# Patient Record
Sex: Female | Born: 1935 | Race: White | Hispanic: No | Marital: Married | Smoking: Never smoker
Health system: Southern US, Community
[De-identification: ages and names within clinical notes are randomized; demographics above are authoritative.]

## PROBLEM LIST (undated history)

## (undated) DIAGNOSIS — R55 Syncope and collapse: Secondary | ICD-10-CM

## (undated) DIAGNOSIS — C801 Malignant (primary) neoplasm, unspecified: Secondary | ICD-10-CM

## (undated) DIAGNOSIS — I4891 Unspecified atrial fibrillation: Secondary | ICD-10-CM

## (undated) DIAGNOSIS — I10 Essential (primary) hypertension: Secondary | ICD-10-CM

## (undated) DIAGNOSIS — E785 Hyperlipidemia, unspecified: Secondary | ICD-10-CM

## (undated) DIAGNOSIS — I2510 Atherosclerotic heart disease of native coronary artery without angina pectoris: Secondary | ICD-10-CM

## (undated) DIAGNOSIS — C50919 Malignant neoplasm of unspecified site of unspecified female breast: Secondary | ICD-10-CM

## (undated) HISTORY — DX: Malignant (primary) neoplasm, unspecified: C80.1

## (undated) HISTORY — DX: Unspecified atrial fibrillation: I48.91

## (undated) HISTORY — DX: Essential (primary) hypertension: I10

## (undated) HISTORY — DX: Syncope and collapse: R55

## (undated) HISTORY — DX: Malignant neoplasm of unspecified site of unspecified female breast: C50.919

## (undated) HISTORY — DX: Atherosclerotic heart disease of native coronary artery without angina pectoris: I25.10

## (undated) HISTORY — DX: Hyperlipidemia, unspecified: E78.5

---

## 1990-09-22 HISTORY — PX: BREAST LUMPECTOMY: SHX2

## 1994-09-22 HISTORY — PX: OTHER SURGICAL HISTORY: SHX169

## 2006-12-01 DIAGNOSIS — F411 Generalized anxiety disorder: Secondary | ICD-10-CM | POA: Diagnosis present

## 2008-01-09 ENCOUNTER — Emergency Department: Payer: Self-pay | Admitting: Emergency Medicine

## 2008-01-12 ENCOUNTER — Other Ambulatory Visit: Payer: Self-pay

## 2008-01-13 ENCOUNTER — Observation Stay: Payer: Self-pay | Admitting: Internal Medicine

## 2012-05-05 DIAGNOSIS — I482 Chronic atrial fibrillation, unspecified: Secondary | ICD-10-CM | POA: Diagnosis present

## 2012-05-05 DIAGNOSIS — I4891 Unspecified atrial fibrillation: Secondary | ICD-10-CM | POA: Diagnosis present

## 2012-07-05 ENCOUNTER — Ambulatory Visit: Payer: Self-pay | Admitting: Ophthalmology

## 2012-07-05 LAB — PROTIME-INR
INR: 1.8
Prothrombin Time: 20.9 secs — ABNORMAL HIGH (ref 11.5–14.7)

## 2012-07-12 ENCOUNTER — Ambulatory Visit: Payer: Self-pay | Admitting: Ophthalmology

## 2014-04-21 ENCOUNTER — Emergency Department: Payer: Self-pay | Admitting: Emergency Medicine

## 2014-11-27 ENCOUNTER — Encounter: Payer: Self-pay | Admitting: Cardiovascular Disease

## 2014-11-27 ENCOUNTER — Encounter (INDEPENDENT_AMBULATORY_CARE_PROVIDER_SITE_OTHER): Payer: Self-pay

## 2014-11-27 ENCOUNTER — Ambulatory Visit (INDEPENDENT_AMBULATORY_CARE_PROVIDER_SITE_OTHER): Payer: Medicare HMO | Admitting: Cardiovascular Disease

## 2014-11-27 ENCOUNTER — Ambulatory Visit: Payer: Self-pay | Admitting: Surgery

## 2014-11-27 VITALS — HR 59 | Ht 65.0 in | Wt 170.1 lb

## 2014-11-27 DIAGNOSIS — I4891 Unspecified atrial fibrillation: Secondary | ICD-10-CM

## 2014-11-27 DIAGNOSIS — T671XXD Heat syncope, subsequent encounter: Secondary | ICD-10-CM

## 2014-11-27 DIAGNOSIS — R42 Dizziness and giddiness: Secondary | ICD-10-CM

## 2014-11-27 NOTE — Patient Instructions (Addendum)
Your physician recommends that you schedule a follow-up appointment in:  12/14/14 with Dr. Acie Fredrickson   Your physician has requested that you have an echocardiogram. Echocardiography is a painless test that uses sound waves to create images of your heart. It provides your doctor with information about the size and shape of your heart and how well your heart's chambers and valves are working. This procedure takes approximately one hour. There are no restrictions for this procedure.   Your physician has recommended that you wear an event monitor. Event monitors are medical devices that record the heart's electrical activity. Doctors most often [place] these monitors to diagnose arrhythmias. Arrhythmias are problems with the speed or rhythm of the heartbeat. The monitor is a small, portable device. You can wear one while you do your normal daily activities. This is usually used to diagnose what is causing palpitations/syncope (passing out).

## 2014-11-27 NOTE — Progress Notes (Signed)
Cardiology Office Note   Date:  11/27/2014   ID:  Daisy Parks, DOB May 29, 1932, MRN [id number]  PCP:  Artist Beach, MD  Cardiologist:   Thayer Headings, MD   Chief Complaint  Patient presents with  . Atrial Fibrillation      History of Present Illness: Daisy Parks is a 81 y.o. female who presents for pre-op evaluation for right arm surgery.  She has paroxsymal atrial fib.  She gets very dizzy and has fallen several times.   She fell recently and broke her right arm .   She was found to have atrial fibrillation when she was in the ER .  Has been seen by cardiology in [place] at [facility].   She has occasional episodes of angina.  Has had stress testing in [facility].  Has had a heart cath several years ago.  Was found to have a 70% blockage somewhere.  She does not get any regular exercise. She occasionally walks around in [place] or around in a supermarket. She denies any PND orthopnea. She denies any leg swelling.  She complains of pain in her right arm due to her fracture.      Past Medical History  Diagnosis Date  . Hypertension   . Hyperlipidemia   . Syncope and collapse   . Coronary artery disease   . Atrial fibrillation   . Cancer   . Breast cancer 1192    LUMPECTOMY    Past Surgical History  Procedure Laterality Date  . Breast lumpectomy  1992    LEFT BREAST  . 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 atenolol (TENORMIN) 25 MG tablet Take 25 mg by mouth daily. TAKE 1/2- 1 WHOLE TABLET DAILY AS DIRECTED    . chlorthalidone (HYGROTON) 25 MG tablet Take 25 mg by mouth daily.    Marland Kitchen HYDROcodone-acetaminophen (VICODIN) 5-500 MG per tablet Take 1 tablet by mouth every 6 (six) hours as needed for pain. 5-325MG  DAILY PRN FOR PAIN    . PARoxetine (PAXIL) 20 MG tablet Take 20 mg by mouth daily.    . Potassium Chloride CR (MICRO-K) 8 MEQ CPCR capsule  CR Take 20 mEq by mouth daily.    Marland Kitchen warfarin (COUMADIN) 2.5 MG tablet Take 2.5 mg by mouth daily. ONE TABLET ON TUESDAYS AND TWO TABLETS EVERY OTHER DAY OF THE WEEK     No current facility-administered medications for this visit.    Allergies:   Review of patient's allergies indicates no known allergies.    Social History:  The patient  reports that she has never smoked. She does not have any smokeless tobacco history on file. She reports that she does not drink alcohol or use illicit drugs.   Family History:  The patient's family history includes CAD in her father; CVA in her father and mother; Heart attack in her sister; Hypertension in her brother.    ROS:  Please see the history of present illness.    Review of Systems: Constitutional:  denies fever, chills, diaphoresis, appetite change and fatigue.  HEENT: denies photophobia, eye pain, redness, hearing loss, ear pain, congestion, sore throat, rhinorrhea, sneezing, neck pain, neck stiffness and tinnitus.  Respiratory: denies SOB, DOE, cough,   Cardiovascular: admits to chest pain,  Gastrointestinal: denies nausea, vomiting, abdominal pain, diarrhea, constipation, blood in stool.  Genitourinary: denies dysuria, urgency, frequency, hematuria,  flank pain and difficulty urinating.  Musculoskeletal: denies  myalgias, back pain, joint swelling, arthralgias and gait problem.   Skin: denies pallor, rash and wound.  Neurological: denies dizziness, seizures, syncope, weakness, light-headedness, numbness and headaches.   Hematological: denies adenopathy, easy bruising, personal or family bleeding history.  Psychiatric/ Behavioral: denies suicidal ideation, mood changes, confusion, nervousness, sleep disturbance and agitation.       All other systems are reviewed and negative.    PHYSICAL EXAM: VS:  BP   Pulse 59  Ht 5\' 5"  (1.651 m)  Wt 170 lb 1.9 oz (77.166 kg)  BMI 28.31 kg/m2 , BMI Body mass index is 28.31 kg/(m^2).  We were not  able to get a BP. Her right arm is in a cast.  She has lymphedema in her left arm.  GEN: Well nourished, well developed, in no acute distress HEENT: normal Neck: no JVD, carotid bruits, or masses Cardiac: RRR; no murmurs, rubs, or gallops,no edema in her legs Respiratory:  clear to auscultation bilaterally, normal work of breathing GI: soft, nontender, nondistended, + BS MS: right arm is in a cast  Skin: warm and dry, no rash Neuro:  Strength and sensation are intact Psych: normal   EKG:  EKG is ordered today. The ekg ordered today demonstrates sinus brady at 59.  NS ST and T wave abnormality.    Recent Labs: No results found for requested labs within last 365 days.    Lipid Panel No results found for: CHOL, TRIG, HDL, CHOLHDL, VLDL, LDLCALC, LDLDIRECT    Wt Readings from Last 3 Encounters:  11/27/14 170 lb 1.9 oz (77.166 kg)      Other studies Reviewed: Additional studies/ records that were reviewed today include: . Review of the above records demonstrates:    ASSESSMENT AND PLAN:  1.  Preoperative assessment prior to arm surgery.  The patient has a history of moderate coronary artery disease. She told me that she has a 70% stenosis that has been treated medically. She has had several stress test. She has a history of paroxysmal atrial fibrillation and has had a monitor as well as an echocardiogram. All of these studies were performed at [facility] and I do not have access to the studies.  At this time there is no way that I can give an accurate preoperative assessment prior to her surgery. I can give the okay for the surgeons to performed surgery under a regional block which does not pose any additional cardiac risk. We'll have to assess her cardiac status at a later time.  2. Atrial fibrillation: The patient has a history of paroxysmal atrial fibrillation. She is in  sinus rhythm today - sinus bradycardia. She's had multiple episodes of falling and wonders whether  or not she is going into atrial fibrillation which results in an episode of syncope. When she has been seen in the emergency room following these falls, she is frequently in atrial fibrillation. She's had monitors and an echocardiogram performed through the [facility] medical system but I do not have records for that. We will schedule her for an echocardiogram  for further assessment.  She will eventually need a 30 day event monitor. We will probably need to get the electrophysiology team involved.  3. History of coronary artery disease: The patient has had a cardiac catheterization in the past. She told me that showed a 70% blockage that they were treating medically. She admits to having episodes of angina but cannot tell me when she  has episodes of chest pain. We'll need to consider doing further testing.    4.  Right arm fracture: The patient has been in a cast for the past 10 days. She is scheduled for surgery tomorrow morning. I've cleared her for surgery using a regional block. I have no way to assess her cardiac function or assess her preoperative risk for general anesthesia.   Current medicines are reviewed at length with the patient today.  The patient does not have concerns regarding medicines.  The following changes have been made:  no change   Disposition:   FU with me in several weeks.  I've given the okay for right arm surgery to be performed under regional block. I've ordered an echocardiogram. I've ordered a 30 day event monitor. I'll see her in the office in 2-3 weeks for follow-up visit.    Signed, Nahser, Wonda Cheng, MD  11/27/2014 4:44 PM    [facility] [address], [place], [place]  [address] Phone: [phone]; Fax: [phone]

## 2014-12-08 ENCOUNTER — Ambulatory Visit: Payer: Self-pay | Admitting: Cardiovascular Disease

## 2015-01-05 ENCOUNTER — Ambulatory Visit
Admit: 2015-01-05 | Disposition: A | Discharge status: Home / Self Care | Payer: Self-pay | Attending: Surgery | Admitting: Surgery

## 2015-01-09 NOTE — Op Note (Signed)
PATIENT NAME:  Daisy Parks, Daisy Parks MR#:  [id number] DATE OF BIRTH:  1932/05/12  DATE OF PROCEDURE:  07/12/2012  PREOPERATIVE DIAGNOSIS:  Cataract, left eye.    POSTOPERATIVE DIAGNOSIS:  Cataract, left eye.  PROCEDURE PERFORMED:  Extracapsular cataract extraction using phacoemulsification with placement of an Alcon [id number], 22.5-diopter posterior chamber lens, serial # [id number].   SURGEON:  Loura Back. Andreya Lacks, MD  ASSISTANT:  None.  ANESTHESIA:  4% lidocaine and 0.75% Marcaine in a 50/50 mixture with 10 units/mL of Hylenex added, given as a peribulbar.  ANESTHESIOLOGIST:  Dr. Andree Elk   COMPLICATIONS:  None.  ESTIMATED BLOOD LOSS:  Less than 1 mL.  DESCRIPTION OF PROCEDURE:  The patient was brought to the operating room and given a peribulbar block.  The patient was then prepped and draped in the usual fashion.  The vertical rectus muscles were imbricated using 5-0 silk sutures.  These sutures were then clamped to the sterile drapes as bridle sutures.  A limbal peritomy was performed extending two clock hours and hemostasis was obtained with cautery.  A partial thickness scleral groove was made at the surgical limbus and dissected anteriorly in a lamellar dissection using an Alcon crescent knife.  The anterior chamber was entered supero-temporally with a Superblade and through the lamellar dissection with a 2.6 mm keratome.  DisCoVisc was used to replace the aqueous and a continuous tear capsulorrhexis was carried out.  Hydrodissection and hydrodelineation were carried out with balanced salt and a 27 gauge canula.  The nucleus was rotated to confirm the effectiveness of the hydrodissection.  Phacoemulsification was carried out using a divide-and-conquer technique.  Total ultrasound time was 1 minute and 58 seconds with an average power of 22.7 percent, CDE 44.14.  Irrigation/aspiration was used to remove the residual cortex.  DisCoVisc was used to inflate the capsule and the internal incision  was enlarged to 3 mm with the crescent knife.  The intraocular lens was folded and inserted into the capsular bag using the AcrySert delivery system.  Irrigation/aspiration was used to remove the residual DisCoVisc.  Miostat was injected into the anterior chamber through the paracentesis track to inflate the anterior chamber and induce miosis.  The wound was checked for leaks and none were found. The conjunctiva was closed with cautery and the bridle sutures were removed.  Two drops of 0.3% Vigamox were placed on the eye.   An eye shield was placed on the eye.  The patient was discharged to the recovery room in good condition.  ____________________________ Loura Back Vineeth Fell, MD sad:drc D: 07/12/2012 13:33:19 ET T: 07/12/2012 13:47:34 ET JOB#: [id number]  cc: Remo Lipps A. Niajah Sipos, MD, <Dictator> Martie Lee MD ELECTRONICALLY SIGNED 07/19/2012 13:54

## 2016-09-04 ENCOUNTER — Emergency Department
Admission: EM | Admit: 2016-09-04 | Discharge: 2016-09-04 | Disposition: A | Discharge status: Home / Self Care | Payer: Medicare HMO | Attending: Emergency Medicine | Admitting: Emergency Medicine

## 2016-09-04 DIAGNOSIS — Z7901 Long term (current) use of anticoagulants: Secondary | ICD-10-CM | POA: Diagnosis not present

## 2016-09-04 DIAGNOSIS — I10 Essential (primary) hypertension: Secondary | ICD-10-CM | POA: Diagnosis not present

## 2016-09-04 DIAGNOSIS — I2510 Atherosclerotic heart disease of native coronary artery without angina pectoris: Secondary | ICD-10-CM | POA: Diagnosis not present

## 2016-09-04 DIAGNOSIS — R51 Headache: Secondary | ICD-10-CM | POA: Diagnosis present

## 2016-09-04 DIAGNOSIS — Z79899 Other long term (current) drug therapy: Secondary | ICD-10-CM | POA: Diagnosis not present

## 2016-09-04 DIAGNOSIS — Z853 Personal history of malignant neoplasm of breast: Secondary | ICD-10-CM | POA: Insufficient documentation

## 2016-09-04 DIAGNOSIS — Z5321 Procedure and treatment not carried out due to patient leaving prior to being seen by health care provider: Secondary | ICD-10-CM | POA: Insufficient documentation

## 2016-09-04 NOTE — ED Triage Notes (Signed)
Pt c/o headache and high blood pressure that began today. Pt taking BP medications, complaint with medications. Pt alert and oriented X4, active, cooperative, pt in NAD. RR even and unlabored, color WNL.

## 2019-04-25 ENCOUNTER — Other Ambulatory Visit: Payer: Self-pay | Admitting: Urology

## 2019-04-26 ENCOUNTER — Other Ambulatory Visit: Payer: Self-pay | Admitting: Urology

## 2019-04-26 DIAGNOSIS — N2889 Other specified disorders of kidney and ureter: Secondary | ICD-10-CM

## 2020-09-09 ENCOUNTER — Other Ambulatory Visit: Payer: Self-pay | Admitting: Nurse Practitioner

## 2020-09-09 DIAGNOSIS — U071 COVID-19: Secondary | ICD-10-CM

## 2020-09-09 NOTE — Progress Notes (Signed)
I connected by phone with Daisy Parks on 09/09/2020 at 2:38 PM to discuss the potential use of a new treatment for mild to moderate COVID-19 viral infection in non-hospitalized patients.  This patient is a 86 y.o. female that meets the FDA criteria for Emergency Use Authorization of COVID monoclonal antibody casirivimab/imdevimab, bamlanivimab/etesevimab, or sotrovimab.  Has a (+) direct SARS-CoV-2 viral test result  Has mild or moderate COVID-19   Is NOT hospitalized due to COVID-19  Is within 10 days of symptom onset  Has at least one of the high risk factor(s) for progression to severe COVID-19 and/or hospitalization as defined in EUA.  Specific high risk criteria : Older age (>/= 84 yo), BMI > 25 and Cardiovascular disease or hypertension   I have spoken and communicated the following to the patient or parent/caregiver regarding COVID monoclonal antibody treatment:  1. FDA has authorized the emergency use for the treatment of mild to moderate COVID-19 in adults and pediatric patients with positive results of direct SARS-CoV-2 viral testing who are 78 years of age and older weighing at least 40 kg, and who are at high risk for progressing to severe COVID-19 and/or hospitalization.  2. The significant known and potential risks and benefits of COVID monoclonal antibody, and the extent to which such potential risks and benefits are unknown.  3. Information on available alternative treatments and the risks and benefits of those alternatives, including clinical trials.  4. Patients treated with COVID monoclonal antibody should continue to self-isolate and use infection control measures (e.g., wear mask, isolate, social distance, avoid sharing personal items, clean and disinfect "high touch" surfaces, and frequent handwashing) according to CDC guidelines.   5. The patient or parent/caregiver has the option to accept or refuse COVID monoclonal antibody treatment.  After reviewing this  information with the patient, the patient has agreed to receive one of the available covid 19 monoclonal antibodies and will be provided an appropriate fact sheet prior to infusion. Jobe Gibbon, NP 09/09/2020 2:38 PM

## 2020-09-11 ENCOUNTER — Ambulatory Visit (HOSPITAL_COMMUNITY)
Admission: RE | Admit: 2020-09-11 | Discharge: 2020-09-11 | Disposition: A | Discharge status: Home / Self Care | Payer: Medicare Other | Source: Ambulatory Visit | Attending: Pulmonary Disease | Admitting: Pulmonary Disease

## 2020-09-11 DIAGNOSIS — U071 COVID-19: Secondary | ICD-10-CM | POA: Diagnosis present

## 2020-09-11 DIAGNOSIS — Z23 Encounter for immunization: Secondary | ICD-10-CM | POA: Diagnosis not present

## 2020-09-11 MED ORDER — METHYLPREDNISOLONE SODIUM SUCC 125 MG IJ SOLR: 125.0000 mg | Freq: Once | INTRAMUSCULAR | Status: DC | PRN

## 2020-09-11 MED ORDER — EPINEPHRINE 0.3 MG/0.3ML IJ SOAJ: 0.3000 mg | Freq: Once | INTRAMUSCULAR | Status: DC | PRN

## 2020-09-11 MED ORDER — SODIUM CHLORIDE 0.9 % IV SOLN: INTRAVENOUS | Status: DC | PRN

## 2020-09-11 MED ORDER — FAMOTIDINE IN NACL 20-0.9 MG/50ML-% IV SOLN: 20.0000 mg | Freq: Once | INTRAVENOUS | Status: DC | PRN

## 2020-09-11 MED ORDER — DIPHENHYDRAMINE HCL 50 MG/ML IJ SOLN: 50.0000 mg | Freq: Once | INTRAMUSCULAR | Status: DC | PRN

## 2020-09-11 MED ORDER — ALBUTEROL SULFATE HFA 108 (90 BASE) MCG/ACT IN AERS: 2.0000 | INHALATION_SPRAY | Freq: Once | RESPIRATORY_TRACT | Status: DC | PRN

## 2020-09-11 MED ORDER — SODIUM CHLORIDE 0.9 % IV SOLN: Freq: Once | INTRAVENOUS | Status: AC

## 2020-09-11 NOTE — Progress Notes (Signed)
  Diagnosis: COVID-19  Physician: Dr. Wright  Procedure: Cov[facility] Infusion Clinic Med: bamlanivimab\etesevimab infusion - Provided patient with bamlanimivab\etesevimab fact sheet for patients, parents and caregivers prior to infusion.  Complications: No immediate complications noted.  Discharge: Discharged home   Costantino Kohlbeck R Indea Dearman 09/11/2020    

## 2020-09-11 NOTE — Progress Notes (Signed)
Patient reviewed Fact Sheet for Patients, Parents, and Caregivers for Emergency Use Authorization (EUA) of bamlanivimab and etesevimab for the Treatment of Coronavirus. Patient also reviewed and is agreeable to the estimated cost of treatment. Patient is agreeable to proceed.   

## 2020-09-11 NOTE — Discharge Instructions (Signed)
If you have any questions or concerns after the infusion please call the [facility] Provider on call at [phone]. This number is ONLY intended for your use regarding questions or concerns about the infusion post-treatment side-effects.  Please do not provide this number to others for use. For return to work notes please contact your primary care provider.   If someone you know is interested in receiving treatment please have them call the [place] hotline at [phone].   What types of side effects do monoclonal antibody drugs cause?  Common side effects  In general, the more common side effects caused by monoclonal antibody drugs include: . Allergic reactions, such as hives or itching . Flu-like signs and symptoms, including chills, fatigue, fever, and muscle aches and pains . Nausea, vomiting . Diarrhea . Skin rashes . Low blood pressure   The CDC is recommending patients who receive monoclonal antibody treatments wait at least 90 days before being vaccinated.  Currently, there are no data on the safety and efficacy of mRNA COVID-19 vaccines in persons who received monoclonal antibodies or convalescent plasma as part of COVID-19 treatment. Based on the estimated half-life of such therapies as well as evidence suggesting that reinfection is uncommon in the 90 days after initial infection, vaccination should be deferred for at least 90 days, as a precautionary measure until additional information becomes available, to avoid interference of the antibody treatment with vaccine-induced immune responses.   10 Things You Can Do to Manage Your COVID-19 Symptoms at Home If you have possible or confirmed COVID-19: 1. Stay home from work and school. And stay away from other public places. If you must go out, avoid using any kind of public transportation, ridesharing, or taxis. 2. Monitor your symptoms carefully. If your symptoms get worse, call your healthcare provider immediately. 3. Get  rest and stay hydrated. 4. If you have a medical appointment, call the healthcare provider ahead of time and tell them that you have or may have COVID-19. 5. For medical emergencies, call 911 and notify the dispatch personnel that you have or may have COVID-19. 6. Cover your cough and sneezes with a tissue or use the inside of your elbow. 7. Wash your hands often with soap and water for at least 20 seconds or clean your hands with an alcohol-based hand sanitizer that contains at least 60% alcohol. 8. As much as possible, stay in a specific room and away from other people in your home. Also, you should use a separate bathroom, if available. If you need to be around other people in or outside of the home, wear a mask. 9. Avoid sharing personal items with other people in your household, like dishes, towels, and bedding. 10. Clean all surfaces that are touched often, like counters, tabletops, and doorknobs. Use household cleaning sprays or wipes according to the label instructions. [email or web address] 03/23/2019 This information is not intended to replace advice given to you by your health care provider. Make sure you discuss any questions you have with your health care provider. Document Revised: 08/25/2019 Document Reviewed: 08/25/2019 Elsevier Patient Education  [place].

## 2020-09-19 ENCOUNTER — Emergency Department: Payer: Medicare HMO

## 2020-09-19 ENCOUNTER — Other Ambulatory Visit: Payer: Self-pay

## 2020-09-19 ENCOUNTER — Emergency Department
Admission: EM | Admit: 2020-09-19 | Discharge: 2020-09-19 | Disposition: A | Discharge status: Home / Self Care | Payer: Medicare HMO | Attending: Emergency Medicine | Admitting: Emergency Medicine

## 2020-09-19 DIAGNOSIS — Z7901 Long term (current) use of anticoagulants: Secondary | ICD-10-CM | POA: Diagnosis not present

## 2020-09-19 DIAGNOSIS — Z853 Personal history of malignant neoplasm of breast: Secondary | ICD-10-CM | POA: Insufficient documentation

## 2020-09-19 DIAGNOSIS — Z79899 Other long term (current) drug therapy: Secondary | ICD-10-CM | POA: Diagnosis not present

## 2020-09-19 DIAGNOSIS — I10 Essential (primary) hypertension: Secondary | ICD-10-CM | POA: Diagnosis not present

## 2020-09-19 DIAGNOSIS — J189 Pneumonia, unspecified organism: Secondary | ICD-10-CM | POA: Diagnosis not present

## 2020-09-19 DIAGNOSIS — R42 Dizziness and giddiness: Secondary | ICD-10-CM | POA: Diagnosis not present

## 2020-09-19 DIAGNOSIS — I2510 Atherosclerotic heart disease of native coronary artery without angina pectoris: Secondary | ICD-10-CM | POA: Diagnosis not present

## 2020-09-19 DIAGNOSIS — R059 Cough, unspecified: Secondary | ICD-10-CM | POA: Diagnosis present

## 2020-09-19 LAB — CBC
HCT: 43.7 % (ref 36.0–46.0)
Hemoglobin: 14.9 g/dL (ref 12.0–15.0)
MCH: 30.5 pg (ref 26.0–34.0)
MCHC: 34.1 g/dL (ref 30.0–36.0)
MCV: 89.4 fL (ref 80.0–100.0)
Platelets: 363 10*3/uL (ref 150–400)
RBC: 4.89 MIL/uL (ref 3.87–5.11)
RDW: 12.2 % (ref 11.5–15.5)
WBC: 8.1 10*3/uL (ref 4.0–10.5)
nRBC: 0 % (ref 0.0–0.2)

## 2020-09-19 LAB — PROTIME-INR
INR: 2.7 — ABNORMAL HIGH (ref 0.8–1.2)
Prothrombin Time: 28 seconds — ABNORMAL HIGH (ref 11.4–15.2)

## 2020-09-19 LAB — BASIC METABOLIC PANEL
Anion gap: 10 (ref 5–15)
BUN: 11 mg/dL (ref 8–23)
CO2: 24 mmol/L (ref 22–32)
Calcium: 9.1 mg/dL (ref 8.9–10.3)
Chloride: 103 mmol/L (ref 98–111)
Creatinine, Ser: 0.71 mg/dL (ref 0.44–1.00)
GFR, Estimated: >60 mL/min (ref >60)
Glucose, Bld: 147 mg/dL — ABNORMAL HIGH (ref 70–99)
Potassium: 3.7 mmol/L (ref 3.5–5.1)
Sodium: 137 mmol/L (ref 135–145)

## 2020-09-19 MED ORDER — CEFDINIR 300 MG PO CAPS: 300.0000 mg | ORAL_CAPSULE | Freq: Two times a day (BID) | ORAL | 0 refills | Status: AC

## 2020-09-19 MED ORDER — AZITHROMYCIN 500 MG PO TABS: 500.0000 mg | ORAL_TABLET | Freq: Every day | ORAL | 0 refills | Status: AC

## 2020-09-19 NOTE — ED Provider Notes (Signed)
[facility] Emergency Department Provider Note   ____________________________________________    I have reviewed the triage vital signs and the nursing notes.   HISTORY  Chief Complaint Hypotension and Dizziness     HPI Daisy Parks is a 86 y.o. female who presents for evaluation because of a low blood pressure measurement this morning.  Patient reports she was feeling fatigued and also complains that she has had a cough for 1 to 2 days.  She checked her blood pressure and found her systolic blood pressure to be in the low 80s.  She denies chest pain to me.  No fevers or chills.  Recently recovered from COVID-19 1 to 2 weeks ago.  Has been vaccinated.  Past Medical History:  Diagnosis Date  . Atrial fibrillation ([place])   . Breast cancer ([place]) 1192   LUMPECTOMY  . Cancer ([place])   . Coronary artery disease   . Hyperlipidemia   . Hypertension   . Syncope and collapse     There are no problems to display for this patient.   Past Surgical History:  Procedure Laterality Date  . BREAST LUMPECTOMY  1992   LEFT BREAST  . 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 Authorizing Provider  azithromycin (ZITHROMAX) 500 MG tablet Take 1 tablet (500 mg total) by mouth daily for 7 days. Take 1 tablet daily for 3 days. 09/19/20 09/26/20 Yes Lavonia Drafts, MD  cefdinir (OMNICEF) 300 MG capsule Take 1 capsule (300 mg total) by mouth 2 (two) times daily for 7 days. 09/19/20 09/26/20 Yes Lavonia Drafts, MD  atenolol (TENORMIN) 25 MG tablet Take 25 mg by mouth daily. TAKE 1/2- 1 WHOLE TABLET DAILY AS DIRECTED    [provider]  chlorthalidone (HYGROTON) 25 MG tablet Take 25 mg by mouth daily.    [provider]  HYDROcodone-acetaminophen (VICODIN) 5-500 MG per tablet Take 1 tablet by mouth every 6 (six) hours as needed for pain.  5-325MG  DAILY PRN FOR PAIN    [provider]  PARoxetine (PAXIL) 20 MG tablet Take 20 mg by mouth daily.    [provider]  Potassium Chloride CR (MICRO-K) 8 MEQ CPCR capsule CR Take 20 mEq by mouth daily.    [provider]  warfarin (COUMADIN) 2.5 MG tablet Take 2.5 mg by mouth daily. ONE TABLET ON TUESDAYS AND TWO TABLETS EVERY OTHER DAY OF THE WEEK    [provider]     Allergies Patient has no known allergies.  Family History  Problem Relation Age of Onset  . CVA Mother   . CAD Father   . CVA Father   . Heart attack Sister   . Hypertension Brother     Social History Social History   Tobacco Use  . Smoking status: Never Smoker  . Smokeless tobacco: Never Used  Substance Use Topics  . Alcohol use: No    Alcohol/week: 0.0 standard drinks  . Drug use: No    Review of Systems  Constitutional: No fever/chills Eyes: No visual changes.  ENT: No sore throat. Cardiovascular: Denies chest pain. Respiratory: Mild cough Gastrointestinal: No abdominal pain.    Genitourinary: Negative for dysuria. Musculoskeletal: Negative for back pain. Skin: Negative for rash. Neurological: Negative for headaches   ____________________________________________   PHYSICAL EXAM:  VITAL SIGNS: ED Triage Vitals  Enc Vitals Group  BP 09/19/20 1017 111/66     Pulse Rate 09/19/20 1017 62     Resp 09/19/20 1017 16     Temp 09/19/20 1017 98.8 F (37.1 C)     Temp Source 09/19/20 1017 Oral     SpO2 09/19/20 1020 99 %     Weight 09/19/20 1018 73.5 kg (162 lb)     Height 09/19/20 1018 1.651 m (5\' 5" )     Head Circumference --      Peak Flow --      Pain Score 09/19/20 1018 5     Pain Loc --      Pain Edu? --      Excl. in [place]? --     Constitutional: Alert and oriented. No acute distress.   Nose: No congestion/rhinnorhea. Mouth/Throat: Mucous membranes are moist.    Cardiovascular: Normal rate, regular rhythm. Grossly normal heart sounds.   Good peripheral circulation. Respiratory: Normal respiratory effort.  No retractions. Lungs CTAB. Gastrointestinal: Soft and nontender. No distention.  No CVA tenderness.  Musculoskeletal: No lower extremity tenderness nor edema.  Warm and well perfused Neurologic:  Normal speech and language. No gross focal neurologic deficits are appreciated.  Skin:  Skin is warm, dry and intact. No rash noted. Psychiatric: Mood and affect are normal. Speech and behavior are normal.  ____________________________________________   LABS (all labs ordered are listed, but only abnormal results are displayed)  Labs Reviewed  BASIC METABOLIC PANEL - Abnormal; Notable for the following components:      Result Value   Glucose, Bld 147 (*)    All other components within normal limits  PROTIME-INR - Abnormal; Notable for the following components:   Prothrombin Time 28.0 (*)    INR 2.7 (*)    All other components within normal limits  CBC   ____________________________________________  EKG  ED ECG REPORT I, 09/21/20, the attending physician, personally viewed and interpreted this ECG.  Date: 09/19/2020  Rhythm: normal sinus rhythm QRS Axis: normal Intervals: normal ST/T Wave abnormalities: normal Narrative Interpretation: no evidence of acute ischemia  ____________________________________________  RADIOLOGY  Chest x-ray viewed by me, possible opacity ____________________________________________   PROCEDURES  Procedure(s) performed: No  Procedures   Critical Care performed: No ____________________________________________   INITIAL IMPRESSION / ASSESSMENT AND PLAN / ED COURSE  Pertinent labs & imaging results that were available during my care of the patient were reviewed by me and considered in my medical decision making (see chart for details).  Patient presents with complaints of low blood pressure this morning, blood pressures here in the emergency department are normal.   Lab work is reassuring, normal white blood cell count.  Heart rate normal, afebrile, respiratory rate and oxygenation normal.  Recently recovered from COVID-19.  Chest x-ray demonstrates mild focal airspace opacities in the right upper lung, question whether there is a residual from COVID-19 or new CAP.  Given description of new cough suspect no CAP.  She also describes some fatigue.  Will start on p.o. antibiotics.  Blood pressure has been rechecked multiple times and has been normal here in the emergency department.  Recommend focus on hydration, rest, strict return precautions of any worsening.  Patient and son agree with plan    ____________________________________________   FINAL CLINICAL IMPRESSION(S) / ED DIAGNOSES  Final diagnoses:  Dizziness  Community acquired pneumonia, unspecified laterality        Note:  This document was prepared using Dragon voice recognition software and may include unintentional dictation errors.  Lavonia Drafts, MD 09/19/20 1420

## 2020-09-19 NOTE — ED Triage Notes (Signed)
Pt states she has not been feeling well since yesterday and this morning she felt dizzy and checked her b/p at home 88/60.Marland Kitchen pt is a/ox4 at present. States she has a hx of a-fib but does not feel like its going fast..

## 2021-03-14 ENCOUNTER — Other Ambulatory Visit: Payer: Self-pay

## 2021-03-14 ENCOUNTER — Encounter: Payer: Self-pay | Admitting: Emergency Medicine

## 2021-03-14 ENCOUNTER — Emergency Department: Payer: Medicare HMO

## 2021-03-14 ENCOUNTER — Emergency Department
Admission: EM | Admit: 2021-03-14 | Discharge: 2021-03-14 | Disposition: A | Discharge status: Home / Self Care | Payer: Medicare HMO | Attending: Emergency Medicine | Admitting: Emergency Medicine

## 2021-03-14 DIAGNOSIS — Z853 Personal history of malignant neoplasm of breast: Secondary | ICD-10-CM | POA: Insufficient documentation

## 2021-03-14 DIAGNOSIS — I10 Essential (primary) hypertension: Secondary | ICD-10-CM | POA: Insufficient documentation

## 2021-03-14 DIAGNOSIS — Z79899 Other long term (current) drug therapy: Secondary | ICD-10-CM | POA: Insufficient documentation

## 2021-03-14 DIAGNOSIS — I2510 Atherosclerotic heart disease of native coronary artery without angina pectoris: Secondary | ICD-10-CM | POA: Diagnosis not present

## 2021-03-14 DIAGNOSIS — R42 Dizziness and giddiness: Secondary | ICD-10-CM | POA: Insufficient documentation

## 2021-03-14 LAB — COMPREHENSIVE METABOLIC PANEL
ALT: 20 U/L (ref 0–44)
AST: 23 U/L (ref 15–41)
Albumin: 4 g/dL (ref 3.5–5.0)
Alkaline Phosphatase: 80 U/L (ref 38–126)
Anion gap: 7 (ref 5–15)
BUN: 13 mg/dL (ref 8–23)
CO2: 25 mmol/L (ref 22–32)
Calcium: 9.1 mg/dL (ref 8.9–10.3)
Chloride: 103 mmol/L (ref 98–111)
Creatinine, Ser: 0.77 mg/dL (ref 0.44–1.00)
GFR, Estimated: >60 mL/min (ref >60)
Glucose, Bld: 151 mg/dL — ABNORMAL HIGH (ref 70–99)
Potassium: 3.6 mmol/L (ref 3.5–5.1)
Sodium: 135 mmol/L (ref 135–145)
Total Bilirubin: 0.6 mg/dL (ref 0.3–1.2)
Total Protein: 6.7 g/dL (ref 6.5–8.1)

## 2021-03-14 LAB — URINALYSIS, COMPLETE (UACMP) WITH MICROSCOPIC
Bilirubin Urine: NEGATIVE
Glucose, UA: NEGATIVE mg/dL
Hgb urine dipstick: NEGATIVE
Ketones, ur: NEGATIVE mg/dL
Nitrite: NEGATIVE
Protein, ur: NEGATIVE mg/dL
Specific Gravity, Urine: 1.005 (ref 1.005–1.030)
pH: 5 (ref 5.0–8.0)

## 2021-03-14 LAB — CBC
HCT: 43.5 % (ref 36.0–46.0)
Hemoglobin: 14.7 g/dL (ref 12.0–15.0)
MCH: 30.7 pg (ref 26.0–34.0)
MCHC: 33.8 g/dL (ref 30.0–36.0)
MCV: 90.8 fL (ref 80.0–100.0)
Platelets: 259 10*3/uL (ref 150–400)
RBC: 4.79 MIL/uL (ref 3.87–5.11)
RDW: 12.4 % (ref 11.5–15.5)
WBC: 11.2 10*3/uL — ABNORMAL HIGH (ref 4.0–10.5)
nRBC: 0 % (ref 0.0–0.2)

## 2021-03-14 LAB — TROPONIN I (HIGH SENSITIVITY): Troponin I (High Sensitivity): 4 ng/L (ref <18)

## 2021-03-14 MED ORDER — SODIUM CHLORIDE 0.9 % IV BOLUS
500.0000 mL | Freq: Once | INTRAVENOUS | Status: AC
  Administered 2021-03-14: 500 mL via INTRAVENOUS

## 2021-03-14 NOTE — ED Triage Notes (Signed)
Pt comes into the ED via POV c/o dizziness that started last night and some SHOB.  Pt denies any falls with the dizziness.  Pt currently has even and unlabored respirations.  Pt does admit to some increased fatigue as well.

## 2021-03-14 NOTE — ED Notes (Signed)
Patient returned from CT

## 2021-03-14 NOTE — ED Notes (Signed)
Patient transported to CT 

## 2021-03-14 NOTE — ED Provider Notes (Signed)
[facility] Emergency Department Provider Note   ____________________________________________    I have reviewed the triage vital signs and the nursing notes.   HISTORY  Chief Complaint Dizziness     HPI Daisy Parks is a 87 y.o. female with history of atrial fibrillation who presents with complaints of dizziness.  Patient reports that her blood pressure has been going up and down recently.  She does take sotalol for her atrial fibrillation, no change in dose recently.  No chest pain or palpitations reported.  Currently is feeling improved however earlier today had sensation of dizziness that was more vertiginous than lightheadedness.  No fevers or chills  Past Medical History:  Diagnosis Date   Atrial fibrillation ([place])    Breast cancer ([place]) 1192   LUMPECTOMY   Cancer ([place])    Coronary artery disease    Hyperlipidemia    Hypertension    Syncope and collapse     There are no problems to display for this patient.   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 Authorizing Provider  atenolol (TENORMIN) 25 MG tablet Take 25 mg by mouth daily. TAKE 1/2- 1 WHOLE TABLET DAILY AS DIRECTED    [provider]  chlorthalidone (HYGROTON) 25 MG tablet Take 25 mg by mouth daily.    [provider]  HYDROcodone-acetaminophen (VICODIN) 5-500 MG per tablet Take 1 tablet by mouth every 6 (six) hours as needed for pain. 5-325MG  DAILY PRN FOR PAIN    [provider]  PARoxetine (PAXIL) 20 MG tablet Take 20 mg by mouth daily.    [provider]  Potassium Chloride CR (MICRO-K) 8 MEQ CPCR capsule CR Take 20 mEq by mouth daily.    [provider]  warfarin (COUMADIN) 2.5 MG tablet Take 2.5 mg by mouth daily. ONE  TABLET ON TUESDAYS AND TWO TABLETS EVERY OTHER DAY OF THE WEEK    [provider]     Allergies Patient has no known allergies.  Family History  Problem Relation Age of Onset   CVA Mother    CAD Father    CVA Father    Heart attack Sister    Hypertension Brother     Social History Social History   Tobacco Use   Smoking status: Never   Smokeless tobacco: Never  Substance Use Topics   Alcohol use: No    Alcohol/week: 0.0 standard drinks   Drug use: No    Review of Systems  Constitutional: No fever/chills Eyes: No visual changes.  ENT: No sore throat. Cardiovascular: Denies chest pain. Respiratory: Denies shortness of breath. Gastrointestinal: No abdominal pain.  No nausea, no vomiting.   Genitourinary: Negative for dysuria. Musculoskeletal: Negative for back pain. Skin: Negative for rash. Neurological: Negative for headaches or weakness   ____________________________________________   PHYSICAL EXAM:  VITAL SIGNS: ED Triage Vitals  Enc Vitals Group     BP 03/14/21 1518 (!) 181/84     Pulse Rate 03/14/21 1518 66     Resp 03/14/21 1518 20     Temp 03/14/21 1518 (!) 97.4 F (36.3 C)     Temp Source 03/14/21 1518 Oral     SpO2 03/14/21 1518 97 %     Weight 03/14/21 1513  71.7 kg (158 lb)     Height 03/14/21 1513 1.651 m (5\' 5" )     Head Circumference --      Peak Flow --      Pain Score 03/14/21 1513 1     Pain Loc --      Pain Edu? --      Excl. in [place]? --     Constitutional: Alert and oriented. No acute distress. Pleasant and interactive  Nose: No congestion/rhinnorhea. Mouth/Throat: Mucous membranes are moist.   Neck:  Painless ROM Cardiovascular: Normal rate, regular rhythm. Grossly normal heart sounds.  Good peripheral circulation. Respiratory: Normal respiratory effort.  No retractions. Lungs CTAB. Gastrointestinal: Soft and nontender. No distention.  No CVA tenderness.  Musculoskeletal: No lower extremity tenderness nor edema.  Warm  and well perfused Neurologic:  Normal speech and language. No gross focal neurologic deficits are appreciated.  Skin:  Skin is warm, dry and intact. No rash noted. Psychiatric: Mood and affect are normal. Speech and behavior are normal.  ____________________________________________   LABS (all labs ordered are listed, but only abnormal results are displayed)  Labs Reviewed  CBC - Abnormal; Notable for the following components:      Result Value   WBC 11.2 (*)    All other components within normal limits  URINALYSIS, COMPLETE (UACMP) WITH MICROSCOPIC - Abnormal; Notable for the following components:   Color, Urine YELLOW (*)    APPearance CLEAR (*)    Leukocytes,Ua SMALL (*)    Bacteria, UA RARE (*)    All other components within normal limits  COMPREHENSIVE METABOLIC PANEL - Abnormal; Notable for the following components:   Glucose, Bld 151 (*)    All other components within normal limits  TROPONIN I (HIGH SENSITIVITY)   ____________________________________________  EKG  ED ECG REPORT I, Lavonia Drafts, the attending physician, personally viewed and interpreted this ECG.  Date: 03/14/2021  Rhythm: normal sinus rhythm QRS Axis: normal Intervals: normal ST/T Wave abnormalities:  nonspecific change Narrative Interpretation: no evidence of acute ischemia  ____________________________________________  RADIOLOGY  CT head reviewed by me, no acute abnormality ____________________________________________   PROCEDURES  Procedure(s) performed: No  Procedures   Critical Care performed: No ____________________________________________   INITIAL IMPRESSION / ASSESSMENT AND PLAN / ED COURSE  Pertinent labs & imaging results that were available during my care of the patient were reviewed by me and considered in my medical decision making (see chart for details).   Patient presents with dizziness as described above, improved here in the emergency department.  Exam is  overall reassuring, no neurodeficits.  Suspect episode of vertigo.  Could also be medication side effect however no new medications reported.  Lab work is reassuring, CT head unremarkable, EKG is unremarkable  Patient feeling better after IV fluids.  Appropriate for discharge at this time with outpatient follow-up    ____________________________________________   FINAL CLINICAL IMPRESSION(S) / ED DIAGNOSES  Final diagnoses:  Dizziness        Note:  This document was prepared using Dragon voice recognition software and may include unintentional dictation errors.    Lavonia Drafts, MD 03/14/21 1910

## 2021-03-14 NOTE — ED Notes (Signed)
Pt reports history of A Fib and states that she takes numerous medications for cardiac management. She reports intermittent dizziness today and near-syncope PTA. No recent falls, no injury. A/O x 4 on arrival. Ambulatory with cane/walking stick with no obvious impairment to balance or gait.

## 2022-02-07 ENCOUNTER — Emergency Department: Payer: Medicare HMO

## 2022-02-07 ENCOUNTER — Emergency Department
Admission: EM | Admit: 2022-02-07 | Discharge: 2022-02-07 | Disposition: A | Discharge status: Home / Self Care | Payer: Medicare HMO | Attending: Emergency Medicine | Admitting: Emergency Medicine

## 2022-02-07 ENCOUNTER — Encounter: Payer: Self-pay | Admitting: Emergency Medicine

## 2022-02-07 ENCOUNTER — Other Ambulatory Visit: Payer: Self-pay

## 2022-02-07 DIAGNOSIS — R0602 Shortness of breath: Secondary | ICD-10-CM | POA: Insufficient documentation

## 2022-02-07 DIAGNOSIS — R718 Other abnormality of red blood cells: Secondary | ICD-10-CM | POA: Diagnosis not present

## 2022-02-07 DIAGNOSIS — I2510 Atherosclerotic heart disease of native coronary artery without angina pectoris: Secondary | ICD-10-CM | POA: Insufficient documentation

## 2022-02-07 DIAGNOSIS — N3000 Acute cystitis without hematuria: Secondary | ICD-10-CM

## 2022-02-07 DIAGNOSIS — Z950 Presence of cardiac pacemaker: Secondary | ICD-10-CM | POA: Diagnosis not present

## 2022-02-07 DIAGNOSIS — Z853 Personal history of malignant neoplasm of breast: Secondary | ICD-10-CM | POA: Diagnosis not present

## 2022-02-07 DIAGNOSIS — R42 Dizziness and giddiness: Secondary | ICD-10-CM

## 2022-02-07 DIAGNOSIS — R110 Nausea: Secondary | ICD-10-CM | POA: Diagnosis not present

## 2022-02-07 DIAGNOSIS — R600 Localized edema: Secondary | ICD-10-CM | POA: Diagnosis not present

## 2022-02-07 DIAGNOSIS — Z7901 Long term (current) use of anticoagulants: Secondary | ICD-10-CM | POA: Diagnosis not present

## 2022-02-07 DIAGNOSIS — I10 Essential (primary) hypertension: Secondary | ICD-10-CM | POA: Insufficient documentation

## 2022-02-07 LAB — BASIC METABOLIC PANEL
Anion gap: 7 (ref 5–15)
BUN: 13 mg/dL (ref 8–23)
CO2: 29 mmol/L (ref 22–32)
Calcium: 9.2 mg/dL (ref 8.9–10.3)
Chloride: 104 mmol/L (ref 98–111)
Creatinine, Ser: 0.79 mg/dL (ref 0.44–1.00)
GFR, Estimated: >60 mL/min (ref >60)
Glucose, Bld: 114 mg/dL — ABNORMAL HIGH (ref 70–99)
Potassium: 3.6 mmol/L (ref 3.5–5.1)
Sodium: 140 mmol/L (ref 135–145)

## 2022-02-07 LAB — TROPONIN I (HIGH SENSITIVITY)
Troponin I (High Sensitivity): 6 ng/L (ref <18)
Troponin I (High Sensitivity): 6 ng/L (ref <18)

## 2022-02-07 LAB — URINALYSIS, ROUTINE W REFLEX MICROSCOPIC
Bilirubin Urine: NEGATIVE
Glucose, UA: NEGATIVE mg/dL
Hgb urine dipstick: NEGATIVE
Ketones, ur: NEGATIVE mg/dL
Nitrite: NEGATIVE
Protein, ur: NEGATIVE mg/dL
Specific Gravity, Urine: 1.01 (ref 1.005–1.030)
pH: 6 (ref 5.0–8.0)

## 2022-02-07 LAB — CBC
HCT: 49.9 % — ABNORMAL HIGH (ref 36.0–46.0)
Hemoglobin: 16.5 g/dL — ABNORMAL HIGH (ref 12.0–15.0)
MCH: 30.1 pg (ref 26.0–34.0)
MCHC: 33.1 g/dL (ref 30.0–36.0)
MCV: 90.9 fL (ref 80.0–100.0)
Platelets: 304 10*3/uL (ref 150–400)
RBC: 5.49 MIL/uL — ABNORMAL HIGH (ref 3.87–5.11)
RDW: 12.3 % (ref 11.5–15.5)
WBC: 10.5 10*3/uL (ref 4.0–10.5)
nRBC: 0 % (ref 0.0–0.2)

## 2022-02-07 LAB — PROTIME-INR
INR: 2 — ABNORMAL HIGH (ref 0.8–1.2)
Prothrombin Time: 22.7 seconds — ABNORMAL HIGH (ref 11.4–15.2)

## 2022-02-07 MED ORDER — SODIUM CHLORIDE 0.9 % IV BOLUS
500.0000 mL | Freq: Once | INTRAVENOUS | Status: AC
  Administered 2022-02-07: 500 mL via INTRAVENOUS

## 2022-02-07 NOTE — ED Provider Notes (Signed)
[facility] Provider Note    Event Date/Time   First MD Initiated Contact with Patient 02/07/22 1242     (approximate)   History   Dizziness   HPI  Daisy Parks is a 88 y.o. female with a history of atrial fibrillation on warfarin, CAD, hypertension, hyperlipidemia, and remote history of breast cancer who presents with dizziness and shortness of breath, acute onset yesterday evening and persisting today.  The patient states that she feels short of breath at rest and with exertion and states it feels somewhat similar to when she has had anxiety attacks before.  She denies associated chest pain, palpitations, fever, or cough.  She has had some leg swelling recently.  In addition she reports dizziness since last night described as feeling lightheaded, not as spinning or vertigo.  She denies associated headache.  She has had some nausea but no vomiting and denies abdominal pain or diarrhea.  The patient denies any recent changes in her blood pressure medications.  She does states she has had episodes like this before but less severe.  The family member reports that the patient was seen in the walk-in clinic, diagnosed with a UTI, and sent to the ED for further evaluation.   Physical Exam   Triage Vital Signs: ED Triage Vitals  Enc Vitals Group     BP 02/07/22 1040 105/63     Pulse Rate 02/07/22 1040 77     Resp 02/07/22 1040 20     Temp 02/07/22 1040 98.4 F (36.9 C)     Temp Source 02/07/22 1040 Oral     SpO2 02/07/22 1040 99 %     Weight 02/07/22 1041 150 lb (68 kg)     Height 02/07/22 1041 '5\' 5"'$  (1.651 m)     Head Circumference --      Peak Flow --      Pain Score 02/07/22 1041 4     Pain Loc --      Pain Edu? --      Excl. in [place]? --     Most recent vital signs: Vitals:   02/07/22 1040  BP: 105/63  Pulse: 77  Resp: 20  Temp: 98.4 F (36.9 C)  SpO2: 99%     General: Alert and oriented, well appearing for age, no distress.   CV:  Good  peripheral perfusion.  Normal heart sounds. Resp:  Normal effort.  Lungs CTAB. Abd:  Soft and nontender.  No distention.  Other:  EOMI.  PERRLA.  No facial droop.  Motor intact in all extremities.  Normal coordination.  Slightly dry mucous membranes.  Trace bilateral lower extremity edema.   ED Results / Procedures / Treatments   Labs (all labs ordered are listed, but only abnormal results are displayed) Labs Reviewed  BASIC METABOLIC PANEL - Abnormal; Notable for the following components:      Result Value   Glucose, Bld 114 (*)    All other components within normal limits  CBC - Abnormal; Notable for the following components:   RBC 5.49 (*)    Hemoglobin 16.5 (*)    HCT 49.9 (*)    All other components within normal limits  PROTIME-INR - Abnormal; Notable for the following components:   Prothrombin Time 22.7 (*)    INR 2.0 (*)    All other components within normal limits  URINALYSIS, ROUTINE W REFLEX MICROSCOPIC - Abnormal; Notable for the following components:   Color, Urine YELLOW (*)  APPearance HAZY (*)    Leukocytes,Ua MODERATE (*)    Bacteria, UA RARE (*)    All other components within normal limits  TROPONIN I (HIGH SENSITIVITY)  TROPONIN I (HIGH SENSITIVITY)     EKG  ED ECG REPORT I, Arta Silence, the attending physician, personally viewed and interpreted this ECG.  Date: 02/07/2022 EKG Time: 1046 Rate: 89 Rhythm: Atrial fibrillation QRS Axis: normal Intervals: Prolonged QTc ST/T Wave abnormalities: Nonspecific T wave abnormalities Narrative Interpretation: no evidence of acute ischemia; no significant change when compared to EKG of 03/14/2021    RADIOLOGY  Chest x-ray: I independently viewed and interpreted the images; there is no focal consolidation or edema  PROCEDURES:  Critical Care performed: No  Procedures   MEDICATIONS ORDERED IN ED: Medications  sodium chloride 0.9 % bolus 500 mL (0 mLs Intravenous Stopped 02/07/22 1523)      IMPRESSION / MDM / ASSESSMENT AND PLAN / ED COURSE  I reviewed the triage vital signs and the nursing notes.  88 year old female with PMH as noted above presents with some shortness of breath and lightheadedness since last night associated with nausea.  She also has some low back pain but no recent fall or trauma.  She apparently was seen in the walk-in clinic today, told she had a UTI, and sent to the ED.    I reviewed the past medical records and do not see any note from earlier today.  The patient most recent encounter was from 01/09/2022 for an interrogation of her pacemaker.  She had a cardiology visit on 12/20/2021 and has a history of somewhat labile blood pressures with occasional hypotension but was recommended to continue amlodipine and metoprolol at her current doses at that time.  She is on sotalol and warfarin for the atrial fibrillation.  On exam the patient is overall well-appearing.  Her vital signs are normal except for borderline low blood pressure.  Lungs are clear.  Neuro exam is nonfocal.  The physical exam is otherwise unremarkable.  Chest x-ray shows no acute findings.  EKG shows nonspecific T wave abnormalities unchanged from prior.  Differential diagnosis includes, but is not limited to, dehydration, orthostatic hypotension, side effect of her blood pressure medications, rapid atrial fibrillation which may have resolved, electrolyte abnormality, other metabolic disturbance, UTI.  I do not suspect pneumonia or other respiratory etiology given the negative chest x-ray, and lack of hypoxia or other acute symptoms.  I do not suspect ACS given the lack of chest pain and lack of EKG changes.  We will obtain basic labs, troponins x2, urinalysis, give fluids, check orthostatic vital signs, and reassess.  The patient is on the cardiac monitor to evaluate for evidence of arrhythmia and/or significant heart rate changes.  ----------------------------------------- 3:40 PM on  02/07/2022 -----------------------------------------  Troponins are negative x2.  Electrolytes are normal.  INR is therapeutic.  CBC shows no leukocytosis.  It does show elevated hemoglobin consistent with mild hemoconcentration.  Urinalysis confirms findings consistent with a UTI.  Per the patient and her family member they have already been prescribed an antibiotic from the outpatient clinic and it is ready to pick up at [facility].  The patient was mildly orthostatic but her blood pressure still remained in the normal range when she stood up.  She is feeling significantly better now after fluids.  She feels well to go home.  I did consider whether the patient may require admission due to her dizziness and the urinary tract infection, however at this time  given the overall reassuring lab work-up, stable vital signs, and the patient clinically feeling significantly better, she is appropriate for discharge home.  I gave the patient and her family member thorough return precautions and they expressed understanding  FINAL CLINICAL IMPRESSION(S) / ED DIAGNOSES   Final diagnoses:  Dizziness  Acute cystitis without hematuria     Rx / DC Orders   ED Discharge Orders     None        Note:  This document was prepared using Dragon voice recognition software and may include unintentional dictation errors.    Arta Silence, MD 02/07/22 [phone]

## 2022-02-07 NOTE — Discharge Instructions (Addendum)
Take the antibiotic that was prescribed to you at the walk-in.  Follow-up with your doctor next week.  Return to the emergency department for new, worsening, or persistent severe weakness or dizziness, difficulty breathing, vomiting, abdominal pain, flank pain, fever, or any other new or worsening symptoms that concern you.

## 2022-02-07 NOTE — ED Triage Notes (Signed)
Pt via POV from home. Pt c/o low BP, dizziness, SOB, and lower back pain that started last night. Denies any pain in her chest pain. Pt states she could not sleep because of this. Denies any pain in her chest pain. Pt takes Warfarin for her a fib. Pt is A&Ox4 and NAD.

## 2022-04-02 ENCOUNTER — Observation Stay
Admission: EM | Admit: 2022-04-02 | Discharge: 2022-04-03 | Disposition: A | Discharge status: Home / Self Care | Payer: Medicare HMO | Attending: Osteopathic Medicine | Admitting: Osteopathic Medicine

## 2022-04-02 ENCOUNTER — Other Ambulatory Visit: Payer: Self-pay

## 2022-04-02 ENCOUNTER — Emergency Department: Payer: Medicare HMO

## 2022-04-02 ENCOUNTER — Encounter: Payer: Self-pay | Admitting: Internal Medicine

## 2022-04-02 DIAGNOSIS — I4891 Unspecified atrial fibrillation: Principal | ICD-10-CM | POA: Insufficient documentation

## 2022-04-02 DIAGNOSIS — F411 Generalized anxiety disorder: Secondary | ICD-10-CM | POA: Diagnosis present

## 2022-04-02 DIAGNOSIS — R791 Abnormal coagulation profile: Secondary | ICD-10-CM | POA: Diagnosis not present

## 2022-04-02 DIAGNOSIS — R7989 Other specified abnormal findings of blood chemistry: Secondary | ICD-10-CM | POA: Diagnosis present

## 2022-04-02 DIAGNOSIS — I480 Paroxysmal atrial fibrillation: Secondary | ICD-10-CM | POA: Insufficient documentation

## 2022-04-02 DIAGNOSIS — I10 Essential (primary) hypertension: Secondary | ICD-10-CM | POA: Insufficient documentation

## 2022-04-02 DIAGNOSIS — F419 Anxiety disorder, unspecified: Secondary | ICD-10-CM | POA: Diagnosis present

## 2022-04-02 DIAGNOSIS — Z79899 Other long term (current) drug therapy: Secondary | ICD-10-CM | POA: Diagnosis not present

## 2022-04-02 DIAGNOSIS — R000 Tachycardia, unspecified: Secondary | ICD-10-CM | POA: Diagnosis present

## 2022-04-02 DIAGNOSIS — Z20822 Contact with and (suspected) exposure to covid-19: Secondary | ICD-10-CM | POA: Diagnosis not present

## 2022-04-02 DIAGNOSIS — R8281 Pyuria: Secondary | ICD-10-CM | POA: Diagnosis present

## 2022-04-02 DIAGNOSIS — I482 Chronic atrial fibrillation, unspecified: Secondary | ICD-10-CM | POA: Diagnosis present

## 2022-04-02 DIAGNOSIS — R002 Palpitations: Secondary | ICD-10-CM | POA: Diagnosis present

## 2022-04-02 DIAGNOSIS — R0602 Shortness of breath: Secondary | ICD-10-CM | POA: Insufficient documentation

## 2022-04-02 DIAGNOSIS — J810 Acute pulmonary edema: Secondary | ICD-10-CM | POA: Diagnosis not present

## 2022-04-02 LAB — COMPREHENSIVE METABOLIC PANEL
ALT: 21 U/L (ref 0–44)
AST: 23 U/L (ref 15–41)
Albumin: 4 g/dL (ref 3.5–5.0)
Alkaline Phosphatase: 70 U/L (ref 38–126)
Anion gap: 3 — ABNORMAL LOW (ref 5–15)
BUN: 11 mg/dL (ref 8–23)
CO2: 28 mmol/L (ref 22–32)
Calcium: 8.9 mg/dL (ref 8.9–10.3)
Chloride: 109 mmol/L (ref 98–111)
Creatinine, Ser: 0.88 mg/dL (ref 0.44–1.00)
GFR, Estimated: >60 mL/min (ref >60)
Glucose, Bld: 125 mg/dL — ABNORMAL HIGH (ref 70–99)
Potassium: 3.6 mmol/L (ref 3.5–5.1)
Sodium: 140 mmol/L (ref 135–145)
Total Bilirubin: 0.9 mg/dL (ref 0.3–1.2)
Total Protein: 7.2 g/dL (ref 6.5–8.1)

## 2022-04-02 LAB — CBC
HCT: 45.8 % (ref 36.0–46.0)
Hemoglobin: 14.7 g/dL (ref 12.0–15.0)
MCH: 30 pg (ref 26.0–34.0)
MCHC: 32.1 g/dL (ref 30.0–36.0)
MCV: 93.5 fL (ref 80.0–100.0)
Platelets: 298 10*3/uL (ref 150–400)
RBC: 4.9 MIL/uL (ref 3.87–5.11)
RDW: 12.7 % (ref 11.5–15.5)
WBC: 9.6 10*3/uL (ref 4.0–10.5)
nRBC: 0 % (ref 0.0–0.2)

## 2022-04-02 LAB — BRAIN NATRIURETIC PEPTIDE: B Natriuretic Peptide: 874.6 pg/mL — ABNORMAL HIGH (ref 0.0–100.0)

## 2022-04-02 LAB — PROTIME-INR
INR: 3.3 — ABNORMAL HIGH (ref 0.8–1.2)
INR: 3.1 — ABNORMAL HIGH (ref 0.8–1.2)
Prothrombin Time: 33.3 seconds — ABNORMAL HIGH (ref 11.4–15.2)
Prothrombin Time: 31.4 seconds — ABNORMAL HIGH (ref 11.4–15.2)

## 2022-04-02 LAB — PROCALCITONIN: Procalcitonin: <0.1 ng/mL

## 2022-04-02 LAB — TROPONIN I (HIGH SENSITIVITY)
Troponin I (High Sensitivity): 7 ng/L (ref <18)
Troponin I (High Sensitivity): 7 ng/L (ref <18)

## 2022-04-02 MED ORDER — ONDANSETRON HCL 4 MG/2ML IJ SOLN: 4.0000 mg | Freq: Four times a day (QID) | INTRAMUSCULAR | Status: DC | PRN

## 2022-04-02 MED ORDER — ACETAMINOPHEN 325 MG RE SUPP
650.0000 mg | Freq: Four times a day (QID) | RECTAL | Status: DC | PRN
Start: 2022-04-02 — End: 2022-04-03

## 2022-04-02 MED ORDER — SOTALOL HCL 80 MG PO TABS
160.0000 mg | ORAL_TABLET | Freq: Once | ORAL | Status: AC
  Administered 2022-04-02: 160 mg via ORAL
  Filled 2022-04-02: qty 2

## 2022-04-02 MED ORDER — METOPROLOL TARTRATE 5 MG/5ML IV SOLN
2.5000 mg | Freq: Once | INTRAVENOUS | Status: AC
  Administered 2022-04-02: 2.5 mg via INTRAVENOUS
  Filled 2022-04-02: qty 5

## 2022-04-02 MED ORDER — LABETALOL HCL 5 MG/ML IV SOLN
0.5000 mg/min | Status: DC
  Administered 2022-04-02 (×2): 0.5 mg/min via INTRAVENOUS
  Filled 2022-04-02 (×2): qty 80

## 2022-04-02 MED ORDER — SOTALOL HCL 80 MG PO TABS
160.0000 mg | ORAL_TABLET | Freq: Two times a day (BID) | ORAL | Status: DC
  Administered 2022-04-02 – 2022-04-03 (×2): 160 mg via ORAL
  Filled 2022-04-02 (×2): qty 2

## 2022-04-02 MED ORDER — MIRTAZAPINE 15 MG PO TABS
15.0000 mg | ORAL_TABLET | Freq: Every day | ORAL | Status: DC
  Administered 2022-04-02: 15 mg via ORAL
  Filled 2022-04-02: qty 1

## 2022-04-02 MED ORDER — WARFARIN - PHARMACIST DOSING INPATIENT
Freq: Every day | Status: DC
  Filled 2022-04-02: qty 1

## 2022-04-02 MED ORDER — AMLODIPINE BESYLATE 5 MG PO TABS
5.0000 mg | ORAL_TABLET | Freq: Every day | ORAL | Status: DC
  Administered 2022-04-02 – 2022-04-03 (×2): 5 mg via ORAL
  Filled 2022-04-02 (×2): qty 1

## 2022-04-02 MED ORDER — SOTALOL HCL 80 MG PO TABS: 160.0000 mg | ORAL_TABLET | Freq: Two times a day (BID) | ORAL | Status: DC

## 2022-04-02 MED ORDER — FUROSEMIDE 10 MG/ML IJ SOLN
40.0000 mg | Freq: Once | INTRAMUSCULAR | Status: AC
  Administered 2022-04-02: 40 mg via INTRAVENOUS
  Filled 2022-04-02: qty 4

## 2022-04-02 MED ORDER — HYDRALAZINE HCL 20 MG/ML IJ SOLN: 5.0000 mg | Freq: Four times a day (QID) | INTRAMUSCULAR | Status: DC | PRN

## 2022-04-02 MED ORDER — SODIUM CHLORIDE 0.9 % IV SOLN
1.0000 g | INTRAVENOUS | Status: DC
  Administered 2022-04-02: 1 g via INTRAVENOUS
  Filled 2022-04-02: qty 10

## 2022-04-02 MED ORDER — METOPROLOL SUCCINATE ER 50 MG PO TB24
25.0000 mg | ORAL_TABLET | Freq: Two times a day (BID) | ORAL | Status: DC
  Administered 2022-04-02 – 2022-04-03 (×2): 25 mg via ORAL
  Filled 2022-04-02 (×3): qty 1

## 2022-04-02 MED ORDER — FUROSEMIDE 10 MG/ML IJ SOLN
40.0000 mg | Freq: Once | INTRAMUSCULAR | Status: AC
  Administered 2022-04-03: 40 mg via INTRAVENOUS
  Filled 2022-04-02: qty 4

## 2022-04-02 MED ORDER — ONDANSETRON HCL 4 MG PO TABS: 4.0000 mg | ORAL_TABLET | Freq: Four times a day (QID) | ORAL | Status: DC | PRN

## 2022-04-02 MED ORDER — ACETAMINOPHEN 325 MG PO TABS: 650.0000 mg | ORAL_TABLET | Freq: Four times a day (QID) | ORAL | Status: DC | PRN

## 2022-04-02 NOTE — Assessment & Plan Note (Addendum)
-   Warfarin per pharmacy ordered - Resumed home metoprolol succinate 25 mg p.o. twice daily, sotalol 160 mg p.o. twice daily - Continue labetalol gtt. - Admit to PCU - Goal heart rate is less than 120 bpm

## 2022-04-02 NOTE — Assessment & Plan Note (Signed)
-   Resumed home mirtazapine 15 mg nightly - Patient states she is no longer taking buspirone 7.5 mg 3 times daily due to ineffectiveness - Recommend continued outpatient follow-up with PCP and or behavioral health specialist

## 2022-04-02 NOTE — Assessment & Plan Note (Addendum)
-   Present on admission - Urine culture added to prior collection - Added ceftriaxone 1 g IV daily, a.m. team to follow-up on urine culture to determine if continued antibiotic requirements

## 2022-04-02 NOTE — Assessment & Plan Note (Addendum)
-   Patient takes amlodipine 5 mg daily, metoprolol succinate 25 mg p.o. twice daily - These have been resumed

## 2022-04-02 NOTE — Hospital Course (Addendum)
[name] is an 88 year old female with history of paroxysmal atrial fibrillation on warfarin, sotalol, metoprolol, hypertension, anxiety, hyperlipidemia, who presents emergency department for chief concerns of palpitation and anxiety.  She reports that she has taken her home dose of metoprolol 25 mg p.o. dose at home prior to presentation and did not take her home dose of sotalol 160 mg p.o. prior to ED presentation.  Initial vitals in the emergency department showed temperature 97.6, respiration rate of 22, initial heart rate of 122, blood pressure 131/108, SPO2 of 94% on room air.  Serum sodium 140, potassium 3.6, chloride 109, bicarb 28, BUN of 11, serum creatinine of 0.88, nonfasting blood glucose 125, GFR greater than 60, WBC 9.6, hemoglobin 14.7, platelets of 298.  High sensitive troponin was 7.  UA showed moderate leukocytes.  ED treatment: Furosemide 40 mg IV one-time dose ordered and pending administration, metoprolol tartrate 2.5 mg IV x2, sotalol 160 mg p.o. one-time dose.  Patient's heart rate improved to 125 with the above medications.  EDP also started patient on labetalol gtt and pending administration.

## 2022-04-02 NOTE — ED Notes (Signed)
Patient resting in bed with spontaneous regular respirations on RA. Denies chest pain or shortness of breath. Labetalol gtt infusing at 0.'5mg'$ /min, 6 ml/hr. Afib with frequent multifocal PVC's on monitor, often in 5+ beat runs. Neomia Glass messaged via secure chat to notify and EKG obtained.

## 2022-04-02 NOTE — Assessment & Plan Note (Addendum)
-   BNP resulted as 875 on admission - With new endorsement of worsening shortness of breath with exertion - She denies swelling in her lower extremities - Complete echo ordered as the last complete echo ordered that I see per care everywhere was in 2021 - Strict I's and O's and status post furosemide 40 mg IV per EDP - I have ordered additional furosemide 40 mg IV for 04/03/2022, one time dose

## 2022-04-02 NOTE — Assessment & Plan Note (Addendum)
-   Etiology work-up in progress, differentials include atrial fibrillation versus infectious etiology - Continue labetalol gtt. - Check urine culture and procalcitonin given UA was positive for moderate leukocytes portable chest x-ray was read as small left pleural effusion on left basilar consolidation; check BNP - If procalcitonin is positive, we will initiate antibiotic therapy; if urine culture is positive, a.m. team will initiate antibiotic therapy - Admit to progressive cardiac, observation

## 2022-04-02 NOTE — H&P (Addendum)
History and Physical   [name] FTD:[id number] DOB: December 15, 1931 DOA: 04/02/2022  PCP: Daisy Never, MD Outpatient Specialists: Dr. Delight Hoh, [facility] cardiology Patient coming from: Home  I have personally briefly reviewed patient's old medical records in [place].  Chief Concern: Palpitations and anxiety  HPI: [name] is an 88 year old female with history of paroxysmal atrial fibrillation on warfarin, sotalol, metoprolol, hypertension, anxiety, hyperlipidemia, who presents emergency department for chief concerns of palpitation and anxiety.  She reports that she has taken her home dose of metoprolol 25 mg p.o. dose at home prior to presentation and did not take her home dose of sotalol 160 mg p.o. prior to ED presentation.  Initial vitals in the emergency department showed temperature 97.6, respiration rate of 22, initial heart rate of 122, blood pressure 131/108, SPO2 of 94% on room air.  Serum sodium 140, potassium 3.6, chloride 109, bicarb 28, BUN of 11, serum creatinine of 0.88, nonfasting blood glucose 125, GFR greater than 60, WBC 9.6, hemoglobin 14.7, platelets of 298.  High sensitive troponin was 7.  UA showed moderate leukocytes.  ED treatment: Furosemide 40 mg IV one-time dose ordered and pending administration, metoprolol tartrate 2.5 mg IV x2, sotalol 160 mg p.o. one-time dose.  Patient's heart rate improved to 125 with the above medications.  EDP also started patient on labetalol gtt and pending administration.  At bedside, she is awake, alert to self, age, current year, current location and correctly did not state that the year is 2023 and her current location is [facility].  She reports shortness of breath, for two or three days at home, she feels irritated and ill.   She denies new cough, fever, known sick contacts. She endorses nausea and vomited and she has been vomiting up the thick fiber drinks, that is yellow, white  and food particles.   She denies dysuria, hematuria. She endorses increased urinary frequency over the last two days. She denies diarrhea, syncope, LOC, swelling of her lower extremities. She endorses weakness and stumbling around.  She reports the shortness of breath is worse with exertion  Of note: She endorses finishing an antibiotic about 1 month ago for a urinary tract infection.  She does not remember who prescribed it or the antibiotic in question.  Social history: Her son Octavia Bruckner lives with her. She denies smoking or tobacco use. She denies etoh use, recreational drug use. 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, [place].  ROS: Constitutional: no weight change, no fever ENT/Mouth: no sore throat, no rhinorrhea Eyes: no eye pain, no vision changes Cardiovascular: no chest pain, + dyspnea,  no edema, + palpitations Respiratory: no cough, no sputum, no wheezing Gastrointestinal: + nausea, + vomiting, no diarrhea, no constipation Genitourinary: no urinary incontinence, no dysuria, no hematuria, + urine frequency  Musculoskeletal: no arthralgias, no myalgias Skin: no skin lesions, no pruritus, Neuro: + weakness, no loss of consciousness, no syncope Psych: + anxiety, no depression, + decrease appetite Heme/Lymph: no bruising, no bleeding  ED Course: Discussed with emergency medicine provider, patient requiring hospitalization for chief concerns of A-fib difficult to control.  Assessment/Plan  Principal Problem:   Palpitations Active Problems:   Essential hypertension   PAF (paroxysmal atrial fibrillation) (HCC)   Anxiety   Pyuria   Elevated brain natriuretic peptide (BNP) level   Assessment and Plan:  * Palpitations - Etiology work-up in progress, differentials include atrial fibrillation versus infectious etiology - Continue  labetalol gtt. - Check urine culture and procalcitonin given UA was positive for moderate leukocytes portable chest x-ray  was read as small left pleural effusion on left basilar consolidation; check BNP - If procalcitonin is positive, we will initiate antibiotic therapy; if urine culture is positive, a.m. team will initiate antibiotic therapy - Admit to progressive cardiac, observation  Elevated brain natriuretic peptide (BNP) level - BNP resulted as 875 on admission - With new endorsement of worsening shortness of breath with exertion - She denies swelling in her lower extremities - Complete echo ordered as the last complete echo ordered that I see per care everywhere was in 2021 - Strict I's and O's and status post furosemide 40 mg IV per EDP - I have ordered additional furosemide 40 mg IV for 04/03/2022, one time dose  Pyuria - Present on admission - Urine culture added to prior collection - Added ceftriaxone 1 g IV daily, a.m. team to follow-up on urine culture to determine if continued antibiotic requirements  Anxiety - Resumed home mirtazapine 15 mg nightly - Patient states she is no longer taking buspirone 7.5 mg 3 times daily due to ineffectiveness - Recommend continued outpatient follow-up with PCP and or behavioral health specialist  PAF (paroxysmal atrial fibrillation) ([place]) - Warfarin per pharmacy ordered - Resumed home metoprolol succinate 25 mg p.o. twice daily, sotalol 160 mg p.o. twice daily - Continue labetalol gtt. - Admit to PCU - Goal heart rate is less than 120 bpm  Essential hypertension - Patient takes amlodipine 5 mg daily, metoprolol succinate 25 mg p.o. twice daily - These have been resumed  I was not able to find an outpatient or inpatient clinic note regarding a diagnosis of UTI within the the last 1-2 months.  Chart reviewed.   Complete echo on 05/14/2020: Read as left ventricle is normal in size and mildly increased wall thickness.  The left ventricular systolic function is normal, LVEF is visually estimated at greater than 55%, mitral annular calcification is present  (mild).  Mild mitral valve regurgitation.  Left atrium is mildly dilated in size.  Left ventricle is normal in size, with normal systolic function.  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 She has Parks used smokeless tobacco. She reports that she does not drink alcohol and does not use drugs.  Allergies  Allergen Reactions   Diltiazem     Other reaction(s): Dizziness dizzy dizzy    Hydrochlorothiazide     Other reaction(s): Dizziness   Chlorthalidone Other (See Comments)    Other reaction(s): Other (See Comments) Exacerbates afib Exacerbates afib    Pravastatin     Other reaction(s): Other (See Comments), Other (See Comments) Other reaction(s): MUSCLE PAIN Muscle pain  Other reaction(s): MUSCLE PAIN Muscle pain     Venlafaxine  Other reaction(s): Other (See Comments) Light headed Light headed    Doxazosin Palpitations    and Hypotension and Hypotension    Enalapril Itching    Other reaction(s): Other (See Comments), Other (See Comments) Cough  Cough     Losartan Palpitations   Mometasone Rash   Family History  Problem Relation Age of Onset   CVA Mother    CAD Father    CVA Father    Heart attack Sister    Hypertension Brother    Family history: Family history reviewed and  not pertinent.  Prior to Admission medications   Medication Sig Start Date End Date Taking? Authorizing Provider  amLODipine (NORVASC) 5 MG tablet Take 5 mg by mouth daily. 03/21/22  Yes [provider]  metoprolol succinate (TOPROL-XL) 25 MG 24 hr tablet Take 25 mg by mouth 2 (two) times daily. 02/13/22  Yes [provider]  mirtazapine (REMERON) 15 MG tablet Take 15 mg by mouth at bedtime. 02/21/22  Yes [provider]  sotalol (BETAPACE) 160 MG tablet Take 160 mg by mouth 2 (two) times daily. 02/13/22  Yes [provider]  warfarin (COUMADIN) 2.5 MG tablet Take 2.5 mg by mouth daily. ONE TABLET ON TUESDAYS AND TWO TABLETS EVERY OTHER DAY OF THE WEEK   Yes [provider]  busPIRone (BUSPAR) 7.5 MG tablet Take 7.5 mg by mouth 3 (three) times daily. 12/17/21   [provider]   Physical Exam: Vitals:   04/02/22 1130 04/02/22 1200 04/02/22 1230 04/02/22 1403  BP: (!) 149/82 (!) 143/101 134/90 (!) 144/119  Pulse: (!) 114 87 (!) 112   Resp: (!) 23 (!) 23 (!) 23   Temp:      TempSrc:      SpO2: 96% 95% 94%   Weight:      Height:       Constitutional: appears age-appropriate, frail, NAD, calm, comfortable Eyes: PERRL, lids and conjunctivae normal ENMT: Mucous membranes are moist. Posterior pharynx clear of any exudate or lesions. Age-appropriate dentition.  Mild bilateral hearing loss Neck: normal, supple, no masses, no thyromegaly Respiratory: clear to auscultation bilaterally, no wheezing, no crackles. Normal respiratory effort. No accessory muscle use.  Cardiovascular: Regular rate and rhythm, no murmurs / rubs / gallops. No extremity edema. 2+ pedal pulses. No carotid bruits.  Abdomen: no tenderness, no masses palpated, no hepatosplenomegaly. Bowel sounds positive.  Musculoskeletal: no clubbing / cyanosis. No joint deformity upper and lower extremities. Good ROM, no contractures, no atrophy. Normal muscle tone.  Skin: no rashes, lesions,  ulcers. No induration Neurologic: Sensation intact. Strength 5/5 in all 4.  Psychiatric: Normal judgment and insight. Alert and oriented x 3. Normal mood.   EKG: independently reviewed, showing atrial fibrillation with rate of 100, QTc 490  Chest x-ray on Admission: I personally reviewed and I agree with radiologist reading as below.  DG Chest Portable 1 View  Result Date: 04/02/2022 CLINICAL DATA:  SOB EXAM: PORTABLE CHEST 1 VIEW COMPARISON:  None Available. FINDINGS: Small left pleural effusion with overlying left basilar opacities. Mild diffuse interstitial opacities. No visible pneumothorax. Similar cardiomediastinal silhouette. Similar position of a right subclavian approach cardiac rhythm maintenance device. Left axillary and/or chest wall clips. IMPRESSION: 1. Small left pleural effusion with overlying left basilar consolidation and/or atelectasis. 2. Mild diffuse interstitial opacities, suspicious for mild interstitial edema. Electronically Signed   By: Margaretha Sheffield M.D.   On: 04/02/2022 11:00    Labs on Admission: I have personally reviewed following labs  CBC: Recent Labs  Lab 04/02/22 1005  WBC 9.6  HGB 14.7  HCT 45.8  MCV 93.5  PLT 254   Basic Metabolic Panel: Recent Labs  Lab 04/02/22 1005  NA 140  K 3.6  CL 109  CO2 28  GLUCOSE 125*  BUN 11  CREATININE 0.88  CALCIUM 8.9   GFR: Estimated Creatinine Clearance: 40.5 mL/min (by C-G formula based on SCr of 0.88 mg/dL).  Liver Function Tests: Recent Labs  Lab 04/02/22 1005  AST 23  ALT 21  ALKPHOS 70  BILITOT 0.9  PROT 7.2  ALBUMIN 4.0   Urine analysis:    Component Value Date/Time   COLORURINE YELLOW (A) 02/07/2022 1520   APPEARANCEUR HAZY (A) 02/07/2022 1520   LABSPEC 1.010 02/07/2022 1520   PHURINE 6.0 02/07/2022 1520   GLUCOSEU NEGATIVE 02/07/2022 1520   [place] 02/07/2022 [place] 02/07/2022 [place] 02/07/2022 1520   PROTEINUR NEGATIVE  02/07/2022 1520   NITRITE NEGATIVE 02/07/2022 1520   LEUKOCYTESUR MODERATE (A) 02/07/2022 1520   Dr. Tobie Poet [facility]  If 7PM-7AM, please contact overnight-coverage provider If 7AM-7PM, please contact day coverage provider [email or web address]  04/02/2022, 3:05 PM

## 2022-04-02 NOTE — Consult Note (Addendum)
ANTICOAGULATION CONSULT NOTE - Initial Consult  Pharmacy Consult for warfarin Indication: atrial fibrillation  Allergies  Allergen Reactions   Diltiazem     Other reaction(s): Dizziness dizzy dizzy    Hydrochlorothiazide     Other reaction(s): Dizziness   Chlorthalidone Other (See Comments)    Other reaction(s): Other (See Comments) Exacerbates afib Exacerbates afib    Pravastatin     Other reaction(s): Other (See Comments), Other (See Comments) Other reaction(s): MUSCLE PAIN Muscle pain  Other reaction(s): MUSCLE PAIN Muscle pain     Venlafaxine     Other reaction(s): Other (See Comments) Light headed Light headed    Doxazosin Palpitations    and Hypotension and Hypotension    Enalapril Itching    Other reaction(s): Other (See Comments), Other (See Comments) Cough  Cough     Losartan Palpitations   Mometasone Rash    Patient Measurements: Height: '5\' 5"'$  (165.1 cm) Weight: 68 kg (149 lb 14.6 oz) IBW/kg (Calculated) : 57  Vital Signs: Temp: 97.6 F (36.4 C) (07/12 0959) Temp Source: Oral (07/12 0959) BP: 134/90 (07/12 1230) Pulse Rate: 112 (07/12 1230)  Labs: Recent Labs    04/02/22 1005  HGB 14.7  HCT 45.8  PLT 298  CREATININE 0.88  TROPONINIHS 7    Estimated Creatinine Clearance: 40.5 mL/min (by C-G formula based on SCr of 0.88 mg/dL).   Medical History: Past Medical History:  Diagnosis Date   Atrial fibrillation ([place])    Breast cancer ([place]) 1192   LUMPECTOMY   Cancer ([place])    Coronary artery disease    Hyperlipidemia    Hypertension    Syncope and collapse     Medications:  Home regimen: Warfarin 2.'5mg'$  Thursday, '5mg'$  rest of week (32.'5mg'$ /week) Last dose of warfarin was yesterday, 7/11 at 1200 per patient  Assessment: Daisy Parks is a 88 y.o. female with a past medical history of paroxysmal atrial fibrillation on Coumadin sotalol metoprolol, hypertension, hyperlipidemia, anxiety. Pharmacy consulted for inpatient monitoring and  continuation of warfarin inpatient for atrial fibrillation  Goal of Therapy: INR 2-3 Monitor platelets by anticoagulation protocol: Yes   Date INR Dose  7/12 3.1 HOLD    Plan:  INR supratherapeutic. Hold warfarin today Continue to monitor INR daily with AM labs   Darrick Penna 04/02/2022,1:52 PM

## 2022-04-02 NOTE — ED Provider Notes (Signed)
[facility] Provider Note    Event Date/Time   First MD Initiated Contact with Patient 04/02/22 1032     (approximate)  History   Chief Complaint: Atrial Fibrillation  HPI  Daisy Parks is a 88 y.o. female with a past medical history of paroxysmal atrial fibrillation on Coumadin sotalol metoprolol, hypertension, hyperlipidemia, anxiety, presents to the emergency department for palpitations and anxiety symptoms.  According to the patient she has atrial fibrillation that goes in and out, states usually her heart rate is around 80 or 90 but last night it was 120.  Patient states she is feeling palpitations and was feeling somewhat short of breath as well she called her cardiologist this morning and was referred to the emergency department.  Patient denies any chest pain or discomfort.  Patient states she took her morning medications but did not take her sotalol yet.  Upon arrival patient appears to be in atrial fibrillation around 120 bpm.  Physical Exam   Triage Vital Signs: ED Triage Vitals  Enc Vitals Group     BP 04/02/22 0948 (!) 130/91     Pulse Rate 04/02/22 0948 87     Resp 04/02/22 0948 18     Temp 04/02/22 0948 98.3 F (36.8 C)     Temp Source 04/02/22 0948 Oral     SpO2 04/02/22 0948 96 %     Weight 04/02/22 0951 149 lb 14.6 oz (68 kg)     Height 04/02/22 0951 '5\' 5"'$  (1.651 m)     Head Circumference --      Peak Flow --      Pain Score 04/02/22 0950 4     Pain Loc --      Pain Edu? --      Excl. in [place]? --     Most recent vital signs: Vitals:   04/02/22 0954 04/02/22 0959  BP: (!) 144/100 (!) 131/108  Pulse: (!) 110 (!) 122  Resp: 20 (!) 22  Temp:  97.6 F (36.4 C)  SpO2: 96% 94%    General: Awake, no distress.  CV:  Good peripheral perfusion.  Irregular rhythm rate around 120. Resp:  Normal effort.  Equal breath sounds bilaterally.  Abd:  No distention.  Soft, nontender.  No rebound or guarding.   ED Results / Procedures /  Treatments   EKG  EKG viewed and interpreted by myself shows atrial fibrillation with rapid ventricular response rate 100 bpm, widened QRS, normal axis, normal intervals, no concerning ST changes.  RADIOLOGY  I viewed and interpreted the x-ray images.  Patient appears to have a left pleural effusion, no other acute process seen on my evaluation. Radiology has read small left pleural effusion with left basilar atelectasis versus consolidation.  Possible mild interstitial edema.   MEDICATIONS ORDERED IN ED: Medications  metoprolol tartrate (LOPRESSOR) injection 2.5 mg (has no administration in time range)  sotalol (BETAPACE) tablet 160 mg (has no administration in time range)     IMPRESSION / MDM / ASSESSMENT AND PLAN / ED COURSE  I reviewed the triage vital signs and the nursing notes.  Patient's presentation is most consistent with acute presentation with potential threat to life or bodily function.  Patient presents emergency department for atrial fibrillation with rapid ventricular response with some mild shortness of breath and palpitation symptoms.  Patient also states when her heart rate goes up it makes her very anxious feeling.  We will check labs including an INR as the patient  is on Coumadin.  We will dose the patient's typical morning sotalol dose which she has not taken yet we will also dose 2.5 mg of IV metoprolol and continue to closely monitor.  We will check labs including cardiac enzymes, chest x-ray.  Patient agreeable to plan of care.  Differential would include atrial fibrillation with rapid ventricular response, arrhythmia, ACS, electrolyte or metabolic abnormality, anemia.  Chest x-ray concerning for mild interstitial edema as well as a small pleural effusion.  CBC is normal, chemistry is normal, reassuringly troponin is normal.  I have added on a BNP.  Patient mains in atrial fibrillation with rapid ventricular response now back up to around 120 bpm.  Patient has a  Cardizem allergy we will start on the labetalol infusion.  Attempted titrate heart rate less than 100.  We will dose IV Lasix and admit to the hospital service.  Patient agreeable to plan of care.  CRITICAL CARE Performed by: Harvest Dark   Total critical care time: 30 minutes  Critical care time was exclusive of separately billable procedures and treating other patients.  Critical care was necessary to treat or prevent imminent or life-threatening deterioration.  Critical care was time spent personally by me on the following activities: development of treatment plan with patient and/or surrogate as well as nursing, discussions with consultants, evaluation of patient's response to treatment, examination of patient, obtaining history from patient or surrogate, ordering and performing treatments and interventions, ordering and review of laboratory studies, ordering and review of radiographic studies, pulse oximetry and re-evaluation of patient's condition.   FINAL CLINICAL IMPRESSION(S) / ED DIAGNOSES   Atrial fibrillation with rapid ventricular response Interstitial edema  Note:  This document was prepared using Dragon voice recognition software and may include unintentional dictation errors.   Harvest Dark, MD 04/02/22 1320

## 2022-04-03 ENCOUNTER — Inpatient Hospital Stay (HOSPITAL_BASED_OUTPATIENT_CLINIC_OR_DEPARTMENT_OTHER)
Admit: 2022-04-03 | Discharge: 2022-04-03 | Disposition: A | Discharge status: Home / Self Care | Payer: Medicare HMO | Attending: Internal Medicine | Admitting: Internal Medicine

## 2022-04-03 DIAGNOSIS — R000 Tachycardia, unspecified: Secondary | ICD-10-CM | POA: Diagnosis present

## 2022-04-03 DIAGNOSIS — R002 Palpitations: Secondary | ICD-10-CM | POA: Diagnosis not present

## 2022-04-03 DIAGNOSIS — I4891 Unspecified atrial fibrillation: Secondary | ICD-10-CM | POA: Diagnosis present

## 2022-04-03 DIAGNOSIS — R0609 Other forms of dyspnea: Secondary | ICD-10-CM | POA: Diagnosis not present

## 2022-04-03 LAB — URINALYSIS, COMPLETE (UACMP) WITH MICROSCOPIC
Bacteria, UA: NONE SEEN
Bilirubin Urine: NEGATIVE
Glucose, UA: NEGATIVE mg/dL
Hgb urine dipstick: NEGATIVE
Ketones, ur: NEGATIVE mg/dL
Nitrite: NEGATIVE
Protein, ur: NEGATIVE mg/dL
Specific Gravity, Urine: 1.005 (ref 1.005–1.030)
Squamous Epithelial / HPF: NONE SEEN (ref 0–5)
pH: 7 (ref 5.0–8.0)

## 2022-04-03 LAB — BASIC METABOLIC PANEL
Anion gap: 4 — ABNORMAL LOW (ref 5–15)
Anion gap: 7 (ref 5–15)
BUN: 11 mg/dL (ref 8–23)
BUN: 12 mg/dL (ref 8–23)
CO2: 25 mmol/L (ref 22–32)
CO2: 25 mmol/L (ref 22–32)
Calcium: 8.3 mg/dL — ABNORMAL LOW (ref 8.9–10.3)
Calcium: 8.5 mg/dL — ABNORMAL LOW (ref 8.9–10.3)
Chloride: 107 mmol/L (ref 98–111)
Chloride: 111 mmol/L (ref 98–111)
Creatinine, Ser: 0.77 mg/dL (ref 0.44–1.00)
Creatinine, Ser: 0.87 mg/dL (ref 0.44–1.00)
GFR, Estimated: >60 mL/min (ref >60)
GFR, Estimated: >60 mL/min (ref >60)
Glucose, Bld: 108 mg/dL — ABNORMAL HIGH (ref 70–99)
Glucose, Bld: 141 mg/dL — ABNORMAL HIGH (ref 70–99)
Potassium: 2.9 mmol/L — ABNORMAL LOW (ref 3.5–5.1)
Potassium: 3.5 mmol/L (ref 3.5–5.1)
Sodium: 139 mmol/L (ref 135–145)
Sodium: 140 mmol/L (ref 135–145)

## 2022-04-03 LAB — PROTIME-INR
INR: 3.1 — ABNORMAL HIGH (ref 0.8–1.2)
Prothrombin Time: 31.4 seconds — ABNORMAL HIGH (ref 11.4–15.2)

## 2022-04-03 LAB — MAGNESIUM
Magnesium: 2 mg/dL (ref 1.7–2.4)
Magnesium: 2.1 mg/dL (ref 1.7–2.4)

## 2022-04-03 LAB — ECHOCARDIOGRAM COMPLETE
AR max vel: 1.81 cm2
AV Area VTI: 1.85 cm2
AV Area mean vel: 1.9 cm2
AV Mean grad: 2 mmHg
AV Peak grad: 3.4 mmHg
Ao pk vel: 0.93 m/s
Area-P 1/2: 3.91 cm2
Height: 65 in
S' Lateral: 3.6 cm
Weight: 2398.6 [oz_av]

## 2022-04-03 LAB — CBC
HCT: 38.5 % (ref 36.0–46.0)
Hemoglobin: 12.7 g/dL (ref 12.0–15.0)
MCH: 30.5 pg (ref 26.0–34.0)
MCHC: 33 g/dL (ref 30.0–36.0)
MCV: 92.3 fL (ref 80.0–100.0)
Platelets: 213 10*3/uL (ref 150–400)
RBC: 4.17 MIL/uL (ref 3.87–5.11)
RDW: 12.6 % (ref 11.5–15.5)
WBC: 6.9 10*3/uL (ref 4.0–10.5)
nRBC: 0 % (ref 0.0–0.2)

## 2022-04-03 LAB — URINE CULTURE

## 2022-04-03 LAB — PROCALCITONIN: Procalcitonin: <0.1 ng/mL

## 2022-04-03 LAB — SARS CORONAVIRUS 2 BY RT PCR: SARS Coronavirus 2 by RT PCR: NEGATIVE

## 2022-04-03 MED ORDER — POTASSIUM CHLORIDE CRYS ER 20 MEQ PO TBCR
40.0000 meq | EXTENDED_RELEASE_TABLET | Freq: Once | ORAL | Status: AC
  Administered 2022-04-03: 40 meq via ORAL
  Filled 2022-04-03: qty 2

## 2022-04-03 MED ORDER — WARFARIN SODIUM 2.5 MG PO TABS
ORAL_TABLET | ORAL | Status: DC
Start: 2022-04-03 — End: 2022-04-30

## 2022-04-03 NOTE — Care Management CC44 (Signed)
Condition Code 44 Documentation Completed  Patient Details  Name: Daisy Parks MRN: [id number] Date of Birth: 1932-03-06   Condition Code 44 given:  Yes Patient signature on Condition Code 44 notice:  Yes Documentation of 2 MD's agreement:  Yes Code 44 added to claim:  Yes    Alberteen Sam, LCSW 04/03/2022, 4:28 PM

## 2022-04-03 NOTE — Care Management Obs Status (Signed)
[place] NOTIFICATION   Patient Details  Name: Daisy Parks MRN: [id number] Date of Birth: [birth year]   Medicare Observation Status Notification Given:  Yes    Alberteen Sam, LCSW 04/03/2022, 4:28 PM

## 2022-04-03 NOTE — Progress Notes (Signed)
*  PRELIMINARY RESULTS* Echocardiogram 2D Echocardiogram has been performed.  Daisy Parks 04/03/2022, 8:15 AM

## 2022-04-03 NOTE — Consult Note (Signed)
ANTICOAGULATION CONSULT NOTE - Initial Consult  Pharmacy Consult for warfarin Indication: atrial fibrillation  Allergies  Allergen Reactions   Diltiazem     Other reaction(s): Dizziness dizzy dizzy    Hydrochlorothiazide     Other reaction(s): Dizziness   Chlorthalidone Other (See Comments)    Other reaction(s): Other (See Comments) Exacerbates afib Exacerbates afib    Pravastatin     Other reaction(s): Other (See Comments), Other (See Comments) Other reaction(s): MUSCLE PAIN Muscle pain  Other reaction(s): MUSCLE PAIN Muscle pain     Venlafaxine     Other reaction(s): Other (See Comments) Light headed Light headed    Doxazosin Palpitations    and Hypotension and Hypotension    Enalapril Itching    Other reaction(s): Other (See Comments), Other (See Comments) Cough  Cough     Losartan Palpitations   Mometasone Rash    Patient Measurements: Height: '5\' 5"'$  (165.1 cm) Weight: 68 kg (149 lb 14.6 oz) IBW/kg (Calculated) : 57  Vital Signs: BP: 115/48 (07/13 0730) Pulse Rate: 80 (07/13 0730)  Labs: Recent Labs    04/02/22 1005 04/02/22 1402 04/02/22 1443 04/03/22 0028 04/03/22 0448  HGB 14.7  --   --   --  12.7  HCT 45.8  --   --   --  38.5  PLT 298  --   --   --  213  LABPROT 33.3*  --  31.4*  --  31.4*  INR 3.3*  --  3.1*  --  3.1*  CREATININE 0.88  --   --  0.87 0.77  TROPONINIHS 7 7  --   --   --      Estimated Creatinine Clearance: 44.6 mL/min (by C-G formula based on SCr of 0.77 mg/dL).   Medical History: Past Medical History:  Diagnosis Date   Atrial fibrillation ([place])    Breast cancer ([place]) 1192   LUMPECTOMY   Cancer ([place])    Coronary artery disease    Hyperlipidemia    Hypertension    Syncope and collapse     Medications:  Home regimen: Warfarin 2.'5mg'$  Thursday, '5mg'$  rest of week (32.'5mg'$ /week) Last dose of warfarin was yesterday, 7/11 at 1200 per patient  Assessment: Daisy Parks is a 88 y.o. female with a past medical history  of paroxysmal atrial fibrillation on Coumadin sotalol metoprolol, hypertension, hyperlipidemia, anxiety. Pharmacy consulted for inpatient monitoring and continuation of warfarin inpatient for atrial fibrillation  Goal of Therapy: INR 2-3 Monitor platelets by anticoagulation protocol: Yes   Date INR Dose  7/12 3.1 HOLD  7/13 3.1 HOLD    Plan:  INR supratherapeutic. Hold warfarin today Continue to monitor INR daily with AM labs   Darrick Penna 04/03/2022,8:53 AM

## 2022-04-03 NOTE — Progress Notes (Signed)
       CROSS COVER NOTE  NAME: Daisy Parks MRN: [id number] DOB : September 04, 1932    Date of Service   04/03/22  HPI/Events of Note   Secure chat received from nursing reporting soft BP, HR<60, and increasing QTc.  HR 59 BP 108/44  Converted to NSR around 0345  Interventions   Plan: Give 10A metoprolol now Stop IV Labetalol     This document was prepared using Dragon voice recognition software and may include unintentional dictation errors.  Neomia Glass DNP, MHA, FNP-BC Nurse Practitioner [facility] Pager [phone]

## 2022-04-03 NOTE — ED Notes (Signed)
Echo at bedside, pt breakfast given to son for afterwards

## 2022-04-03 NOTE — Discharge Summary (Signed)
Physician Discharge Summary   Patient: Daisy Parks MRN: [id number]  DOB: March 09, 1932   Admit:     Date of Admission: 04/02/2022 Admitted from: home   Discharge: Date of discharge: 04/03/22 Disposition: Home (lives w/ Son) Condition at discharge: good  CODE STATUS: FULL     Discharge Physician: Emeterio Reeve, DO [facility]     PCP: Caryl Never, MD  Recommendations for Outpatient Follow-up:  Follow up with PCP Caryl Never, MD in early next week weeks Please obtain labs/tests: CBC, BMP, INR, EKG, consider CXR if s/s fluid overload, monitor BP Please follow up on the following pending results: none   Discharge Instructions     Diet - low sodium heart healthy   Complete by: As directed    Discharge instructions   Complete by: As directed    Follow up with your primary doctor early next week - call their office and let them know you were in the hospital and need your heart rate and blood pressure checked up on, and urine rechecked, possible repeat chest xray of the lungs   Increase activity slowly   Complete by: As directed           Brief/Interim Summary: [name] is an 88 year old female with history of paroxysmal atrial fibrillation on warfarin, sotalol, metoprolol, hypertension, anxiety, hyperlipidemia, who presents emergency department 04/02/22 for chief concerns of palpitation and anxiety.  She reports that she has taken her home dose of metoprolol 25 mg p.o. dose at home prior to presentation and did not take her home dose of sotalol 160 mg p.o. prior to ED presentation. 07/12: HR 120s in ED. H&P notes UA(+0 and Cx sent but I do not see these results in Epic. CXR and BNP concerning for mild fluid overload, given Lasix IV x2. Was on labetalol gtt and developed long Qt and bradycardia so labetalol was stopped. Converted to sinus. 07/13: repeat UA no significant WBC on micro. Palpitations, SOB, and dysuria have resolved. No s/s CHF  and no concerns on echo. Pt reported low BP often at home so we d/c home amlodipine but other meds same as HR has been stable. No concerns on telemetry.   Consultants:  none  Procedures:  none      Discharge Diagnoses: Principal Problem:   Palpitations Active Problems:   Essential hypertension   PAF (paroxysmal atrial fibrillation) (HCC)   Anxiety   Pyuria   Elevated brain natriuretic peptide (BNP) level   Tachycardia   Atrial fibrillation (HCC)    Assessment & Plan:  Essential hypertension Patient takes amlodipine 5 mg daily but reports low BP at home, this was d/c Pt is on sotalol AND metoprolol succinate - confirmed by recent outpatient note, these were continued on discharge  PAF (paroxysmal atrial fibrillation) (HCC) Warfarin per pharmacy ordered Resumed home metoprolol succinate 25 mg p.o. twice daily, sotalol 160 mg p.o. twice daily Off labetalol gtt and back on po meds and HR stable in 60's Follow closely outpatient   Anxiety Resumed home mirtazapine 15 mg nightly Patient states she is no longer taking buspirone 7.5 mg 3 times daily due to ineffectiveness this was removed from med list  Recommend continued outpatient follow-up with PCP and or behavioral health specialist  Palpitations Resolved Afib tachycardic but not RVR Echo as below   Dysuria Pyuria noted on H&P but UCx not sent/processed repeated UA micro and no significant WBC Pt reports ongoing intermittent dysuria, suspect atrophic vaginitis as  likely cause, pt declined genital exam today   Elevated brain natriuretic peptide (BNP) level BNP resulted as 875 on admission with new endorsement of worsening shortness of breath with exertion She denies swelling in her lower extremities Complete echo ordered, no CHF but unable to evaluate diastolic fxn No rales or LE edema and SOB has resolved status post furosemide 40 mg IV x2 Monitor outpatient      Discharge Instructions  Allergies as of  04/03/2022       Reactions   Diltiazem    Other reaction(s): Dizziness dizzy dizzy   Hydrochlorothiazide    Other reaction(s): Dizziness   Chlorthalidone Other (See Comments)   Other reaction(s): Other (See Comments) Exacerbates afib Exacerbates afib   Pravastatin    Other reaction(s): Other (See Comments), Other (See Comments) Other reaction(s): MUSCLE PAIN Muscle pain  Other reaction(s): MUSCLE PAIN Muscle pain    Venlafaxine    Other reaction(s): Other (See Comments) Light headed Light headed   Doxazosin Palpitations   and Hypotension and Hypotension   Enalapril Itching   Other reaction(s): Other (See Comments), Other (See Comments) Cough  Cough    Losartan Palpitations   Mometasone Rash        Medication List     STOP taking these medications    amLODipine 5 MG tablet Commonly known as: NORVASC   busPIRone 7.5 MG tablet Commonly known as: BUSPAR       TAKE these medications    metoprolol succinate 25 MG 24 hr tablet Commonly known as: TOPROL-XL Take 25 mg by mouth 2 (two) times daily.   mirtazapine 15 MG tablet Commonly known as: REMERON Take 15 mg by mouth at bedtime.   sotalol 160 MG tablet Commonly known as: BETAPACE Take 160 mg by mouth 2 (two) times daily.   warfarin 2.5 MG tablet Commonly known as: COUMADIN 2.5 mg on Thursday and 5 mg all other days What changed:  how much to take how to take this when to take this additional instructions          Allergies  Allergen Reactions   Diltiazem     Other reaction(s): Dizziness dizzy dizzy    Hydrochlorothiazide     Other reaction(s): Dizziness   Chlorthalidone Other (See Comments)    Other reaction(s): Other (See Comments) Exacerbates afib Exacerbates afib    Pravastatin     Other reaction(s): Other (See Comments), Other (See Comments) Other reaction(s): MUSCLE PAIN Muscle pain  Other reaction(s): MUSCLE PAIN Muscle pain     Venlafaxine     Other reaction(s):  Other (See Comments) Light headed Light headed    Doxazosin Palpitations    and Hypotension and Hypotension    Enalapril Itching    Other reaction(s): Other (See Comments), Other (See Comments) Cough  Cough     Losartan Palpitations   Mometasone Rash     Subjective: Pt denies CP/palpitations, denies SOB.    Discharge Exam: Vitals:   04/03/22 1500 04/03/22 1530  BP: 128/66 (!) 142/66  Pulse: (!) 59 68  Resp:    Temp:    SpO2: 99% 97%  General: Pt is alert, awake, not in acute distress Cardiovascular: RRR, S1/S2 +, no rubs, no gallops Respiratory: CTA bilaterally, no wheezing, no rhonchi Abdominal: Soft, NT, ND, bowel sounds + Extremities: no edema, no cyanosis     The results of significant diagnostics from this hospitalization (including imaging, microbiology, ancillary and laboratory) are listed below for reference.     Microbiology: Recent Results (  from the past 240 hour(s))  SARS Coronavirus 2 by RT PCR (hospital order, performed in [facility] hospital lab) *cepheid single result test* Anterior Nasal Swab     Status: None   Collection Time: 04/02/22 11:29 PM   Specimen: Anterior Nasal Swab  Result Value Ref Range Status   SARS Coronavirus 2 by RT PCR NEGATIVE NEGATIVE Final    Comment: (NOTE) SARS-CoV-2 target nucleic acids are NOT DETECTED.  The SARS-CoV-2 RNA is generally detectable in upper and lower respiratory specimens during the acute phase of infection. The lowest concentration of SARS-CoV-2 viral copies this assay can detect is 250 copies / mL. A negative result does not preclude SARS-CoV-2 infection and should not be used as the sole basis for treatment or other patient management decisions.  A negative result may occur with improper specimen collection / handling, submission of specimen other than nasopharyngeal swab, presence of viral mutation(s) within the areas targeted by this assay, and inadequate number of viral copies (<250 copies /  mL). A negative result must be combined with clinical observations, patient history, and epidemiological information.  Fact Sheet for Patients:   https://www.patel.info/  Fact Sheet for Healthcare Providers: https://hall.com/  This test is not yet approved or  cleared by the [place] FDA and has been authorized for detection and/or diagnosis of SARS-CoV-2 by FDA under an Emergency Use Authorization (EUA).  This EUA will remain in effect (meaning this test can be used) for the duration of the COVID-19 declaration under Section 564(b)(1) of the Act, 21 U.S.C. section 360bbb-3(b)(1), unless the authorization is terminated or revoked sooner.  Performed at [facility], [address] [place], [place] [address]      Labs: BNP (last 3 results) Recent Labs    04/02/22 1005  BNP 191.4*   Basic Metabolic Panel: Recent Labs  Lab 04/02/22 1005 04/03/22 0028 04/03/22 0448  NA 140 139 140  K 3.6 2.9* 3.5  CL 109 107 111  CO2 '28 25 25  '$ GLUCOSE 125* 141* 108*  BUN '11 12 11  '$ CREATININE 0.88 0.87 0.77  CALCIUM 8.9 8.3* 8.5*  MG  --  2.0 2.1   Liver Function Tests: Recent Labs  Lab 04/02/22 1005  AST 23  ALT 21  ALKPHOS 70  BILITOT 0.9  PROT 7.2  ALBUMIN 4.0   No results for input(s): "LIPASE", "AMYLASE" in the last 168 hours. No results for input(s): "AMMONIA" in the last 168 hours. CBC: Recent Labs  Lab 04/02/22 1005 04/03/22 0448  WBC 9.6 6.9  HGB 14.7 12.7  HCT 45.8 38.5  MCV 93.5 92.3  PLT 298 213   Cardiac Enzymes: No results for input(s): "CKTOTAL", "CKMB", "CKMBINDEX", "TROPONINI" in the last 168 hours. BNP: Invalid input(s): "POCBNP" CBG: No results for input(s): "GLUCAP" in the last 168 hours. D-Dimer No results for input(s): "DDIMER" in the last 72 hours. Hgb A1c No results for input(s): "HGBA1C" in the last 72 hours. Lipid Profile No results for input(s): "CHOL", "HDL", "LDLCALC",  "TRIG", "CHOLHDL", "LDLDIRECT" in the last 72 hours. Thyroid function studies No results for input(s): "TSH", "T4TOTAL", "T3FREE", "THYROIDAB" in the last 72 hours.  Invalid input(s): "FREET3" Anemia work up No results for input(s): "VITAMINB12", "FOLATE", "FERRITIN", "TIBC", "IRON", "RETICCTPCT" in the last 72 hours. Urinalysis    Component Value Date/Time   COLORURINE STRAW (A) 04/03/2022 1432   APPEARANCEUR CLEAR (A) 04/03/2022 1432   LABSPEC 1.005 04/03/2022 1432   PHURINE 7.0 04/03/2022 1432   GLUCOSEU NEGATIVE 04/03/2022  [place] 04/03/2022 1432   BILIRUBINUR NEGATIVE 04/03/2022 1432   KETONESUR NEGATIVE 04/03/2022 1432   PROTEINUR NEGATIVE 04/03/2022 1432   NITRITE NEGATIVE 04/03/2022 1432   LEUKOCYTESUR SMALL (A) 04/03/2022 1432   Sepsis Labs Recent Labs  Lab 04/02/22 1005 04/03/22 0448  WBC 9.6 6.9   Microbiology Recent Results (from the past 240 hour(s))  SARS Coronavirus 2 by RT PCR (hospital order, performed in [facility] hospital lab) *cepheid single result test* Anterior Nasal Swab     Status: None   Collection Time: 04/02/22 11:29 PM   Specimen: Anterior Nasal Swab  Result Value Ref Range Status   SARS Coronavirus 2 by RT PCR NEGATIVE NEGATIVE Final    Comment: (NOTE) SARS-CoV-2 target nucleic acids are NOT DETECTED.  The SARS-CoV-2 RNA is generally detectable in upper and lower respiratory specimens during the acute phase of infection. The lowest concentration of SARS-CoV-2 viral copies this assay can detect is 250 copies / mL. A negative result does not preclude SARS-CoV-2 infection and should not be used as the sole basis for treatment or other patient management decisions.  A negative result may occur with improper specimen collection / handling, submission of specimen other than nasopharyngeal swab, presence of viral mutation(s) within the areas targeted by this assay, and inadequate number of viral copies (<250 copies / mL). A  negative result must be combined with clinical observations, patient history, and epidemiological information.  Fact Sheet for Patients:   https://www.patel.info/  Fact Sheet for Healthcare Providers: https://hall.com/  This test is not yet approved or  cleared by the [place] FDA and has been authorized for detection and/or diagnosis of SARS-CoV-2 by FDA under an Emergency Use Authorization (EUA).  This EUA will remain in effect (meaning this test can be used) for the duration of the COVID-19 declaration under Section 564(b)(1) of the Act, 21 U.S.C. section 360bbb-3(b)(1), unless the authorization is terminated or revoked sooner.  Performed at [facility], [address] [place], [place] [address]    Imaging ECHOCARDIOGRAM COMPLETE  Result Date: 04/03/2022    ECHOCARDIOGRAM REPORT   Patient Name:   CANDACE BEGUE Date of Exam: 04/03/2022 Medical Rec #:  [id number]    Height:       65.0 in Accession #:    [id number]   Weight:       149.9 lb Date of Birth:  [birth year]    BSA:          1.750 m Patient Age:    88 years     BP:           115/48 mmHg Patient Gender: F            HR:           80 bpm. Exam Location:  [facility] Procedure: 2D Echo, Cardiac Doppler and Color Doppler Indications:     Dyspnea R06.00  History:         Patient has no prior history of Echocardiogram examinations.                  CAD, Arrythmias:Atrial Fibrillation; Risk Factors:Hypertension.  Sonographer:     Sherrie Sport Referring Phys:  [id number] AMY N COX Diagnosing Phys: Ida Rogue MD IMPRESSIONS  1. Left ventricular ejection fraction, by estimation, is 50 to 55%. The left ventricle has low normal function. The left ventricle has no regional wall motion abnormalities. There is mild left ventricular hypertrophy. Left ventricular diastolic parameters are indeterminate.  2. Right ventricular systolic function is normal. The right ventricular size is normal. There is  mildly elevated pulmonary artery systolic pressure. The estimated right ventricular systolic pressure is 42.7 mmHg.  3. Left atrial size was mildly dilated.  4. The mitral valve is normal in structure. Mild to moderate mitral valve regurgitation. No evidence of mitral stenosis.  5. The aortic valve has an indeterminant number of cusps. Aortic valve regurgitation is not visualized. No aortic stenosis is present.  6. The inferior vena cava is normal in size with <50% respiratory variability, suggesting right atrial pressure of 8 mmHg. FINDINGS  Left Ventricle: Left ventricular ejection fraction, by estimation, is 50 to 55%. The left ventricle has low normal function. The left ventricle has no regional wall motion abnormalities. The left ventricular internal cavity size was normal in size. There is mild left ventricular hypertrophy. Left ventricular diastolic parameters are indeterminate. Right Ventricle: The right ventricular size is normal. No increase in right ventricular wall thickness. Right ventricular systolic function is normal. There is mildly elevated pulmonary artery systolic pressure. The tricuspid regurgitant velocity is 2.89  m/s, and with an assumed right atrial pressure of 5 mmHg, the estimated right ventricular systolic pressure is 06.2 mmHg. Left Atrium: Left atrial size was mildly dilated. Right Atrium: Right atrial size was normal in size. Pericardium: There is no evidence of pericardial effusion. Mitral Valve: The mitral valve is normal in structure. Mild mitral annular calcification. Mild to moderate mitral valve regurgitation. No evidence of mitral valve stenosis. Tricuspid Valve: The tricuspid valve is normal in structure. Tricuspid valve regurgitation is not demonstrated. No evidence of tricuspid stenosis. Aortic Valve: The aortic valve has an indeterminant number of cusps. Aortic valve regurgitation is not visualized. No aortic stenosis is present. Aortic valve mean gradient measures 2.0 mmHg.  Aortic valve peak gradient measures 3.4 mmHg. Aortic valve area, by VTI measures 1.85 cm. Pulmonic Valve: The pulmonic valve was normal in structure. Pulmonic valve regurgitation is not visualized. No evidence of pulmonic stenosis. Aorta: The aortic root is normal in size and structure. Venous: The inferior vena cava is normal in size with less than 50% respiratory variability, suggesting right atrial pressure of 8 mmHg. IAS/Shunts: No atrial level shunt detected by color flow Doppler. Additional Comments: A device lead is visualized.  LEFT VENTRICLE PLAX 2D LVIDd:         5.10 cm   Diastology LVIDs:         3.60 cm   LV e' medial:    6.85 cm/s LV PW:         1.30 cm   LV E/e' medial:  16.6 LV IVS:        1.30 cm   LV e' lateral:   6.96 cm/s LVOT diam:     2.00 cm   LV E/e' lateral: 16.4 LV SV:         44 LV SV Index:   25 LVOT Area:     3.14 cm  RIGHT VENTRICLE RV Basal diam:  3.70 cm RV S prime:     7.94 cm/s TAPSE (M-mode): 2.4 cm LEFT ATRIUM             Index        RIGHT ATRIUM           Index LA diam:        4.30 cm 2.46 cm/m   RA Area:     16.60 cm LA Vol (A2C):   95.3 ml 54.46  ml/m  RA Volume:   45.20 ml  25.83 ml/m LA Vol (A4C):   68.1 ml 38.91 ml/m LA Biplane Vol: 81.1 ml 46.34 ml/m  AORTIC VALVE AV Area (Vmax):    1.81 cm AV Area (Vmean):   1.90 cm AV Area (VTI):     1.85 cm AV Vmax:           92.80 cm/s AV Vmean:          64.300 cm/s AV VTI:            0.238 m AV Peak Grad:      3.4 mmHg AV Mean Grad:      2.0 mmHg LVOT Vmax:         53.50 cm/s LVOT Vmean:        38.800 cm/s LVOT VTI:          0.140 m LVOT/AV VTI ratio: 0.59  AORTA Ao Root diam: 2.90 cm MITRAL VALVE                TRICUSPID VALVE MV Area (PHT): 3.91 cm     TR Peak grad:   33.4 mmHg MV Decel Time: 194 msec     TR Vmax:        289.00 cm/s MV E velocity: 114.00 cm/s MV A velocity: 58.30 cm/s   SHUNTS MV E/A ratio:  1.96         Systemic VTI:  0.14 m                             Systemic Diam: 2.00 cm Ida Rogue MD  Electronically signed by Ida Rogue MD Signature Date/Time: 04/03/2022/3:02:43 PM    Final       Time coordinating discharge: Over 30 minutes  SIGNED:  Emeterio Reeve DO [facility]

## 2022-04-27 ENCOUNTER — Emergency Department: Payer: Medicare HMO

## 2022-04-27 ENCOUNTER — Other Ambulatory Visit: Payer: Self-pay

## 2022-04-27 ENCOUNTER — Inpatient Hospital Stay
Admission: EM | Admit: 2022-04-27 | Discharge: 2022-04-30 | DRG: 308 | Disposition: A | Discharge status: Home Health | Payer: Medicare HMO | Attending: Internal Medicine | Admitting: Internal Medicine

## 2022-04-27 ENCOUNTER — Encounter: Payer: Self-pay | Admitting: Emergency Medicine

## 2022-04-27 DIAGNOSIS — G4700 Insomnia, unspecified: Secondary | ICD-10-CM | POA: Diagnosis present

## 2022-04-27 DIAGNOSIS — Z853 Personal history of malignant neoplasm of breast: Secondary | ICD-10-CM

## 2022-04-27 DIAGNOSIS — R600 Localized edema: Secondary | ICD-10-CM | POA: Diagnosis not present

## 2022-04-27 DIAGNOSIS — I482 Chronic atrial fibrillation, unspecified: Secondary | ICD-10-CM | POA: Diagnosis present

## 2022-04-27 DIAGNOSIS — I4819 Other persistent atrial fibrillation: Principal | ICD-10-CM | POA: Diagnosis present

## 2022-04-27 DIAGNOSIS — Z823 Family history of stroke: Secondary | ICD-10-CM

## 2022-04-27 DIAGNOSIS — I5033 Acute on chronic diastolic (congestive) heart failure: Secondary | ICD-10-CM | POA: Diagnosis present

## 2022-04-27 DIAGNOSIS — Z79899 Other long term (current) drug therapy: Secondary | ICD-10-CM

## 2022-04-27 DIAGNOSIS — I2510 Atherosclerotic heart disease of native coronary artery without angina pectoris: Secondary | ICD-10-CM | POA: Diagnosis present

## 2022-04-27 DIAGNOSIS — I495 Sick sinus syndrome: Secondary | ICD-10-CM | POA: Diagnosis present

## 2022-04-27 DIAGNOSIS — E876 Hypokalemia: Secondary | ICD-10-CM | POA: Diagnosis present

## 2022-04-27 DIAGNOSIS — T502X5A Adverse effect of carbonic-anhydrase inhibitors, benzothiadiazides and other diuretics, initial encounter: Secondary | ICD-10-CM | POA: Diagnosis present

## 2022-04-27 DIAGNOSIS — F411 Generalized anxiety disorder: Secondary | ICD-10-CM | POA: Diagnosis present

## 2022-04-27 DIAGNOSIS — I5023 Acute on chronic systolic (congestive) heart failure: Secondary | ICD-10-CM

## 2022-04-27 DIAGNOSIS — Z515 Encounter for palliative care: Secondary | ICD-10-CM

## 2022-04-27 DIAGNOSIS — Y92239 Unspecified place in hospital as the place of occurrence of the external cause: Secondary | ICD-10-CM | POA: Diagnosis present

## 2022-04-27 DIAGNOSIS — I480 Paroxysmal atrial fibrillation: Secondary | ICD-10-CM | POA: Diagnosis not present

## 2022-04-27 DIAGNOSIS — I110 Hypertensive heart disease with heart failure: Secondary | ICD-10-CM | POA: Diagnosis present

## 2022-04-27 DIAGNOSIS — I10 Essential (primary) hypertension: Secondary | ICD-10-CM | POA: Diagnosis present

## 2022-04-27 DIAGNOSIS — Z8249 Family history of ischemic heart disease and other diseases of the circulatory system: Secondary | ICD-10-CM

## 2022-04-27 DIAGNOSIS — Z950 Presence of cardiac pacemaker: Secondary | ICD-10-CM

## 2022-04-27 DIAGNOSIS — Z66 Do not resuscitate: Secondary | ICD-10-CM | POA: Diagnosis not present

## 2022-04-27 DIAGNOSIS — E785 Hyperlipidemia, unspecified: Secondary | ICD-10-CM | POA: Diagnosis present

## 2022-04-27 DIAGNOSIS — I4891 Unspecified atrial fibrillation: Secondary | ICD-10-CM | POA: Diagnosis present

## 2022-04-27 DIAGNOSIS — Z7901 Long term (current) use of anticoagulants: Secondary | ICD-10-CM

## 2022-04-27 LAB — BASIC METABOLIC PANEL
Anion gap: 7 (ref 5–15)
BUN: 12 mg/dL (ref 8–23)
CO2: 24 mmol/L (ref 22–32)
Calcium: 9.1 mg/dL (ref 8.9–10.3)
Chloride: 106 mmol/L (ref 98–111)
Creatinine, Ser: 0.8 mg/dL (ref 0.44–1.00)
GFR, Estimated: >60 mL/min (ref >60)
Glucose, Bld: 115 mg/dL — ABNORMAL HIGH (ref 70–99)
Potassium: 3.3 mmol/L — ABNORMAL LOW (ref 3.5–5.1)
Sodium: 137 mmol/L (ref 135–145)

## 2022-04-27 LAB — CBC
HCT: 42.2 % (ref 36.0–46.0)
Hemoglobin: 13.8 g/dL (ref 12.0–15.0)
MCH: 30.5 pg (ref 26.0–34.0)
MCHC: 32.7 g/dL (ref 30.0–36.0)
MCV: 93.2 fL (ref 80.0–100.0)
Platelets: 236 10*3/uL (ref 150–400)
RBC: 4.53 MIL/uL (ref 3.87–5.11)
RDW: 13 % (ref 11.5–15.5)
WBC: 7.6 10*3/uL (ref 4.0–10.5)
nRBC: 0 % (ref 0.0–0.2)

## 2022-04-27 LAB — TROPONIN I (HIGH SENSITIVITY)
Troponin I (High Sensitivity): 5 ng/L (ref <18)
Troponin I (High Sensitivity): 5 ng/L (ref <18)

## 2022-04-27 LAB — PROTIME-INR
INR: 1.7 — ABNORMAL HIGH (ref 0.8–1.2)
Prothrombin Time: 20.1 seconds — ABNORMAL HIGH (ref 11.4–15.2)

## 2022-04-27 LAB — BRAIN NATRIURETIC PEPTIDE: B Natriuretic Peptide: 574.4 pg/mL — ABNORMAL HIGH (ref 0.0–100.0)

## 2022-04-27 MED ORDER — POTASSIUM CHLORIDE CRYS ER 20 MEQ PO TBCR
40.0000 meq | EXTENDED_RELEASE_TABLET | Freq: Once | ORAL | Status: AC
  Administered 2022-04-27: 40 meq via ORAL
  Filled 2022-04-27: qty 2

## 2022-04-27 MED ORDER — METOPROLOL TARTRATE 5 MG/5ML IV SOLN
2.5000 mg | Freq: Once | INTRAVENOUS | Status: DC
  Filled 2022-04-27: qty 5

## 2022-04-27 MED ORDER — APIXABAN 5 MG PO TABS
5.0000 mg | ORAL_TABLET | Freq: Two times a day (BID) | ORAL | Status: DC
  Administered 2022-04-28 – 2022-04-30 (×6): 5 mg via ORAL
  Filled 2022-04-27 (×2): qty 1
  Filled 2022-04-27: qty 2
  Filled 2022-04-27 (×4): qty 1

## 2022-04-27 MED ORDER — ACETAMINOPHEN 325 MG PO TABS: 650.0000 mg | ORAL_TABLET | Freq: Four times a day (QID) | ORAL | Status: DC | PRN

## 2022-04-27 MED ORDER — FUROSEMIDE 10 MG/ML IJ SOLN
40.0000 mg | Freq: Once | INTRAMUSCULAR | Status: AC
  Administered 2022-04-27: 40 mg via INTRAVENOUS
  Filled 2022-04-27: qty 4

## 2022-04-27 MED ORDER — ONDANSETRON HCL 4 MG PO TABS: 4.0000 mg | ORAL_TABLET | Freq: Four times a day (QID) | ORAL | Status: DC | PRN

## 2022-04-27 MED ORDER — MIRTAZAPINE 15 MG PO TABS
15.0000 mg | ORAL_TABLET | Freq: Every day | ORAL | Status: DC
  Administered 2022-04-27: 15 mg via ORAL
  Filled 2022-04-27: qty 1

## 2022-04-27 MED ORDER — ONDANSETRON HCL 4 MG/2ML IJ SOLN: 4.0000 mg | Freq: Four times a day (QID) | INTRAMUSCULAR | Status: DC | PRN

## 2022-04-27 MED ORDER — METOPROLOL SUCCINATE ER 25 MG PO TB24
25.0000 mg | ORAL_TABLET | Freq: Two times a day (BID) | ORAL | Status: DC
  Administered 2022-04-27 – 2022-04-30 (×5): 25 mg via ORAL
  Filled 2022-04-27 (×5): qty 1

## 2022-04-27 MED ORDER — SOTALOL HCL 80 MG PO TABS: 160.0000 mg | ORAL_TABLET | Freq: Two times a day (BID) | ORAL | Status: DC

## 2022-04-27 MED ORDER — ACETAMINOPHEN 650 MG RE SUPP: 650.0000 mg | Freq: Four times a day (QID) | RECTAL | Status: DC | PRN

## 2022-04-27 NOTE — ED Notes (Signed)
Lab called for collection

## 2022-04-27 NOTE — ED Triage Notes (Addendum)
Pt via POV from home. Pt c/o bilateral feet swelling and SOB since yesterday. Denies any pain. Pt is on Eliquis. Denies hx of CHF. Pt is A&OX4 and NAD

## 2022-04-27 NOTE — ED Provider Notes (Signed)
[facility] Provider Note    Event Date/Time   First MD Initiated Contact with Patient 04/27/22 1841     (approximate)   History   Shortness of Breath   HPI  Daisy Parks is a 88 y.o. female here with shortness of breath.  The patient states that over the last week or so, she has had progressively worsening shortness of breath.  She has had increasing lower extremity edema.  She goes to [facility] cardiology and was recently switched from sotalol to amiodarone and was switched to Eliquis from warfarin.  She states that she was also started on furosemide for the first time.  She has been taking this, but has had persistent leg edema as well as shortness of breath.  She feels incredibly winded, cannot lay flat.  She feels worse at night and has been unable to sleep.  Denies any chest pain.  No sputum production.     Physical Exam   Triage Vital Signs: ED Triage Vitals  Enc Vitals Group     BP 04/27/22 1802 (!) 138/94     Pulse Rate 04/27/22 1802 (!) 112     Resp 04/27/22 1802 20     Temp 04/27/22 1802 (!) 97.5 F (36.4 C)     Temp Source 04/27/22 1802 Oral     SpO2 04/27/22 1802 96 %     Weight 04/27/22 1800 152 lb (68.9 kg)     Height 04/27/22 1800 '5\' 5"'$  (1.651 m)     Head Circumference --      Peak Flow --      Pain Score 04/27/22 1800 0     Pain Loc --      Pain Edu? --      Excl. in [place]? --     Most recent vital signs: Vitals:   04/27/22 2358 04/28/22 0030  BP: 122/87 131/83  Pulse: (!) 101 (!) 113  Resp:  16  Temp:    SpO2:  98%     General: Awake, no distress.  CV:  Good peripheral perfusion.  Tachycardic, slightly irregular.  No murmurs. Resp:  Normal effort.  Bilateral rales noted.  Slight tachypnea noted. Abd:  No distention.  No tenderness.  No guarding or rebound. Other:  2+ pitting edema bilateral lower extremities.   ED Results / Procedures / Treatments   Labs (all labs ordered are listed, but only abnormal results are  displayed) Labs Reviewed  BASIC METABOLIC PANEL - Abnormal; Notable for the following components:      Result Value   Potassium 3.3 (*)    Glucose, Bld 115 (*)    All other components within normal limits  BRAIN NATRIURETIC PEPTIDE - Abnormal; Notable for the following components:   B Natriuretic Peptide 574.4 (*)    All other components within normal limits  PROTIME-INR - Abnormal; Notable for the following components:   Prothrombin Time 20.1 (*)    INR 1.7 (*)    All other components within normal limits  CBC  TROPONIN I (HIGH SENSITIVITY)  TROPONIN I (HIGH SENSITIVITY)     EKG Atrial fibrillation with rapid ventricular response, ventricular rate 118.  QRS 86, QTc 473.  No acute ST elevations or depressions.   RADIOLOGY Chest x-ray: Moderate bilateral pleural effusions, atelectasis, diffuse bilateral edema   I also independently reviewed and agree with radiologist interpretations.   PROCEDURES:  Critical Care performed: Yes, see critical care procedure note(s)  .1-3 Lead EKG Interpretation  Performed  by: Duffy Bruce, MD Authorized by: Duffy Bruce, MD     Interpretation: normal     ECG rate:  80-120   ECG rate assessment: normal     Rhythm: sinus rhythm     Ectopy: none     Conduction: normal   Comments:     Indication: Tachycardia     MEDICATIONS ORDERED IN ED: Medications  metoprolol succinate (TOPROL-XL) 24 hr tablet 25 mg (25 mg Oral Given 04/27/22 2358)  mirtazapine (REMERON) tablet 15 mg (15 mg Oral Given 04/27/22 2359)  apixaban (ELIQUIS) tablet 5 mg (has no administration in time range)  acetaminophen (TYLENOL) tablet 650 mg (has no administration in time range)    Or  acetaminophen (TYLENOL) suppository 650 mg (has no administration in time range)  ondansetron (ZOFRAN) tablet 4 mg (has no administration in time range)    Or  ondansetron (ZOFRAN) injection 4 mg (has no administration in time range)  furosemide (LASIX) injection 40 mg (40 mg  Intravenous Given 04/27/22 1945)  potassium chloride SA (KLOR-CON M) CR tablet 40 mEq (40 mEq Oral Given 04/27/22 1944)     IMPRESSION / MDM / ASSESSMENT AND PLAN / ED COURSE  I reviewed the triage vital signs and the nursing notes.                               The patient is on the cardiac monitor to evaluate for evidence of arrhythmia and/or significant heart rate changes.   Ddx:  Differential includes the following, with pertinent life- or limb-threatening emergencies considered:  CHF exacerbation, PNA, PTX, ACS, anemia  Patient's presentation is most consistent with acute presentation with potential threat to life or bodily function.  MDM:  88 yo F with PMHx AFib, CHF, here with weakness, SOB. Pt hypervolemic on exam with b/l rales and pitting edema. CXR shows edema and effusions. Cr at baseline. No leukocytosis, no fevers or infectious sx. BNP remains elevated. EKG nonischemic. Will admit for IV diuresis.   MEDICATIONS GIVEN IN ED: Medications  metoprolol succinate (TOPROL-XL) 24 hr tablet 25 mg (25 mg Oral Given 04/27/22 2358)  mirtazapine (REMERON) tablet 15 mg (15 mg Oral Given 04/27/22 2359)  apixaban (ELIQUIS) tablet 5 mg (has no administration in time range)  acetaminophen (TYLENOL) tablet 650 mg (has no administration in time range)    Or  acetaminophen (TYLENOL) suppository 650 mg (has no administration in time range)  ondansetron (ZOFRAN) tablet 4 mg (has no administration in time range)    Or  ondansetron (ZOFRAN) injection 4 mg (has no administration in time range)  furosemide (LASIX) injection 40 mg (40 mg Intravenous Given 04/27/22 1945)  potassium chloride SA (KLOR-CON M) CR tablet 40 mEq (40 mEq Oral Given 04/27/22 1944)     Consults:  Dr. Posey Pronto, Hospitalist   EMR reviewed  Reviewed H&P from July, Cardiology visits 7/27 with [facility] - pt recently transitioned to amiodarone, lasix, from sotalol     FINAL CLINICAL IMPRESSION(S) / ED DIAGNOSES   Final  diagnoses:  Acute on chronic systolic congestive heart failure ([place])  Atrial fibrillation with rapid ventricular response [facility])     Rx / DC Orders   ED Discharge Orders          Ordered    Amb referral to AFIB Clinic        04/27/22 2108             Note:  This  document was prepared using Systems analyst and may include unintentional dictation errors.   Duffy Bruce, MD 04/28/22 [phone]

## 2022-04-27 NOTE — Assessment & Plan Note (Addendum)
Rate controlled with amiodarone and Toprol eliquis for anticoagulation Anxiety seem to be her biggest driver of most of her symptoms

## 2022-04-27 NOTE — Assessment & Plan Note (Addendum)
Recent d/c 2 weeks ago. Most recent echo shows elevated PA pressure and may be diastolic dysfunction. Increase Lasix to 40 mg IV daily for now

## 2022-04-27 NOTE — Assessment & Plan Note (Addendum)
We will restart Xanax.  She has taken Xanax, paroxetine, venlafaxine and Celexa in the past but currently on none.  Her anxiety and insomnia seem to be her biggest issue.  We will add trazodone for insomnia

## 2022-04-27 NOTE — H&P (Signed)
History and Physical    Daisy Parks JJO:[id number] DOB: March 23, 1932 DOA: 04/27/2022  PCP: Caryl Never, MD  Cardiology: Dr.Nahser- [facility].  Patient coming from:  Home    Chief Complaint:  LE edema.    HPI:  Daisy Parks is a 88 y.o. female seen in ed with complaints of Leg swelling for past few days. Says she does not eat salt and has been following healthy recipes. Pt does not report any headaches blurred vision chest pain shortness of breath palpitations fevers chills nausea vomiting abdominal pain or any falls.   Pt has past medical history of paroxysmal atrial fibrillation, hypertension, heart disease, syncope and collapse.  ED Course:   Vitals:   04/27/22 1802 04/27/22 1945 04/27/22 1946 04/27/22 2000  BP: (!) 138/94 115/79 115/79 (!) 112/97  Pulse: (!) 112 (!) 110 98 (!) 101  Resp: 20  17   Temp: (!) 97.5 F (36.4 C)     TempSrc: Oral     SpO2: 96% 98% 94% 98%  Weight:      Height:      In the emergency room patient meets SIRS criteria. An infection although is not suspected. Patient has been doing well up until a few days ago. BNP of 574.4, normal CBC, BMP showing potassium of 3.3. In the emergency room patient received 40 mg of Lasix IV. Output by Drain (mL) 04/25/22 0701 - 04/25/22 1900 04/25/22 1901 - 04/26/22 0700 04/26/22 0701 - 04/26/22 1900 04/26/22 1901 - 04/27/22 0700 04/27/22 0701 - 04/27/22 1900 04/27/22 1901 - 04/27/22 2116  Requested LDAs do not have output data documented.   Review of Systems:  Review of Systems  Cardiovascular:  Positive for leg swelling.  All other systems reviewed and are negative.  Past Medical History:  Diagnosis Date   Atrial fibrillation ([place])    Breast cancer ([place]) 1192   LUMPECTOMY   Cancer ([place])    Coronary artery disease    Hyperlipidemia    Hypertension    Syncope and collapse     Past Surgical History:  Procedure Laterality Date   BREAST LUMPECTOMY  1992   LEFT BREAST   WIDE LOCAL EXCISION OF VULVAR  NEOPLASIA WITH CLOSURE  1996   FASCUCUTANEOUS GRAFT     reports that she has never smoked. She has never used smokeless tobacco. She reports that she does not drink alcohol and does not use drugs.  Allergies  Allergen Reactions   Diltiazem     Other reaction(s): Dizziness dizzy dizzy    Hydrochlorothiazide     Other reaction(s): Dizziness   Chlorthalidone Other (See Comments)    Other reaction(s): Other (See Comments) Exacerbates afib Exacerbates afib    Pravastatin     Other reaction(s): Other (See Comments), Other (See Comments) Other reaction(s): MUSCLE PAIN Muscle pain  Other reaction(s): MUSCLE PAIN Muscle pain     Venlafaxine     Other reaction(s): Other (See Comments) Light headed Light headed    Doxazosin Palpitations    and Hypotension and Hypotension    Enalapril Itching    Other reaction(s): Other (See Comments), Other (See Comments) Cough  Cough     Losartan Palpitations   Mometasone Rash    Family History  Problem Relation Age of Onset   CVA Mother    CAD Father    CVA Father    Heart attack Sister    Hypertension Brother     Prior to Admission medications   Medication Sig Start Date End  Date Taking? Authorizing Provider  metoprolol succinate (TOPROL-XL) 25 MG 24 hr tablet Take 25 mg by mouth 2 (two) times daily. 02/13/22   [provider]  mirtazapine (REMERON) 15 MG tablet Take 15 mg by mouth at bedtime. 02/21/22   [provider]  sotalol (BETAPACE) 160 MG tablet Take 160 mg by mouth 2 (two) times daily. 02/13/22   [provider]  warfarin (COUMADIN) 2.5 MG tablet 2.5 mg on Thursday and 5 mg all other days 04/03/22   Emeterio Reeve, DO    Physical Exam: Vitals:   04/27/22 1802 04/27/22 1945 04/27/22 1946 04/27/22 2000  BP: (!) 138/94 115/79 115/79 (!) 112/97  Pulse: (!) 112 (!) 110 98 (!) 101  Resp: 20  17   Temp: (!) 97.5 F (36.4 C)     TempSrc: Oral     SpO2: 96% 98% 94% 98%  Weight:      Height:        Physical Exam Vitals and nursing note reviewed.  Constitutional:      General: She is not in acute distress.    Appearance: Normal appearance. She is not ill-appearing, toxic-appearing or diaphoretic.  HENT:     Head: Normocephalic and atraumatic.     Right Ear: Hearing and external ear normal.     Left Ear: Hearing and external ear normal.     Nose: Nose normal. No nasal deformity.     Mouth/Throat:     Lips: Pink.     Mouth: Mucous membranes are moist.     Tongue: No lesions.     Pharynx: Oropharynx is clear.  Eyes:     Extraocular Movements: Extraocular movements intact.     Pupils: Pupils are equal, round, and reactive to light.  Neck:     Vascular: No carotid bruit.  Cardiovascular:     Rate and Rhythm: Normal rate. Rhythm irregular.     Pulses: Normal pulses.     Heart sounds: Normal heart sounds.  Pulmonary:     Effort: Pulmonary effort is normal.     Breath sounds: Rales present.  Abdominal:     General: Bowel sounds are normal. There is no distension.     Palpations: Abdomen is soft. There is no mass.     Tenderness: There is no abdominal tenderness. There is no guarding.     Hernia: No hernia is present.  Musculoskeletal:     Right lower leg: Edema present.     Left lower leg: Edema present.  Skin:    General: Skin is warm.  Neurological:     General: No focal deficit present.     Mental Status: She is alert and oriented to person, place, and time.     Cranial Nerves: Cranial nerves 2-12 are intact.     Motor: Motor function is intact.  Psychiatric:        Attention and Perception: Attention normal.        Mood and Affect: Mood normal.        Speech: Speech normal.        Behavior: Behavior normal. Behavior is cooperative.        Cognition and Memory: Cognition normal.     Labs on Admission: I have personally reviewed following labs and imaging studies BMET Recent Labs  Lab 04/27/22 1805  NA 137  K 3.3*  CL 106  CO2 24  BUN 12  CREATININE  0.80  GLUCOSE 115*   Electrolytes Recent Labs  Lab 04/27/22 1805  CALCIUM 9.1   Sepsis Markers No results for input(s): "LATICACIDVEN", "PROCALCITON", "O2SATVEN" in the last 168 hours. ABG No results for input(s): "PHART", "PCO2ART", "PO2ART" in the last 168 hours. Liver Enzymes No results for input(s): "AST", "ALT", "ALKPHOS", "BILITOT", "ALBUMIN" in the last 168 hours. Cardiac Enzymes No results for input(s): "TROPONINI", "PROBNP" in the last 168 hours. No results found for: "DDIMER" Coag's No results for input(s): "APTT", "INR" in the last 168 hours.  No results found for this or any previous visit (from the past 240 hour(s)).   Current Facility-Administered Medications:    acetaminophen (TYLENOL) tablet 650 mg, 650 mg, Oral, Q6H PRN **OR** acetaminophen (TYLENOL) suppository 650 mg, 650 mg, Rectal, Q6H PRN, Para Skeans, MD   apixaban (ELIQUIS) tablet 2.5 mg, 2.5 mg, Oral, BID, Para Skeans, MD   metoprolol succinate (TOPROL-XL) 24 hr tablet 25 mg, 25 mg, Oral, BID, Posey Pronto, Gretta Cool, MD   mirtazapine (REMERON) tablet 15 mg, 15 mg, Oral, QHS, Taber Sweetser V, MD   ondansetron (ZOFRAN) tablet 4 mg, 4 mg, Oral, Q6H PRN **OR** ondansetron (ZOFRAN) injection 4 mg, 4 mg, Intravenous, Q6H PRN, Para Skeans, MD   sotalol (BETAPACE) tablet 160 mg, 160 mg, Oral, BID, Para Skeans, MD  Current Outpatient Medications:    metoprolol succinate (TOPROL-XL) 25 MG 24 hr tablet, Take 25 mg by mouth 2 (two) times daily., Disp: , Rfl:    mirtazapine (REMERON) 15 MG tablet, Take 15 mg by mouth at bedtime., Disp: , Rfl:    sotalol (BETAPACE) 160 MG tablet, Take 160 mg by mouth 2 (two) times daily., Disp: , Rfl:    warfarin (COUMADIN) 2.5 MG tablet, 2.5 mg on Thursday and 5 mg all other days, Disp: , Rfl:   COVID-19 Labs No results for input(s): "DDIMER", "FERRITIN", "LDH", "CRP" in the last 72 hours. Lab Results  Component Value Date   [place] NEGATIVE 04/02/2022    Radiological Exams  on Admission: DG Chest 2 View  Result Date: 04/27/2022 CLINICAL DATA:  Shortness of breath EXAM: CHEST - 2 VIEW COMPARISON:  04/02/2022 FINDINGS: Cardiomegaly with right chest multi lead pacer. Moderate bilateral pleural effusions and associated atelectasis or consolidation. Diffuse bilateral interstitial pulmonary opacity. Disc degenerative disease of the thoracic spine. IMPRESSION: 1. Moderate bilateral pleural effusions and associated atelectasis or consolidation. 2. Diffuse bilateral interstitial pulmonary opacity, likely edema. 3. Cardiomegaly. Electronically Signed   By: Delanna Ahmadi M.D.   On: 04/27/2022 18:34    EKG: Independently reviewed.  EKG shows A-fib with rapid ventricular response 118, significant Q waves anteriorly in leads V1 and V2 QTc of 473 normal axis   Assessment and Plan: * Bilateral lower extremity edema Recent d/c 2 weeks ago. Most recent echo shows elevated PA pressure and I suspect diastolic dysfunction. We will continue with iv diuretics and monitor creatinine and electrolytes.  Pt says she tries to not eat as much salt.    Hypokalemia Replace and follow.   Anxiety state Pt on mirtazapine which we will continue    Atrial fibrillation (HCC) Pt in a.fib rvr at 118. Currently on toprol xl, sotalol and eliquis.  Cont with current regimen and replace mag with goals of mag above 2.5.    Essential hypertension Vitals:   04/27/22 1802 04/27/22 1945 04/27/22 1946 04/27/22 2000  BP: (!) 138/94 115/79 115/79 (!) 112/97  As BP is low end we will hold pt's metoprolol and cont sotalol to allow room for diuresis.  Fall precautions. Follow and  correct any electrolytes abnormality.     DVT prophylaxis:  Eliquis    Code Status:  Full code    Family Communication:  Bambie, Pizzolato (Son)  [phone] [facility])   Disposition Plan:  Home    Consults called:  None   Admission status: Observation.    Para Skeans MD [facility]  6 PM- 2 AM. Please  contact me via secure Chat 6 PM-2 AM. [phone] ( Pager ) To contact the [facility] Attending or Consulting provider [place] or covering provider during after hours [place], for this patient.   Check the care team in [facility] and look for a) attending/consulting [facility] provider listed and b) the [facility] team listed Log into [email or web address] and use [place]'s universal password to access. If you do not have the password, please contact the hospital operator. Locate the [facility] provider you are looking for under [facility] and page to a number that you can be directly reached. If you still have difficulty reaching the provider, please page the [facility] (Director on Call) for the Hospitalists listed on amion for assistance. [email or web address] 04/27/2022, 9:11 PM

## 2022-04-27 NOTE — Assessment & Plan Note (Addendum)
Replete and recheck ?

## 2022-04-27 NOTE — Assessment & Plan Note (Addendum)
  Continue metoprolol, amlodipine, amiodarone

## 2022-04-28 ENCOUNTER — Encounter: Payer: Self-pay | Admitting: Internal Medicine

## 2022-04-28 DIAGNOSIS — E876 Hypokalemia: Secondary | ICD-10-CM

## 2022-04-28 DIAGNOSIS — F411 Generalized anxiety disorder: Secondary | ICD-10-CM

## 2022-04-28 DIAGNOSIS — I4821 Permanent atrial fibrillation: Secondary | ICD-10-CM

## 2022-04-28 DIAGNOSIS — R600 Localized edema: Secondary | ICD-10-CM | POA: Diagnosis not present

## 2022-04-28 MED ORDER — FUROSEMIDE 40 MG PO TABS: 40.0000 mg | ORAL_TABLET | Freq: Every day | ORAL | Status: DC

## 2022-04-28 MED ORDER — FUROSEMIDE 10 MG/ML IJ SOLN
40.0000 mg | Freq: Every day | INTRAMUSCULAR | Status: DC
  Administered 2022-04-28 – 2022-04-30 (×3): 40 mg via INTRAVENOUS
  Filled 2022-04-28 (×3): qty 4

## 2022-04-28 MED ORDER — AMLODIPINE BESYLATE 5 MG PO TABS
5.0000 mg | ORAL_TABLET | Freq: Every day | ORAL | Status: DC
Start: 2022-04-28 — End: 2022-04-29
  Administered 2022-04-28: 5 mg via ORAL
  Filled 2022-04-28: qty 1

## 2022-04-28 MED ORDER — FUROSEMIDE 20 MG PO TABS: 20.0000 mg | ORAL_TABLET | ORAL | Status: DC

## 2022-04-28 MED ORDER — ALPRAZOLAM 0.25 MG PO TABS
0.2500 mg | ORAL_TABLET | Freq: Three times a day (TID) | ORAL | Status: DC
  Administered 2022-04-28 – 2022-04-30 (×7): 0.25 mg via ORAL
  Filled 2022-04-28 (×7): qty 1

## 2022-04-28 MED ORDER — TRAZODONE HCL 50 MG PO TABS
25.0000 mg | ORAL_TABLET | Freq: Every day | ORAL | Status: DC
  Administered 2022-04-28 – 2022-04-29 (×2): 25 mg via ORAL
  Filled 2022-04-28 (×2): qty 1

## 2022-04-28 MED ORDER — AMIODARONE HCL 200 MG PO TABS
200.0000 mg | ORAL_TABLET | Freq: Two times a day (BID) | ORAL | Status: DC
  Administered 2022-04-28 – 2022-04-30 (×4): 200 mg via ORAL
  Filled 2022-04-28 (×4): qty 1

## 2022-04-28 NOTE — Evaluation (Signed)
Physical Therapy Evaluation Patient Details Name: Daisy Parks MRN: [id number] DOB: November 03, 1931 Today's Date: 04/28/2022  History of Present Illness  Pt is an 88 y/o F admitted on 04/27/22 after presenting to the ED with c/o BLE edema for the past few days. PMH: paroxysmal a-fib, HTN, heart disease, syncope & collapse, breast CA with lumpectomy  Clinical Impression  Pt seen for PT evaluation with pt reporting she was mod I with SPC living in a 1 level home (basement apartment where her son is residing) with 3 steps with R rail to enter. Pt denies falls in the past 6 months. Pt is able to complete bed mobility with mod I, STS with close supervision & ambulates 1 lap around nurses station with personal walking stick with supervision. When using walking stick, pt carries it as much as she places it on the ground but demonstrates decent balance overall. Will continue to follow pt acutely to address high level balance, endurance, and gait & stairs with LRAD.      Recommendations for follow up therapy are one component of a multi-disciplinary discharge planning process, led by the attending physician.  Recommendations may be updated based on patient status, additional functional criteria and insurance authorization.  Follow Up Recommendations No PT follow up      Assistance Recommended at Discharge PRN  Patient can return home with the following  A little help with walking and/or transfers;A little help with bathing/dressing/bathroom;Assistance with cooking/housework    Equipment Recommendations None recommended by PT  Recommendations for Other Services       Functional Status Assessment Patient has had a recent decline in their functional status and demonstrates the ability to make significant improvements in function in a reasonable and predictable amount of time.     Precautions / Restrictions Precautions Precautions: Fall Restrictions Weight Bearing Restrictions: No      Mobility  Bed  Mobility Overal bed mobility: Modified Independent             General bed mobility comments: supine>sit with HOB slightly elevated    Transfers Overall transfer level: Needs assistance Equipment used: Straight cane Transfers: Sit to/from Stand Sit to Stand: Supervision           General transfer comment: STS from EOB with close supervision    Ambulation/Gait Ambulation/Gait assistance: Supervision Gait Distance (Feet): 175 Feet Assistive device: Straight cane (personal walking stick) Gait Pattern/deviations: Decreased step length - right, Decreased step length - left, Decreased stride length Gait velocity: slightly decreased        Stairs            Wheelchair Mobility    Modified Rankin (Stroke Patients Only)       Balance Overall balance assessment: Needs assistance Sitting-balance support: Feet supported Sitting balance-Leahy Scale: Good     Standing balance support: During functional activity, No upper extremity supported Standing balance-Leahy Scale: Good                               Pertinent Vitals/Pain Pain Assessment Pain Assessment:  (no active c/o pain during session but pt reported she was feeling discomfort in her chest "like something was bubbling up" prior to admission)    Home Living Family/patient expects to be discharged to:: Private residence Living Arrangements: Parent Available Help at Discharge: Family;Available 24 hours/day Type of Home: House Home Access: Stairs to enter Entrance Stairs-Rails: Right Entrance Stairs-Number of Steps: 2-3 Alternate Level  Stairs-Number of Steps: pt has apartment in her basement where her son is staying but she does not have to go down to basement Home Layout: Multi-level Home Equipment: Rollator (4 wheels) (walking stick) Additional Comments: Pt reports she has not been feeling well & was starting to sleep a lot more at home prior to admission.    Prior Function Prior Level  of Function : Independent/Modified Independent;Driving             Mobility Comments: Pt reports she's independent with gait, stair negotiation, denies falls in the past 6 months. Pt reports she drives but very little. ADLs Comments: Pt reports she's independent with cooking, cleaning, bathing, dressing.     Hand Dominance        Extremity/Trunk Assessment   Upper Extremity Assessment Upper Extremity Assessment: Overall WFL for tasks assessed    Lower Extremity Assessment Lower Extremity Assessment: Generalized weakness (BLE edema but pt reports they are typically swollen at baseline)    Cervical / Trunk Assessment Cervical / Trunk Assessment: Normal  Communication   Communication: HOH (even with hearing aides)  Cognition Arousal/Alertness: Awake/alert Behavior During Therapy: WFL for tasks assessed/performed Overall Cognitive Status: Within Functional Limits for tasks assessed                                 General Comments: sweet lady        General Comments General comments (skin integrity, edema, etc.): vitals at beginning of session in bed: BP 141/97 mmHg (MAP 111), HR 96 bpm, SpO2 96% on room air    Exercises     Assessment/Plan    PT Assessment Patient needs continued PT services  PT Problem List Decreased strength;Decreased activity tolerance;Decreased balance;Decreased mobility       PT Treatment Interventions DME instruction;Therapeutic exercise;Gait training;Balance training;Stair training;Functional mobility training;Therapeutic activities;Patient/family education;Neuromuscular re-education    PT Goals (Current goals can be found in the Care Plan section)  Acute Rehab PT Goals Patient Stated Goal: feel better PT Goal Formulation: With patient Time For Goal Achievement: 05/12/22 Potential to Achieve Goals: Good    Frequency Min 2X/week     Co-evaluation               AM-PAC PT "6 Clicks" Mobility  Outcome Measure Help  needed turning from your back to your side while in a flat bed without using bedrails?: None Help needed moving from lying on your back to sitting on the side of a flat bed without using bedrails?: None Help needed moving to and from a bed to a chair (including a wheelchair)?: None Help needed standing up from a chair using your arms (e.g., wheelchair or bedside chair)?: None Help needed to walk in hospital room?: A Little Help needed climbing 3-5 steps with a railing? : A Little 6 Click Score: 22    End of Session   Activity Tolerance: Patient tolerated treatment well Patient left: in chair;with chair alarm set;with call bell/phone within reach Nurse Communication: Mobility status PT Visit Diagnosis: Muscle weakness (generalized) (M62.81);Unsteadiness on feet (R26.81)    Time: 5284-1324 PT Time Calculation (min) (ACUTE ONLY): 16 min   Charges:   PT Evaluation $PT Eval Moderate Complexity: [place], PT, DPT 04/28/22, 9:44 AM   Waunita Schooner 04/28/2022, 9:43 AM

## 2022-04-28 NOTE — Evaluation (Signed)
Occupational Therapy Evaluation Patient Details Name: Daisy Parks MRN: [id number] DOB: Apr 24, 1932 Today's Date: 04/28/2022   History of Present Illness Pt is an 88 y/o F admitted on 04/27/22 after presenting to the ED with c/o BLE edema for the past few days. PMH: paroxysmal a-fib, HTN, heart disease, syncope & collapse, breast CA with lumpectomy   Clinical Impression   Pt was seen for OT evaluation this date. Prior to hospital admission, pt was generally independent with ADL and most IADL, living on the main fl of her home by herself and her son lives in the basement apartment. Daughter stops by nearly daily. Pt denies falls, alert and oriented x4 once gently woken up at start of session. No direct assist required for bed mobility and transfers from EOB and commode with supervision and walking stick. Ambulates in the room with supervision/SBA For safety. Endorses fatigue at the end. Pt educated in falls prevention and energy conservation strategies including activty pacing and work simplification to support safety and independence for activities such as bathing, getting her mail, and other activities. Pt educated in weekly pill box to help improve safety/ease with managing medications, as pt endorses several recent medication changes. Currently pt demonstrates impairments as described below (See OT problem list) which functionally limit her ability to perform ADL/self-care tasks at baseline independence. Pt would benefit from skilled OT services to address noted impairments and functional limitations (see below for any additional details) in order to maximize safety and independence while minimizing falls risk and caregiver burden. Do not anticipate skilled OT needs upon discharge. 3   Recommendations for follow up therapy are one component of a multi-disciplinary discharge planning process, led by the attending physician.  Recommendations may be updated based on patient status, additional functional  criteria and insurance authorization.   Follow Up Recommendations  No OT follow up    Assistance Recommended at Discharge Intermittent Supervision/Assistance  Patient can return home with the following A little help with bathing/dressing/bathroom;Assistance with cooking/housework;Assist for transportation;Help with stairs or ramp for entrance    Functional Status Assessment  Patient has had a recent decline in their functional status and demonstrates the ability to make significant improvements in function in a reasonable and predictable amount of time.  Equipment Recommendations  None recommended by OT    Recommendations for Other Services       Precautions / Restrictions Precautions Precautions: Fall Restrictions Weight Bearing Restrictions: No      Mobility Bed Mobility Overal bed mobility: Modified Independent                  Transfers Overall transfer level: Needs assistance Equipment used: Straight cane Transfers: Sit to/from Stand Sit to Stand: Supervision           General transfer comment: supv-SBA      Balance Overall balance assessment: Needs assistance Sitting-balance support: Feet supported Sitting balance-Leahy Scale: Good     Standing balance support: During functional activity, No upper extremity supported Standing balance-Leahy Scale: Good                             ADL either performed or assessed with clinical judgement   ADL                                         General ADL Comments: Pt doffs/dons  socks with mod indep, completes all aspects of toileting with remote supervision, and completes ADL transfers with supervision.     Vision         Perception     Praxis      Pertinent Vitals/Pain Pain Assessment Pain Assessment: No/denies pain     Hand Dominance     Extremity/Trunk Assessment Upper Extremity Assessment Upper Extremity Assessment: Overall WFL for tasks assessed   Lower  Extremity Assessment Lower Extremity Assessment: Generalized weakness (BLE edema, but pt endorsing improved since admission)   Cervical / Trunk Assessment Cervical / Trunk Assessment: Normal   Communication Communication Communication: HOH (even with hearing aides)   Cognition Arousal/Alertness: Awake/alert Behavior During Therapy: WFL for tasks assessed/performed Overall Cognitive Status: Within Functional Limits for tasks assessed                                       General Comments       Exercises Other Exercises Other Exercises: Pt educated in falls prevention and energy conservation strategies including activty pacing and work simplification to support safety and independence for activities such as bathing, getting her mail, and other activities. Pt educated in weekly pill box to help improve safety/ease with managing medications, as pt endorses several recent medication changes.   Shoulder Instructions      Home Living Family/patient expects to be discharged to:: Private residence Living Arrangements: Children (son lives in the basement apartment) Available Help at Discharge: Family;Available 24 hours/day (son lives in basement, daughter stops by almost daily) Type of Home: House Home Access: Stairs to enter Technical brewer of Steps: 2-3 Entrance Stairs-Rails: Right Home Layout: Multi-level Alternate Level Stairs-Number of Steps: pt has apartment in her basement where her son is staying but she does not have to go down to basement   Bathroom Shower/Tub: Tub/shower unit         Home Equipment: Rollator (4 wheels);Other (comment) (walking stick)   Additional Comments: Pt reports she has not been feeling well & was starting to sleep a lot more at home prior to admission.      Prior Functioning/Environment Prior Level of Function : Independent/Modified Independent;Driving             Mobility Comments: Pt reports she's independent with  gait, stair negotiation, denies falls in the past 6 months. Pt reports she drives but very little. ADLs Comments: Pt reports she's independent with cooking, cleaning, bathing, dressing as well as medication mgt and financial mgt. pt gets her mail from the mailbox but does report this has been much harder lately.        OT Problem List: Decreased strength;Decreased activity tolerance;Decreased knowledge of use of DME or AE;Impaired balance (sitting and/or standing)      OT Treatment/Interventions: Self-care/ADL training;Therapeutic exercise;Therapeutic activities;Energy conservation;DME and/or AE instruction;Patient/family education;Balance training    OT Goals(Current goals can be found in the care plan section) Acute Rehab OT Goals Patient Stated Goal: go home OT Goal Formulation: With patient/family Time For Goal Achievement: 05/12/22 Potential to Achieve Goals: Good ADL Goals Additional ADL Goal #1: Pt will complete all aspects of bathing, primarily seated, with supervision for safety, 2/2 opportunities. Additional ADL Goal #2: Pt will verbalize plan to implement at least 2 learned ECS into daily ADL routine to maximize safety/indep.  OT Frequency: Min 2X/week    Co-evaluation  AM-PAC OT "6 Clicks" Daily Activity     Outcome Measure Help from another person eating meals?: None Help from another person taking care of personal grooming?: None Help from another person toileting, which includes using toliet, bedpan, or urinal?: A Little Help from another person bathing (including washing, rinsing, drying)?: A Little Help from another person to put on and taking off regular upper body clothing?: None Help from another person to put on and taking off regular lower body clothing?: None 6 Click Score: 22   End of Session Equipment Utilized During Treatment: Other (comment) (walking stick)  Activity Tolerance: Patient tolerated treatment well Patient left: in bed;with  bed alarm set;with family/visitor present  OT Visit Diagnosis: Other abnormalities of gait and mobility (R26.89);Muscle weakness (generalized) (M62.81)                Time: 7564-3329 OT Time Calculation (min): 16 min Charges:  OT General Charges $OT Visit: 1 Visit OT Evaluation $OT Eval Low Complexity: 1 Low OT Treatments $Self Care/Home Management : 8-22 mins  Ardeth Perfect., MPH, MS, OTR/L ascom [phone] 04/28/22, 4:00 PM

## 2022-04-28 NOTE — Plan of Care (Signed)

## 2022-04-28 NOTE — Hospital Course (Addendum)
88 year old female with history of paroxysmal atrial fibrillation on warfarin, sotalol, metoprolol, hypertension, anxiety, hyperlipidemia admitted for shortness of breath  8/7: Patient and her daughter reports insomnia and anxiety contributing to her symptoms.  Added Xanax and increased Lasix to 40 mg IV daily.  Palliative care consult 8/8: Cardiology consult   8/9: Patient appears euvolemic and wants to go home.  Labs seems stable. Heart rate well-controlled on amiodarone and Eliquis. Per cardiology will need cardioversion in 2 to 3 weeks which can be done as an outpatient.  Cardiology made her home Lasix daily instead of every other day. She will continue on amiodarone 200 mg twice daily until 05/06/2022 followed by daily.  She will continue the rest of her home medications and need to have a close follow-up with her cardiologist for further recommendations.

## 2022-04-28 NOTE — Progress Notes (Signed)
  Progress Note   Patient: Daisy Parks [id number]:[id number] DOB: 1931-11-16 DOA: 04/27/2022     0 DOS: the patient was seen and examined on 04/28/2022   Brief hospital course: 88 year old female with history of paroxysmal atrial fibrillation on warfarin, sotalol, metoprolol, hypertension, anxiety, hyperlipidemia admitted for shortness of breath  8/7: Patient and her daughter reports insomnia and anxiety contributing to her symptoms.  Added Xanax and increased Lasix to 40 mg IV daily.  Palliative care consult   Assessment and Plan: * Bilateral lower extremity edema Recent d/c 2 weeks ago. Most recent echo shows elevated PA pressure and may be diastolic dysfunction. Increase Lasix to 40 mg IV daily for now    Hypokalemia Replete and recheck  Anxiety state We will restart Xanax.  She has taken Xanax, paroxetine, venlafaxine and Celexa in the past but currently on none.  Her anxiety and insomnia seem to be her biggest issue.  We will add trazodone for insomnia    Atrial fibrillation (HCC) Rate controlled with amiodarone and Toprol eliquis for anticoagulation Anxiety seem to be her biggest driver of most of her symptoms    Essential hypertension  Continue metoprolol, amlodipine, amiodarone        Subjective: Patient reports not been able to sleep well last several days since her discharge from the hospital about 2 weeks ago.  She has also been feeling anxious  Physical Exam: Vitals:   04/28/22 0432 04/28/22 0500 04/28/22 1203 04/28/22 1439  BP: (!) 144/103  125/85 120/74  Pulse: (!) 108  84 (!) 54  Resp: '18  19 17  '$ Temp: (!) 97.4 F (36.3 C)  97.8 F (36.6 C) 97.8 F (36.6 C)  TempSrc:    Oral  SpO2: 97%  98% 93%  Weight:  73.7 kg    Height:  '5\' 5"'$  (1.60 m)     88 year old female lying comfortably in the bed without any acute distress Lungs clear to auscultation bilaterally Cardiovascular irregularly irregular heart rate Abdomen soft, benign Neuro alert and awake,  nonfocal Skin no rash or lesion Psych anxious Data Reviewed:  Potassium 3.3, INR 1.7, BNP 574.4  Family Communication: Updated daughter over phone.  I tried son but could not get hold of him  Disposition: Status is: Observation The patient remains OBS appropriate and will d/c before 2 midnights.  Planned Discharge Destination: Home    DVT prophylaxis-Eliquis Time spent: 35 minutes  Author: Max Sane, MD 04/28/2022 2:53 PM  For on call review [email or web address].

## 2022-04-29 DIAGNOSIS — E876 Hypokalemia: Secondary | ICD-10-CM | POA: Diagnosis present

## 2022-04-29 DIAGNOSIS — J90 Pleural effusion, not elsewhere classified: Secondary | ICD-10-CM

## 2022-04-29 DIAGNOSIS — Z853 Personal history of malignant neoplasm of breast: Secondary | ICD-10-CM | POA: Diagnosis not present

## 2022-04-29 DIAGNOSIS — Z823 Family history of stroke: Secondary | ICD-10-CM | POA: Diagnosis not present

## 2022-04-29 DIAGNOSIS — Y92239 Unspecified place in hospital as the place of occurrence of the external cause: Secondary | ICD-10-CM | POA: Diagnosis present

## 2022-04-29 DIAGNOSIS — Z7189 Other specified counseling: Secondary | ICD-10-CM

## 2022-04-29 DIAGNOSIS — E785 Hyperlipidemia, unspecified: Secondary | ICD-10-CM | POA: Diagnosis present

## 2022-04-29 DIAGNOSIS — Z515 Encounter for palliative care: Secondary | ICD-10-CM | POA: Diagnosis not present

## 2022-04-29 DIAGNOSIS — R600 Localized edema: Secondary | ICD-10-CM | POA: Diagnosis not present

## 2022-04-29 DIAGNOSIS — T502X5A Adverse effect of carbonic-anhydrase inhibitors, benzothiadiazides and other diuretics, initial encounter: Secondary | ICD-10-CM | POA: Diagnosis present

## 2022-04-29 DIAGNOSIS — I2510 Atherosclerotic heart disease of native coronary artery without angina pectoris: Secondary | ICD-10-CM | POA: Diagnosis present

## 2022-04-29 DIAGNOSIS — I110 Hypertensive heart disease with heart failure: Secondary | ICD-10-CM | POA: Diagnosis present

## 2022-04-29 DIAGNOSIS — I5033 Acute on chronic diastolic (congestive) heart failure: Secondary | ICD-10-CM | POA: Diagnosis present

## 2022-04-29 DIAGNOSIS — Z79899 Other long term (current) drug therapy: Secondary | ICD-10-CM | POA: Diagnosis not present

## 2022-04-29 DIAGNOSIS — Z950 Presence of cardiac pacemaker: Secondary | ICD-10-CM | POA: Diagnosis not present

## 2022-04-29 DIAGNOSIS — G4700 Insomnia, unspecified: Secondary | ICD-10-CM | POA: Diagnosis present

## 2022-04-29 DIAGNOSIS — I4819 Other persistent atrial fibrillation: Secondary | ICD-10-CM

## 2022-04-29 DIAGNOSIS — F411 Generalized anxiety disorder: Secondary | ICD-10-CM | POA: Diagnosis present

## 2022-04-29 DIAGNOSIS — Z8249 Family history of ischemic heart disease and other diseases of the circulatory system: Secondary | ICD-10-CM | POA: Diagnosis not present

## 2022-04-29 DIAGNOSIS — I10 Essential (primary) hypertension: Secondary | ICD-10-CM

## 2022-04-29 DIAGNOSIS — I480 Paroxysmal atrial fibrillation: Secondary | ICD-10-CM | POA: Diagnosis present

## 2022-04-29 DIAGNOSIS — I495 Sick sinus syndrome: Secondary | ICD-10-CM | POA: Diagnosis present

## 2022-04-29 DIAGNOSIS — Z66 Do not resuscitate: Secondary | ICD-10-CM | POA: Diagnosis not present

## 2022-04-29 DIAGNOSIS — Z7901 Long term (current) use of anticoagulants: Secondary | ICD-10-CM | POA: Diagnosis not present

## 2022-04-29 LAB — BASIC METABOLIC PANEL
Anion gap: 6 (ref 5–15)
BUN: 13 mg/dL (ref 8–23)
CO2: 26 mmol/L (ref 22–32)
Calcium: 9 mg/dL (ref 8.9–10.3)
Chloride: 110 mmol/L (ref 98–111)
Creatinine, Ser: 0.74 mg/dL (ref 0.44–1.00)
GFR, Estimated: >60 mL/min (ref >60)
Glucose, Bld: 116 mg/dL — ABNORMAL HIGH (ref 70–99)
Potassium: 3.1 mmol/L — ABNORMAL LOW (ref 3.5–5.1)
Sodium: 142 mmol/L (ref 135–145)

## 2022-04-29 LAB — CBC
HCT: 40.4 % (ref 36.0–46.0)
Hemoglobin: 13.3 g/dL (ref 12.0–15.0)
MCH: 30.7 pg (ref 26.0–34.0)
MCHC: 32.9 g/dL (ref 30.0–36.0)
MCV: 93.3 fL (ref 80.0–100.0)
Platelets: 197 10*3/uL (ref 150–400)
RBC: 4.33 MIL/uL (ref 3.87–5.11)
RDW: 13 % (ref 11.5–15.5)
WBC: 6.3 10*3/uL (ref 4.0–10.5)
nRBC: 0 % (ref 0.0–0.2)

## 2022-04-29 MED ORDER — POTASSIUM CHLORIDE CRYS ER 20 MEQ PO TBCR
40.0000 meq | EXTENDED_RELEASE_TABLET | Freq: Once | ORAL | Status: AC
  Administered 2022-04-29: 40 meq via ORAL
  Filled 2022-04-29: qty 2

## 2022-04-29 NOTE — Plan of Care (Signed)

## 2022-04-29 NOTE — Progress Notes (Signed)
  Progress Note   Patient: Daisy Parks [id number]:[id number] DOB: 1932-01-20 DOA: 04/27/2022     0 DOS: the patient was seen and examined on 04/29/2022   Brief hospital course: 87 year old female with history of paroxysmal atrial fibrillation on warfarin, sotalol, metoprolol, hypertension, anxiety, hyperlipidemia admitted for shortness of breath  8/7: Patient and her daughter reports insomnia and anxiety contributing to her symptoms.  Added Xanax and increased Lasix to 40 mg IV daily.  Palliative care consult 8/8: Cardiology consult    Assessment and Plan: * Bilateral lower extremity edema Recent d/c 2 weeks ago. Most recent echo shows elevated PA pressure and may be diastolic dysfunction. Continue Lasix to 40 mg IV daily for now    Hypokalemia Replete and recheck.  Likely due to diuresis from Lasix may need scheduled potassium  Anxiety state Continue Xanax which was restarted this admission.  She has taken Xanax, paroxetine, venlafaxine and Celexa in the past, trazodone added for insomnia    Atrial fibrillation ([place]) Rate controlled with amiodarone and Toprol eliquis for anticoagulation Cardiology consult.  Patient interested in switching her cardiologist from [facility] to someone locally    Essential hypertension Continue metoprolol, amiodarone Cardiology consult        Subjective: Worried about her A-fib and shortness of breath.  Sitting in the chair.  She is interested in switching her cardiology locally instead of going to [facility]  Physical Exam: Vitals:   04/29/22 0803 04/29/22 1156 04/29/22 1518 04/29/22 2011  BP: 109/60 115/77 127/78 108/80  Pulse: 62 83 91 61  Resp: '16 16 16 20  '$ Temp: 97.7 F (36.5 C) 97.9 F (36.6 C) 97.8 F (36.6 C) (!) 97.5 F (36.4 C)  TempSrc: Oral  Oral Oral  SpO2: 91% (!) 86% 97% 94%  Weight:      Height:       88 year old female lying comfortably in the bed without any acute distress Lungs clear to auscultation bilaterally Cardiovascular  irregularly irregular heart rate Abdomen soft, benign Neuro alert and awake, nonfocal Skin no rash or lesion Psych anxious Data Reviewed:  Potassium 3.1  Family Communication: Daughter was updated yesterday  Disposition: Status is: Inpatient Remains inpatient appropriate because: Ongoing diuresis and cardiology consult   Planned Discharge Destination: Home with Home Health    DVT prophylaxis-Eliquis Time spent: 35 minutes  Author: Max Sane, MD 04/29/2022 10:13 PM  For on call review [email or web address].

## 2022-04-29 NOTE — Progress Notes (Signed)
Occupational Therapy Treatment Patient Details Name: Daisy Parks MRN: [id number] DOB: 15-Jul-1932 Today's Date: 04/29/2022   History of present illness Pt is an 88 y/o F admitted on 04/27/22 after presenting to the ED with c/o BLE edema for the past few days. PMH: paroxysmal a-fib, HTN, heart disease, syncope & collapse, breast CA with lumpectomy   OT comments  Pt seen for OT tx. Pt completed bed mobility with mod indep, transfers with SBA-CGA using walking stick and ambulated around the nurses station 1 lap with CGA and walking stick, mildly unsteady compared to evaluation previous date. Pt educated in activity pacing and ECS during walk. May benefit from RW use versus walking stick to improve safety/balance with mobility, as she tends to reach out for additional furniture while in the room. Recommendation updated to [facility] to maximize return to PLOF.    Recommendations for follow up therapy are one component of a multi-disciplinary discharge planning process, led by the attending physician.  Recommendations may be updated based on patient status, additional functional criteria and insurance authorization.    Follow Up Recommendations  Home health OT    Assistance Recommended at Discharge Intermittent Supervision/Assistance  Patient can return home with the following  A little help with bathing/dressing/bathroom;Assistance with cooking/housework;Assist for transportation;Help with stairs or ramp for entrance;A little help with walking and/or transfers   Equipment Recommendations  None recommended by OT    Recommendations for Other Services      Precautions / Restrictions Precautions Precautions: Fall Restrictions Weight Bearing Restrictions: No       Mobility Bed Mobility Overal bed mobility: Modified Independent                  Transfers Overall transfer level: Needs assistance Equipment used: Straight cane Transfers: Sit to/from Stand Sit to Stand: Supervision, Min  guard                 Balance Overall balance assessment: Needs assistance Sitting-balance support: Feet supported Sitting balance-Leahy Scale: Good     Standing balance support: During functional activity, No upper extremity supported, Single extremity supported Standing balance-Leahy Scale: Fair                             ADL either performed or assessed with clinical judgement   ADL                                              Extremity/Trunk Assessment              Vision       Perception     Praxis      Cognition Arousal/Alertness: Awake/alert Behavior During Therapy: WFL for tasks assessed/performed Overall Cognitive Status: Within Functional Limits for tasks assessed                                          Exercises Other Exercises Other Exercises: Pt ambulated in hall, 1 lap around nurses station with CGA and walking stick use, mildly unsteady, educated in activity pacing and ECS during walk    Shoulder Instructions       General Comments HR up to 109 wiht mobility, on room air SpO2 91-93% with exertion, up  to >94-95% at rest after on room air. RN notified, left off O2    Pertinent Vitals/ Pain       Pain Assessment Pain Assessment: No/denies pain  Home Living                                          Prior Functioning/Environment              Frequency  Min 2X/week        Progress Toward Goals  OT Goals(current goals can now be found in the care plan section)  Progress towards OT goals: Progressing toward goals  Acute Rehab OT Goals Patient Stated Goal: go home OT Goal Formulation: With patient/family Time For Goal Achievement: 05/12/22 Potential to Achieve Goals: Good  Plan Frequency remains appropriate;Discharge plan needs to be updated    Co-evaluation                 AM-PAC OT "6 Clicks" Daily Activity     Outcome Measure   Help from  another person eating meals?: None Help from another person taking care of personal grooming?: None Help from another person toileting, which includes using toliet, bedpan, or urinal?: A Little Help from another person bathing (including washing, rinsing, drying)?: A Little Help from another person to put on and taking off regular upper body clothing?: None Help from another person to put on and taking off regular lower body clothing?: A Little 6 Click Score: 21    End of Session Equipment Utilized During Treatment: Gait belt;Other (comment) (walking stick)  OT Visit Diagnosis: Other abnormalities of gait and mobility (R26.89);Muscle weakness (generalized) (M62.81)   Activity Tolerance Patient tolerated treatment well   Patient Left in chair;with call bell/phone within reach;with chair alarm set;with family/visitor present   Nurse Communication Other (comment) (O2)        Time: 8115-7262 OT Time Calculation (min): 23 min  Charges: OT General Charges $OT Visit: 1 Visit OT Treatments $Self Care/Home Management : 23-37 mins  Ardeth Perfect., MPH, MS, OTR/L ascom [phone] 04/29/22, 4:02 PM

## 2022-04-29 NOTE — Assessment & Plan Note (Signed)
Continue metoprolol, amiodarone Cardiology consult

## 2022-04-29 NOTE — Assessment & Plan Note (Signed)
Rate controlled with amiodarone and Toprol eliquis for anticoagulation Cardiology consult.  Patient interested in switching her cardiologist from [facility] to someone locally

## 2022-04-29 NOTE — Consult Note (Signed)
Cardiology Consultation:   Patient ID: Daisy Parks; [id number]; [birth year]   Admit date: 04/27/2022 Date of Consult: 04/29/2022  Primary Care Provider: Caryl Never, MD Primary Cardiologist: Previously A1c, request to transition to [place] Primary Electrophysiologist:  [facility]   Patient Profile:   Daisy Parks is a 88 y.o. female with a hx of PAF complicated by posttermination pauses status post dual-chamber PPM in 06/2018 at [facility], breast cancer status postlumpectomy, HTN, and HLD, who is being seen today for the evaluation of A-fib and volume overload at the request of Dr. Manuella Parks.  History of Present Illness:   [name] has a long history of PAF that dates back at least to 2014.  Historically, she has been followed by [facility] cardiology.  With most recent ischemic evaluation in 06/2013 being low risk with a normal LVEF.  She underwent outpatient cardiac monitoring in 06/2018 which demonstrated a 23% A-fib burden with 15 pauses with the longest being 4.8 seconds.  In this setting, she underwent dual-chamber pacemaker implantation in 06/2018.  She has been managed with warfarin and sotalol up until recently when she was found to have a supratherapeutic INR for uncertain reasons and noted to have a significant A-fib burden on sotalol ranging from upper 20s to 30s percent.  She was admitted to [facility] in 03/2022 with A-fib with RVR and volume overload.  She was gently diuresed.  Echo during that admission showed an EF of 50 to 55%, no regional wall motion abnormalities, mild LVH, normal RV systolic function and ventricular cavity size, mildly elevated PASP estimated at 38.4 mmHg, mildly dilated left atrium, mild to moderate mitral regurgitation, and an estimated right atrial pressure of 8 mmHg.  Chest x-ray showed small left pleural effusion with overlying left basilar consolidation as well as mild diffuse interstitial opacities suspicious for mild interstitial edema.  BNP during that admission of  874.  High-sensitivity troponin negative x2.  She followed up with [facility] cardiology and was transitioned from warfarin and sotalol to apixaban and amiodarone  At that time, device interrogation demonstrated a 100% A-fib burden.  She was admitted to [facility] on 04/27/2022 with recurrent dyspnea and tachypalpitations.  She was found to be in A-fib with RVR and volume overloaded.  Repeat BMP improved to 574.  High-sensitivity troponin remained negative x2.  Chest x-ray notable for moderate bilateral pleural effusions and associated atelectasis versus consolidation as well as diffuse bilateral interstitial pulmonary opacity felt to be representative of edema and cardiomegaly.  She has been diuresed, though does not report any significant change in her dyspnea.  She continues to require supplemental oxygen via nasal cannula at 2 to 3 L.  Documented urine output 880 mL for the admission.  Currently, she remains in A-fib with improved ventricular response with rates in the 70s to 90s bpm.  She reports when she is in symptomatic A-fib she feels "jittery."  She has not had any falls in the past 12 months, though has had several "stumbles."  No symptoms concerning for bleeding on anticoagulation.  She reports her weight has trended up 6 to 7 pounds over the past week.  She has stable 1-2 pillow orthopnea.    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 Authorizing Provider  amiodarone (PACERONE) 200 MG tablet Take 200 mg by mouth 2 (two) times daily. 04/17/22  Yes [provider]  amLODipine (NORVASC) 5 MG tablet Take 5 mg by mouth daily.   Yes [provider]  apixaban (ELIQUIS) 5 MG TABS tablet Take 5 mg by mouth 2 (two) times daily. 04/22/22  Yes [provider]  furosemide (LASIX) 20 MG tablet Take 20 mg by mouth every other day. 04/17/22  Yes [provider]  metoprolol succinate (TOPROL-XL) 25 MG 24 hr tablet Take 25 mg by mouth 2 (two) times daily. 02/13/22  Yes [provider]  mirtazapine (REMERON) 15 MG tablet Take 15 mg by mouth at bedtime. Patient not taking: Reported on 04/27/2022 02/21/22   [provider]  sotalol (BETAPACE) 160 MG tablet Take 160 mg by mouth 2 (two) times daily. Patient not taking: Reported on 04/27/2022 02/13/22   [provider]  warfarin (COUMADIN) 2.5 MG tablet 2.5 mg on Thursday and 5 mg all other days Patient not taking: Reported on 04/27/2022 04/03/22   Emeterio Reeve, DO    Inpatient Medications: Scheduled Meds:  ALPRAZolam  0.25 mg Oral TID   amiodarone  200 mg Oral BID   amLODipine  5 mg Oral Daily   apixaban  5 mg Oral BID   furosemide  40 mg Intravenous Daily   metoprolol succinate  25 mg Oral BID   traZODone  25 mg Oral QHS   Continuous Infusions:  PRN Meds: acetaminophen **OR** acetaminophen, ondansetron **OR** ondansetron (ZOFRAN) IV  Allergies:   Allergies  Allergen Reactions   Diltiazem     Other reaction(s): Dizziness dizzy dizzy    Hydrochlorothiazide     Other reaction(s): Dizziness   Chlorthalidone Other (See Comments)    Other reaction(s): Other (See Comments) Exacerbates afib Exacerbates afib    Pravastatin     Other reaction(s): Other (See Comments), Other (See Comments) Other reaction(s): MUSCLE PAIN Muscle pain  Other reaction(s): MUSCLE PAIN Muscle pain     Venlafaxine     Other reaction(s): Other (See Comments) Light headed Light headed    Doxazosin Palpitations    and Hypotension and Hypotension    Enalapril  Itching    Other reaction(s): Other (See Comments), Other (See Comments) Cough  Cough     Losartan Palpitations   Mometasone Rash    Social History:   Social History   Socioeconomic History   Marital status: Married    Spouse name: Not on file   Number of children: Not on file   Years of education: Not on file   Highest education level: Not on file  Occupational History   Not on file  Tobacco Use   Smoking status: Parks   Smokeless tobacco: Parks  Substance and Sexual Activity   Alcohol use: No    Alcohol/week: 0.0 standard drinks of alcohol   Drug use: No   Sexual activity: Not Currently  Other Topics Concern   Not on file  Social History Narrative   Not on file   Social Determinants of Health   Financial Resource Strain: Not on file  Food Insecurity: Not on file  Transportation Needs: Not on file  Physical Activity: Not on file  Stress: Not on file  Social Connections: Not on file  Intimate Partner Violence: Not on file     Family History:   Family History  Problem Relation Age of Onset   CVA Mother    CAD Father    CVA Father    Heart attack Sister    Hypertension Brother     ROS:  Review of Systems  Constitutional:  Positive for malaise/fatigue. Negative for chills, diaphoresis, fever and weight loss.  HENT:  Negative for congestion.   Eyes:  Negative for discharge and redness.  Respiratory:  Positive for shortness of breath. Negative for cough, sputum production and wheezing.   Cardiovascular:  Positive for palpitations. Negative for chest pain, orthopnea, claudication, leg swelling and PND.  Gastrointestinal:  Negative for abdominal pain, blood in stool, heartburn, melena, nausea and vomiting.  Musculoskeletal:  Negative for falls and myalgias.  Skin:  Negative for rash.  Neurological:  Negative for dizziness, tingling, tremors, sensory change, speech change, focal weakness, loss of consciousness and weakness.  Endo/Heme/Allergies:  Does not  bruise/bleed easily.  Psychiatric/Behavioral:  Negative for substance abuse. The patient is nervous/anxious.   All other systems reviewed and are negative.     Physical Exam/Data:   Vitals:   04/29/22 0335 04/29/22 0450 04/29/22 0803 04/29/22 1156  BP:   109/60 115/77  Pulse:   62 83  Resp:   16 16  Temp:   97.7 F (36.5 C) 97.9 F (36.6 C)  TempSrc:   Oral   SpO2: 93% 93% 91% (!) 86%  Weight:      Height:        Intake/Output Summary (Last 24 hours) at 04/29/2022 1420 Last data filed at 04/29/2022 1051 Gross per 24 hour  Intake 600 ml  Output 700 ml  Net -100 ml   Filed Weights   04/27/22 1800 04/28/22 0500  Weight: 68.9 kg 73.7 kg   Body mass index is 27.04 kg/m.   Physical Exam: General: Well developed, well nourished, in no acute distress. Head: Normocephalic, atraumatic, sclera non-icteric, no xanthomas, nares without discharge.  Neck: Negative for carotid bruits. JVD not elevated. Lungs: Diminished breath sounds along the bilateral bases. Breathing is unlabored. Heart: Irregularly irregular with S1 S2. No murmurs, rubs, or gallops appreciated. Abdomen: Soft, non-tender, non-distended with normoactive bowel sounds. No hepatomegaly. No rebound/guarding. No obvious abdominal masses. Msk:  Strength and tone appear normal for age. Extremities: No clubbing or cyanosis.  Trivial bilateral pretibial edema. Distal pedal pulses are 2+ and equal bilaterally. Neuro: Alert and oriented X 3. No facial asymmetry. No focal deficit. Moves all extremities spontaneously. Psych:  Responds to questions appropriately with a normal affect.   EKG:  The EKG was personally reviewed and demonstrates: A-fib with RVR, 118 bpm, nonspecific ST-T changes Telemetry:  Telemetry was personally reviewed and demonstrates: A-fib with ventricular rates in the 70s to 90s with rare aberrancy and rare V paced complexes  Weights: Filed Weights   04/27/22 1800 04/28/22 0500  Weight: 68.9 kg 73.7 kg     Relevant CV Studies:  2D echo 04/03/2022: 1. Left ventricular ejection fraction, by estimation, is 50 to 55%. The  left ventricle has low normal function. The left ventricle has no regional  wall motion abnormalities. There is mild left ventricular hypertrophy.  Left ventricular diastolic  parameters are indeterminate.   2. Right ventricular systolic function is normal. The right ventricular  size is normal. There is mildly elevated pulmonary  artery systolic  pressure. The estimated right ventricular systolic pressure is 03.4 mmHg.   3. Left atrial size was mildly dilated.   4. The mitral valve is normal in structure. Mild to moderate mitral valve  regurgitation. No evidence of mitral stenosis.   5. The aortic valve has an indeterminant number of cusps. Aortic valve  regurgitation is not visualized. No aortic stenosis is present.   6. The inferior vena cava is normal in size with <50% respiratory  variability, suggesting right atrial pressure of 8 mmHg. __________  2D echo 05/14/2020 [facility]): Summary    1. The left ventricle is normal in size with mildly increased wall  thickness.    2. The left ventricular systolic function is normal, LVEF is visually  estimated at > 55%.    3. Mitral annular calcification is present (mild).    4. There is mild mitral valve regurgitation.    5. The left atrium is mildly dilated in size.    6. The right ventricle is normal in size, with normal systolic function.  __________  2D echo 06/11/2018 [facility]):  Technically difficult study due to chest wall/lung interference   Normal left ventricular systolic function, ejection fraction > 74%   Diastolic dysfunction - grade II (elevated filling pressures)   Dilated left atrium   Normal right ventricular systolic function __________  Outpatient cardiac monitor 12/2014 [facility]): Patient had a min HR of 44 bpm, max HR of 169 bpm, and avg HR of  65 bpm.   Predominant underlying rhythm was Sinus Rhythm.    1 run of Ventricular Tachycardia occurred lasting 8 beats with a  max rate of 169 bpm (avg 141 bpm).   Atrial Fibrillation occurred (9% burden), ranging from 62-152 bpm  (avg of 106 bpm). Atrial Fibrillation was detected within 45  seconds of the patient pressing the trigger button.   Isolated SVEs were occasional (2.0%, [id number]), SVE Couplets were  rare (<1.0%, 1127), and SVE Triplets were rare (<1.0%, 38).   Isolated VEs were rare (<1.0%, 4350), VE Couplets were rare  (<1.0%, 30), and VE Triplets were rare (<1.0%, 2). Ventricular  Trigeminy was present.   Patient triggered events correlated to atrial fibrillation. There  were no diary entries.  __________  Nuclear stress test 06/27/2013 [facility]): Impressions:  - Low risk study  - No significant ischemia is noted on perfusion imaging  - There is a small in size, mild in severity, fixed defect involving the  apical segment. This is consistent with possible attenuation artifact and  cannot rule out subtle scar.  - Post stress: Global systolic function is hyperdynamic. The ejection  fraction was greater than 65%.  - Mitral annular calcification is noted  - Mild coronary calcifications are noted  - Incidentally noted on the attenuation CT scan shows multiple cysts in  the liver with largest is 5 x 4 cm (incompletely assessed) in size.  Clinical and / or ultrasound correlation recommended   Laboratory Data:  Chemistry Recent Labs  Lab 04/27/22 1805 04/29/22 0625  NA 137 142  K 3.3* 3.1*  CL 106 110  CO2 24 26  GLUCOSE 115* 116*  BUN 12 13  CREATININE 0.80 0.74  CALCIUM 9.1 9.0  GFRNONAA >60 >60  ANIONGAP 7 6    No results for input(s): "PROT", "ALBUMIN", "AST", "ALT", "ALKPHOS", "BILITOT" in the last 168 hours. Hematology Recent Labs  Lab 04/27/22 1805 04/29/22 0625  WBC 7.6 6.3  RBC 4.53 4.33  HGB 13.8 13.3  HCT  42.2 40.4  MCV 93.2 93.3  MCH 30.5 30.7  MCHC 32.7 32.9  RDW 13.0 13.0  PLT 236 197   Cardiac  EnzymesNo results for input(s): "TROPONINI" in the last 168 hours. No results for input(s): "TROPIPOC" in the last 168 hours.  BNP Recent Labs  Lab 04/27/22 1805  BNP 574.4*    DDimer No results for input(s): "DDIMER" in the last 168 hours.  Radiology/Studies:  DG Chest 2 View  Result Date: 04/27/2022 IMPRESSION: 1. Moderate bilateral pleural effusions and associated atelectasis or consolidation. 2. Diffuse bilateral interstitial pulmonary opacity, likely edema. 3. Cardiomegaly. Electronically Signed   By: Delanna Ahmadi M.D.   On: 04/27/2022 18:34    Assessment and Plan:   1.  Persistent A-fib: -Long history of A-fib that dates back at least to 2014 with increasing burden leading outside cardiology group to transition her from sotalol to amiodarone just several days prior -Suspect she will feel better and have less recurrence of volume overload in sinus rhythm -Agree with outside cardiology group to transition to amiodarone with continued typical 10 g load along with continuation of Toprol-XL -Should she remain in A-fib there after would recommend DCCV -CHA2DS2-VASc at least 4 (HTN, age x 2, sex category) -Continue apixaban 5 mg twice daily (she does not meet reduced dosing criteria  2.  Pleural effusions with likely HFpEF: -In the setting of persistent A-fib with RVR -Agree with IV diuresis with close monitoring of renal function, ins, and outs -Plan to restore sinus rhythm following amiodarone loading as outlined above -If she remains in A-fib at discharge, will likely need a low-dose diuretic  3.  Sinus node dysfunction: -Status post PPM in 06/2018 -She plans to transition her care to our group, she will need to establish with EP -Device appears to be functioning normally  4. HTN: -Blood pressure well controlled in the hospital, she reports some episodes of soft BP in the 67H systolic -Given this, and in the context of some lower extremity swelling, we will hold amlodipine -If  needed for blood pressure support moving forward we could titrate metoprolol        For questions or updates, please contact [place] Please consult [email or web address] for contact info under Cardiology/STEMI.   Signed, Christell Faith, PA-C [place] Pager: [phone] 04/29/2022, 2:20 PM

## 2022-04-29 NOTE — Assessment & Plan Note (Addendum)
Replete and recheck.  Likely due to diuresis from Lasix may need scheduled potassium

## 2022-04-29 NOTE — Assessment & Plan Note (Signed)
Continue Xanax which was restarted this admission.  She has taken Xanax, paroxetine, venlafaxine and Celexa in the past, trazodone added for insomnia

## 2022-04-29 NOTE — Consult Note (Signed)
Consultation Note Date: 04/29/2022   Patient Name: Daisy Parks  DOB: 10/23/1931  MRN: [id number]  Age / Sex: 88 y.o., female  PCP: Daisy Never, MD Referring Physician: Max Sane, MD  Reason for Consultation: Establishing goals of care  HPI/Patient Profile:Daisy Parks is a 88 y.o. female seen in ed with complaints of Leg swelling for past few days. Says she does not eat salt and has been following healthy recipes. Pt does not report any headaches blurred vision chest pain shortness of breath palpitations fevers chills nausea vomiting abdominal pain or any falls.   Pt has past medical history of paroxysmal atrial fibrillation, hypertension, heart disease, syncope and collapse.  Clinical Assessment and Goals of Care: Notes and labs reviewed.  Patient is sitting in bedside chair at this time.  She is widowed with 2/3 children living.  She lives alone.  She is clear that she would want her daughter Daisy Parks to be her surrogate Media planner.   She states she uses a cane at home.  She has a walker but does not use it.  She states that she is able to fix some meals and do some household chores but it takes a while to do them.  She discusses weakness, shortness of breath, restlessness, and swelling.  Patient discusses her diagnosis of atrial fibrillation in 2019 with pacemaker placement.  She states she was doing well until a few months ago where she returned to A-fib and has had symptoms.  We discussed her diagnosis, prognosis, GOC, EOL wishes disposition and options.  Created space and opportunity for patient  to explore thoughts and feelings regarding current medical information.   A detailed discussion was had today regarding advanced directives.  Concepts specific to code status, artifical feeding and hydration, IV antibiotics and rehospitalization were discussed.  The difference between an aggressive  medical intervention path and a comfort care path was discussed.  Values and goals of care important to patient and family were attempted to be elicited.  Discussed limitations of medical interventions to prolong quality of life in some situations and discussed the concept of human mortality.  She states that independence and quality of life are most important to her.  She states that if needed, she would want to be placed on life support for up to a week, and only if there was a correctable issue.  She tells me that she would not want CPR or resuscitation if her pulse is absent and her breathing has stopped as this would likely break her ribs, and would not provide a quality of life that she would be okay with.  She states that she would Parks want a feeding tube, and is unsure of her feelings on things like dialysis if it were ever needed, but states that she will address those issues as they occur.  She states that if she is too sick and not of sound mind to make decisions regarding her care she would just want to be comfort focused care.  She would  like HPOA forms completed and notarized with chaplain for her daughter Daisy Parks to be her surrogate decision maker.  She would like to be made a DNR and completed a MOST form.  I completed a MOST form today and the signed original was placed in the chart. A photocopy was placed in the chart to be scanned into EMR and a copy faxed to medical records. The patient outlined their wishes for the following treatment decisions:  Cardiopulmonary Resuscitation: Do Not Attempt Resuscitation (DNR/No CPR)  Medical Interventions: Full Scope of Treatment: Use intubation, advanced airway interventions, mechanical ventilation, cardioversion as indicated, medical treatment, IV fluids, etc, also provide comfort measures. Transfer to the hospital if indicated ventilator up to 1 week only  Antibiotics: Antibiotics if indicated  IV Fluids: IV fluids if indicated  Feeding Tube:  No feeding tube     SUMMARY OF RECOMMENDATIONS   DNR-no resuscitation if pulse is absent and breathing stops Ventilator support for up to 1 week for correctable cause.  Spiritual care consulted for completion of HPOA forms.  Recommend outpatient palliative     Primary Diagnoses: Present on Admission:  Essential hypertension  Atrial fibrillation (HCC)  Bilateral lower extremity edema  Anxiety state  Bilateral leg edema  Hypokalemia   I have reviewed the medical record, interviewed the patient and family, and examined the patient. The following aspects are pertinent.  Past Medical History:  Diagnosis Date   Atrial fibrillation ([place])    Breast cancer ([place]) 1192   LUMPECTOMY   Cancer ([place])    Coronary artery disease    Hyperlipidemia    Hypertension    Syncope and collapse    Social History   Socioeconomic History   Marital status: Married    Spouse name: Not on file   Number of children: Not on file   Years of education: Not on file   Highest education level: Not on file  Occupational History   Not on file  Tobacco Use   Smoking status: Parks   Smokeless tobacco: Parks  Substance and Sexual Activity   Alcohol use: No    Alcohol/week: 0.0 standard drinks of alcohol   Drug use: No   Sexual activity: Not Currently  Other Topics Concern   Not on file  Social History Narrative   Not on file   Social Determinants of Health   Financial Resource Strain: Not on file  Food Insecurity: Not on file  Transportation Needs: Not on file  Physical Activity: Not on file  Stress: Not on file  Social Connections: Not on file   Family History  Problem Relation Age of Onset   CVA Mother    CAD Father    CVA Father    Heart attack Sister    Hypertension Brother    Scheduled Meds:  ALPRAZolam  0.25 mg Oral TID   amiodarone  200 mg Oral BID   amLODipine  5 mg Oral Daily   apixaban  5 mg Oral BID   furosemide  40 mg Intravenous Daily   metoprolol succinate  25 mg  Oral BID   potassium chloride  40 mEq Oral Once   traZODone  25 mg Oral QHS   Continuous Infusions: PRN Meds:.acetaminophen **OR** acetaminophen, ondansetron **OR** ondansetron (ZOFRAN) IV Medications Prior to Admission:  Prior to Admission medications   Medication Sig Start Date End Date Taking? Authorizing Provider  amiodarone (PACERONE) 200 MG tablet Take 200 mg by mouth 2 (two) times daily. 04/17/22  Yes [provider]  amLODipine (NORVASC) 5 MG tablet Take 5 mg by mouth daily.   Yes [provider]  apixaban (ELIQUIS) 5 MG TABS tablet Take 5 mg by mouth 2 (two) times daily. 04/22/22  Yes [provider]  furosemide (LASIX) 20 MG tablet Take 20 mg by mouth every other day. 04/17/22  Yes [provider]  metoprolol succinate (TOPROL-XL) 25 MG 24 hr tablet Take 25 mg by mouth 2 (two) times daily. 02/13/22  Yes [provider]  mirtazapine (REMERON) 15 MG tablet Take 15 mg by mouth at bedtime. Patient not taking: Reported on 04/27/2022 02/21/22   [provider]  sotalol (BETAPACE) 160 MG tablet Take 160 mg by mouth 2 (two) times daily. Patient not taking: Reported on 04/27/2022 02/13/22   [provider]  warfarin (COUMADIN) 2.5 MG tablet 2.5 mg on Thursday and 5 mg all other days Patient not taking: Reported on 04/27/2022 04/03/22   Emeterio Reeve, DO   Allergies  Allergen Reactions   Diltiazem     Other reaction(s): Dizziness dizzy dizzy    Hydrochlorothiazide     Other reaction(s): Dizziness   Chlorthalidone Other (See Comments)    Other reaction(s): Other (See Comments) Exacerbates afib Exacerbates afib    Pravastatin     Other reaction(s): Other (See Comments), Other (See Comments) Other reaction(s): MUSCLE PAIN Muscle pain  Other reaction(s): MUSCLE PAIN Muscle pain     Venlafaxine     Other reaction(s): Other (See Comments) Light headed Light headed    Doxazosin Palpitations    and Hypotension and  Hypotension    Enalapril Itching    Other reaction(s): Other (See Comments), Other (See Comments) Cough  Cough     Losartan Palpitations   Mometasone Rash   Review of Systems  Constitutional:  Positive for activity change and fatigue.  Respiratory:  Positive for shortness of breath.   Cardiovascular:  Positive for leg swelling.    Physical Exam Pulmonary:     Effort: Pulmonary effort is normal.  Neurological:     Mental Status: She is alert.     Vital Signs: BP 115/77 (BP Location: Right Arm)   Pulse 83   Temp 97.9 F (36.6 C)   Resp 16   Ht '5\' 5"'$  (1.651 m)   Wt 73.7 kg   SpO2 (!) 86%   BMI 27.04 kg/m  Pain Scale: 0-10   Pain Score: 0-No pain   SpO2: SpO2: (!) 86 % O2 Device:SpO2: (!) 86 % O2 Flow Rate: .O2 Flow Rate (L/min): 2 L/min  IO: Intake/output summary:  Intake/Output Summary (Last 24 hours) at 04/29/2022 1253 Last data filed at 04/29/2022 1051 Gross per 24 hour  Intake 600 ml  Output 700 ml  Net -100 ml    LBM: Last BM Date : 04/28/22 Baseline Weight: Weight: 68.9 kg Most recent weight: Weight: 73.7 kg     ment and plan.  Signed by: Asencion Gowda, NP   Please contact Palliative Medicine Team phone at [phone] for questions and concerns.  For individual provider: See Shea Evans

## 2022-04-29 NOTE — Assessment & Plan Note (Signed)
Recent d/c 2 weeks ago. Most recent echo shows elevated PA pressure and may be diastolic dysfunction. Continue Lasix to 40 mg IV daily for now

## 2022-04-30 DIAGNOSIS — R600 Localized edema: Secondary | ICD-10-CM | POA: Diagnosis not present

## 2022-04-30 LAB — BASIC METABOLIC PANEL
Anion gap: 3 — ABNORMAL LOW (ref 5–15)
BUN: 17 mg/dL (ref 8–23)
CO2: 28 mmol/L (ref 22–32)
Calcium: 9.1 mg/dL (ref 8.9–10.3)
Chloride: 109 mmol/L (ref 98–111)
Creatinine, Ser: 0.75 mg/dL (ref 0.44–1.00)
GFR, Estimated: >60 mL/min (ref >60)
Glucose, Bld: 112 mg/dL — ABNORMAL HIGH (ref 70–99)
Potassium: 3.6 mmol/L (ref 3.5–5.1)
Sodium: 140 mmol/L (ref 135–145)

## 2022-04-30 LAB — CBC
HCT: 40.2 % (ref 36.0–46.0)
Hemoglobin: 13.2 g/dL (ref 12.0–15.0)
MCH: 30.7 pg (ref 26.0–34.0)
MCHC: 32.8 g/dL (ref 30.0–36.0)
MCV: 93.5 fL (ref 80.0–100.0)
Platelets: 192 10*3/uL (ref 150–400)
RBC: 4.3 MIL/uL (ref 3.87–5.11)
RDW: 12.9 % (ref 11.5–15.5)
WBC: 6.4 10*3/uL (ref 4.0–10.5)
nRBC: 0 % (ref 0.0–0.2)

## 2022-04-30 MED ORDER — FUROSEMIDE 20 MG PO TABS: 20.0000 mg | ORAL_TABLET | Freq: Every day | ORAL | Status: DC

## 2022-04-30 MED ORDER — AMIODARONE HCL 200 MG PO TABS: 200.0000 mg | ORAL_TABLET | Freq: Two times a day (BID) | ORAL | 0 refills | Status: DC

## 2022-04-30 MED ORDER — POTASSIUM CHLORIDE CRYS ER 20 MEQ PO TBCR
40.0000 meq | EXTENDED_RELEASE_TABLET | Freq: Once | ORAL | Status: AC
  Administered 2022-04-30: 40 meq via ORAL
  Filled 2022-04-30: qty 2

## 2022-04-30 MED ORDER — POLYETHYLENE GLYCOL 3350 17 G PO PACK
17.0000 g | PACK | Freq: Every day | ORAL | Status: DC
  Administered 2022-04-30: 17 g via ORAL
  Filled 2022-04-30: qty 1

## 2022-04-30 MED ORDER — FUROSEMIDE 20 MG PO TABS: 20.0000 mg | ORAL_TABLET | Freq: Every day | ORAL | 0 refills | Status: DC

## 2022-04-30 NOTE — Progress Notes (Signed)
Mobility Specialist - Progress Note   04/30/22 1100  Mobility  Activity Ambulated with assistance in hallway  Level of Assistance Standby assist, set-up cues, supervision of patient - no hands on  Assistive Device  (Walking stick)  Distance Ambulated (ft) 300 ft  Activity Response Tolerated well  $Mobility charge 1 Mobility     Pt seen ambulating in hallway with family using walking stick on 2L. Author intercepts during standing rest break and O2 is 98% HR 114 BPM. Pt shows no signs of distress and ambulates back to room with family present.  Merrily Brittle Mobility Specialist 04/30/22, 11:06 AM

## 2022-04-30 NOTE — Discharge Summary (Signed)
Physician Discharge Summary   Patient: Daisy Parks MRN: [id number] DOB: [birth year]  Admit date:     04/27/2022  Discharge date: 04/30/22  Discharge Physician: Lorella Nimrod   PCP: Caryl Never, MD   Recommendations at discharge:  Please obtain BMP within a week Follow-up with cardiology in 1 to 2 weeks Follow-up in A-fib clinic Follow-up with primary care provider  Discharge Diagnoses: Principal Problem:   Bilateral lower extremity edema Active Problems:   Essential hypertension   Atrial fibrillation (HCC)   Anxiety state   Bilateral leg edema   Hypokalemia   Hospital Course: 88 year old female with history of paroxysmal atrial fibrillation on warfarin, sotalol, metoprolol, hypertension, anxiety, hyperlipidemia admitted for shortness of breath  8/7: Patient and her daughter reports insomnia and anxiety contributing to her symptoms.  Added Xanax and increased Lasix to 40 mg IV daily.  Palliative care consult 8/8: Cardiology consult   8/9: Patient appears euvolemic and wants to go home.  Labs seems stable. Heart rate well-controlled on amiodarone and Eliquis. Per cardiology will need cardioversion in 2 to 3 weeks which can be done as an outpatient.  Cardiology made her home Lasix daily instead of every other day. She will continue on amiodarone 200 mg twice daily until 05/06/2022 followed by daily.  She will continue the rest of her home medications and need to have a close follow-up with her cardiologist for further recommendations.  Assessment and Plan: * Bilateral lower extremity edema Recent d/c 2 weeks ago. Most recent echo shows elevated PA pressure and may be diastolic dysfunction. Continue Lasix to 40 mg IV daily for now    Hypokalemia Replete and recheck.  Likely due to diuresis from Lasix may need scheduled potassium  Anxiety state Continue Xanax which was restarted this admission.  She has taken Xanax, paroxetine, venlafaxine and Celexa in the past,  trazodone added for insomnia    Atrial fibrillation ([place]) Rate controlled with amiodarone and Toprol eliquis for anticoagulation Cardiology consult.  Patient interested in switching her cardiologist from [facility] to someone locally    Essential hypertension Continue metoprolol, amiodarone Cardiology consult   Consultants: Cardiology Procedures performed: None Disposition: Home Diet recommendation:  Discharge Diet Orders (From admission, onward)     Start     Ordered   04/30/22 0000  Diet - low sodium heart healthy        04/30/22 1352           Cardiac diet DISCHARGE MEDICATION: Allergies as of 04/30/2022       Reactions   Diltiazem    Other reaction(s): Dizziness dizzy dizzy   Hydrochlorothiazide    Other reaction(s): Dizziness   Chlorthalidone Other (See Comments)   Other reaction(s): Other (See Comments) Exacerbates afib Exacerbates afib   Pravastatin    Other reaction(s): Other (See Comments), Other (See Comments) Other reaction(s): MUSCLE PAIN Muscle pain  Other reaction(s): MUSCLE PAIN Muscle pain    Venlafaxine    Other reaction(s): Other (See Comments) Light headed Light headed   Doxazosin Palpitations   and Hypotension and Hypotension   Enalapril Itching   Other reaction(s): Other (See Comments), Other (See Comments) Cough  Cough    Losartan Palpitations   Mometasone Rash        Medication List     STOP taking these medications    amLODipine 5 MG tablet Commonly known as: NORVASC   sotalol 160 MG tablet Commonly known as: BETAPACE   warfarin 2.5 MG tablet Commonly known as: COUMADIN  TAKE these medications    amiodarone 200 MG tablet Commonly known as: PACERONE Take 1 tablet (200 mg total) by mouth 2 (two) times daily. Continue until 8/15 and then start taking once daily. What changed: additional instructions   apixaban 5 MG Tabs tablet Commonly known as: ELIQUIS Take 5 mg by mouth 2 (two) times daily.   furosemide  20 MG tablet Commonly known as: LASIX Take 1 tablet (20 mg total) by mouth daily. What changed: when to take this   metoprolol succinate 25 MG 24 hr tablet Commonly known as: TOPROL-XL Take 25 mg by mouth 2 (two) times daily.   mirtazapine 15 MG tablet Commonly known as: REMERON Take 15 mg by mouth at bedtime.        Follow-up Information     Gladman, Okey Dupre, MD. Schedule an appointment as soon as possible for a visit in 1 week(s).   Specialty: Internal Medicine Contact information: [address] [place] [address] [phone]         Minna Merritts, MD. Schedule an appointment as soon as possible for a visit in 1 week(s).   Specialty: Cardiology Contact information: [place] [address] [phone]                Discharge Exam: Danley Danker Weights   04/27/22 1800 04/28/22 0500  Weight: 68.9 kg 73.7 kg   General.     In no acute distress. Pulmonary.  Lungs clear bilaterally, normal respiratory effort. CV.  Regular rate and rhythm, no JVD, rub or murmur. Abdomen.  Soft, nontender, nondistended, BS positive. CNS.  Alert and oriented .  No focal neurologic deficit. Extremities.  No edema, no cyanosis, pulses intact and symmetrical. Psychiatry.  Judgment and insight appears normal.   Condition at discharge: stable  The results of significant diagnostics from this hospitalization (including imaging, microbiology, ancillary and laboratory) are listed below for reference.   Imaging Studies: DG Chest 2 View  Result Date: 04/27/2022 CLINICAL DATA:  Shortness of breath EXAM: CHEST - 2 VIEW COMPARISON:  04/02/2022 FINDINGS: Cardiomegaly with right chest multi lead pacer. Moderate bilateral pleural effusions and associated atelectasis or consolidation. Diffuse bilateral interstitial pulmonary opacity. Disc degenerative disease of the thoracic spine. IMPRESSION: 1. Moderate bilateral pleural effusions and associated  atelectasis or consolidation. 2. Diffuse bilateral interstitial pulmonary opacity, likely edema. 3. Cardiomegaly. Electronically Signed   By: Delanna Ahmadi M.D.   On: 04/27/2022 18:34   ECHOCARDIOGRAM COMPLETE  Result Date: 04/03/2022    ECHOCARDIOGRAM REPORT   Patient Name:   Daisy Parks Date of Exam: 04/03/2022 Medical Rec #:  [id number]    Height:       65.0 in Accession #:    [id number]   Weight:       149.9 lb Date of Birth:  1932-01-31    BSA:          1.750 m Patient Age:    89 years     BP:           115/48 mmHg Patient Gender: F            HR:           80 bpm. Exam Location:  [facility] Procedure: 2D Echo, Cardiac Doppler and Color Doppler Indications:     Dyspnea R06.00  History:         Patient has no prior history of Echocardiogram examinations.  CAD, Arrythmias:Atrial Fibrillation; Risk Factors:Hypertension.  Sonographer:     Sherrie Sport Referring Phys:  [id number] AMY N COX Diagnosing Phys: Ida Rogue MD IMPRESSIONS  1. Left ventricular ejection fraction, by estimation, is 50 to 55%. The left ventricle has low normal function. The left ventricle has no regional wall motion abnormalities. There is mild left ventricular hypertrophy. Left ventricular diastolic parameters are indeterminate.  2. Right ventricular systolic function is normal. The right ventricular size is normal. There is mildly elevated pulmonary artery systolic pressure. The estimated right ventricular systolic pressure is 54.2 mmHg.  3. Left atrial size was mildly dilated.  4. The mitral valve is normal in structure. Mild to moderate mitral valve regurgitation. No evidence of mitral stenosis.  5. The aortic valve has an indeterminant number of cusps. Aortic valve regurgitation is not visualized. No aortic stenosis is present.  6. The inferior vena cava is normal in size with <50% respiratory variability, suggesting right atrial pressure of 8 mmHg. FINDINGS  Left Ventricle: Left ventricular ejection fraction, by estimation,  is 50 to 55%. The left ventricle has low normal function. The left ventricle has no regional wall motion abnormalities. The left ventricular internal cavity size was normal in size. There is mild left ventricular hypertrophy. Left ventricular diastolic parameters are indeterminate. Right Ventricle: The right ventricular size is normal. No increase in right ventricular wall thickness. Right ventricular systolic function is normal. There is mildly elevated pulmonary artery systolic pressure. The tricuspid regurgitant velocity is 2.89  m/s, and with an assumed right atrial pressure of 5 mmHg, the estimated right ventricular systolic pressure is 70.6 mmHg. Left Atrium: Left atrial size was mildly dilated. Right Atrium: Right atrial size was normal in size. Pericardium: There is no evidence of pericardial effusion. Mitral Valve: The mitral valve is normal in structure. Mild mitral annular calcification. Mild to moderate mitral valve regurgitation. No evidence of mitral valve stenosis. Tricuspid Valve: The tricuspid valve is normal in structure. Tricuspid valve regurgitation is not demonstrated. No evidence of tricuspid stenosis. Aortic Valve: The aortic valve has an indeterminant number of cusps. Aortic valve regurgitation is not visualized. No aortic stenosis is present. Aortic valve mean gradient measures 2.0 mmHg. Aortic valve peak gradient measures 3.4 mmHg. Aortic valve area, by VTI measures 1.85 cm. Pulmonic Valve: The pulmonic valve was normal in structure. Pulmonic valve regurgitation is not visualized. No evidence of pulmonic stenosis. Aorta: The aortic root is normal in size and structure. Venous: The inferior vena cava is normal in size with less than 50% respiratory variability, suggesting right atrial pressure of 8 mmHg. IAS/Shunts: No atrial level shunt detected by color flow Doppler. Additional Comments: A device lead is visualized.  LEFT VENTRICLE PLAX 2D LVIDd:         5.10 cm   Diastology LVIDs:          3.60 cm   LV e' medial:    6.85 cm/s LV PW:         1.30 cm   LV E/e' medial:  16.6 LV IVS:        1.30 cm   LV e' lateral:   6.96 cm/s LVOT diam:     2.00 cm   LV E/e' lateral: 16.4 LV SV:         44 LV SV Index:   25 LVOT Area:     3.14 cm  RIGHT VENTRICLE RV Basal diam:  3.70 cm RV S prime:     7.94 cm/s TAPSE (M-mode): 2.4  cm LEFT ATRIUM             Index        RIGHT ATRIUM           Index LA diam:        4.30 cm 2.46 cm/m   RA Area:     16.60 cm LA Vol (A2C):   95.3 ml 54.46 ml/m  RA Volume:   45.20 ml  25.83 ml/m LA Vol (A4C):   68.1 ml 38.91 ml/m LA Biplane Vol: 81.1 ml 46.34 ml/m  AORTIC VALVE AV Area (Vmax):    1.81 cm AV Area (Vmean):   1.90 cm AV Area (VTI):     1.85 cm AV Vmax:           92.80 cm/s AV Vmean:          64.300 cm/s AV VTI:            0.238 m AV Peak Grad:      3.4 mmHg AV Mean Grad:      2.0 mmHg LVOT Vmax:         53.50 cm/s LVOT Vmean:        38.800 cm/s LVOT VTI:          0.140 m LVOT/AV VTI ratio: 0.59  AORTA Ao Root diam: 2.90 cm MITRAL VALVE                TRICUSPID VALVE MV Area (PHT): 3.91 cm     TR Peak grad:   33.4 mmHg MV Decel Time: 194 msec     TR Vmax:        289.00 cm/s MV E velocity: 114.00 cm/s MV A velocity: 58.30 cm/s   SHUNTS MV E/A ratio:  1.96         Systemic VTI:  0.14 m                             Systemic Diam: 2.00 cm Ida Rogue MD Electronically signed by Ida Rogue MD Signature Date/Time: 04/03/2022/3:02:43 PM    Final    DG Chest Portable 1 View  Result Date: 04/02/2022 CLINICAL DATA:  SOB EXAM: PORTABLE CHEST 1 VIEW COMPARISON:  None Available. FINDINGS: Small left pleural effusion with overlying left basilar opacities. Mild diffuse interstitial opacities. No visible pneumothorax. Similar cardiomediastinal silhouette. Similar position of a right subclavian approach cardiac rhythm maintenance device. Left axillary and/or chest wall clips. IMPRESSION: 1. Small left pleural effusion with overlying left basilar consolidation and/or  atelectasis. 2. Mild diffuse interstitial opacities, suspicious for mild interstitial edema. Electronically Signed   By: Margaretha Sheffield M.D.   On: 04/02/2022 11:00    Microbiology: Results for orders placed or performed during the hospital encounter of 04/02/22  Urine Culture     Status: Abnormal   Collection Time: 04/02/22  2:55 PM   Specimen: Urine, Clean Catch  Result Value Ref Range Status   Specimen Description   Final    URINE, CLEAN CATCH Performed at [facility], [address] [place], [place] [address]    Special Requests   Final    NONE Performed at [facility], [address] [place], [place] [address]    Culture MULTIPLE SPECIES PRESENT, SUGGEST RECOLLECTION (A)  Final   Report Status 04/03/2022 FINAL  Final  SARS Coronavirus 2 by RT PCR (hospital order, performed in [facility] hospital lab) *cepheid single result test* Anterior Nasal  Swab     Status: None   Collection Time: 04/02/22 11:29 PM   Specimen: Anterior Nasal Swab  Result Value Ref Range Status   SARS Coronavirus 2 by RT PCR NEGATIVE NEGATIVE Final    Comment: (NOTE) SARS-CoV-2 target nucleic acids are NOT DETECTED.  The SARS-CoV-2 RNA is generally detectable in upper and lower respiratory specimens during the acute phase of infection. The lowest concentration of SARS-CoV-2 viral copies this assay can detect is 250 copies / mL. A negative result does not preclude SARS-CoV-2 infection and should not be used as the sole basis for treatment or other patient management decisions.  A negative result may occur with improper specimen collection / handling, submission of specimen other than nasopharyngeal swab, presence of viral mutation(s) within the areas targeted by this assay, and inadequate number of viral copies (<250 copies / mL). A negative result must be combined with clinical observations, patient history, and epidemiological information.  Fact Sheet for Patients:    https://www.patel.info/  Fact Sheet for Healthcare Providers: https://hall.com/  This test is not yet approved or  cleared by the [place] FDA and has been authorized for detection and/or diagnosis of SARS-CoV-2 by FDA under an Emergency Use Authorization (EUA).  This EUA will remain in effect (meaning this test can be used) for the duration of the COVID-19 declaration under Section 564(b)(1) of the Act, 21 U.S.C. section 360bbb-3(b)(1), unless the authorization is terminated or revoked sooner.  Performed at [facility], [address] [place], [place] [address]     Labs: CBC: Recent Labs  Lab 04/27/22 1805 04/29/22 0625 04/30/22 0559  WBC 7.6 6.3 6.4  HGB 13.8 13.3 13.2  HCT 42.2 40.4 40.2  MCV 93.2 93.3 93.5  PLT 236 197 637   Basic Metabolic Panel: Recent Labs  Lab 04/27/22 1805 04/29/22 0625 04/30/22 0559  NA 137 142 140  K 3.3* 3.1* 3.6  CL 106 110 109  CO2 '24 26 28  '$ GLUCOSE 115* 116* 112*  BUN '12 13 17  '$ CREATININE 0.80 0.74 0.75  CALCIUM 9.1 9.0 9.1   Liver Function Tests: No results for input(s): "AST", "ALT", "ALKPHOS", "BILITOT", "PROT", "ALBUMIN" in the last 168 hours. CBG: No results for input(s): "GLUCAP" in the last 168 hours.  Discharge time spent: greater than 30 minutes.  This record has been created using Systems analyst. Errors have been sought and corrected,but may not always be located. Such creation errors do not reflect on the standard of care.   Signed: Lorella Nimrod, MD [facility] 04/30/2022

## 2022-04-30 NOTE — Progress Notes (Signed)
   04/30/22 1400  Clinical Encounter Type  Visited With Patient and family together  Visit Type Initial  Referral From Nurse  Consult/Referral To Chaplain   Chaplain responded to nurse consult. Chaplain provided compassionate presence and reflective listening as patient and family spoke about hospital stay. Chaplain also left AD paperwork. Patient and family appreciated Chaplain visit.

## 2022-04-30 NOTE — Progress Notes (Signed)
Progress Note  Patient Name: Daisy Parks Date of Encounter: 04/30/2022  Primary Cardiologist: Previously [facility], request to transition to [facility]   No chest pain. Dyspnea improved. Sleepy this morning.   Inpatient Medications    Scheduled Meds:  ALPRAZolam  0.25 mg Oral TID   amiodarone  200 mg Oral BID   apixaban  5 mg Oral BID   furosemide  40 mg Intravenous Daily   metoprolol succinate  25 mg Oral BID   potassium chloride  40 mEq Oral Once   traZODone  25 mg Oral QHS   Continuous Infusions:  PRN Meds: acetaminophen **OR** acetaminophen, ondansetron **OR** ondansetron (ZOFRAN) IV   Vital Signs    Vitals:   04/29/22 2011 04/29/22 2301 04/30/22 0312 04/30/22 0749  BP: 108/80 (!) 103/45 115/72 98/66  Pulse: 61 61 67 67  Resp: '20 20 18 17  '$ Temp: (!) 97.5 F (36.4 C) 97.6 F (36.4 C) 97.6 F (36.4 C) 97.8 F (36.6 C)  TempSrc: Oral     SpO2: 94% 98% 95% 99%  Weight:      Height:        Intake/Output Summary (Last 24 hours) at 04/30/2022 1134 Last data filed at 04/30/2022 1030 Gross per 24 hour  Intake 660 ml  Output --  Net 660 ml   Filed Weights   04/27/22 1800 04/28/22 0500  Weight: 68.9 kg 73.7 kg    Telemetry    Afib, 70s to 90s bpm - Personally Reviewed  ECG    No new tracings - Personally Reviewed  Physical Exam   GEN: No acute distress.   Neck: No JVD. Cardiac: IRIR, no murmurs, rubs, or gallops.  Respiratory: Clear to auscultation bilaterally.  GI: Soft, nontender, non-distended.   MS: No edema; No deformity. Neuro:  Alert and oriented x 3; Nonfocal.  Psych: Normal affect.  Labs    Chemistry Recent Labs  Lab 04/27/22 1805 04/29/22 0625 04/30/22 0559  NA 137 142 140  K 3.3* 3.1* 3.6  CL 106 110 109  CO2 '24 26 28  '$ GLUCOSE 115* 116* 112*  BUN '12 13 17  '$ CREATININE 0.80 0.74 0.75  CALCIUM 9.1 9.0 9.1  GFRNONAA >60 >60 >60  ANIONGAP 7 6 3*     Hematology Recent Labs  Lab 04/27/22 1805 04/29/22 0625  04/30/22 0559  WBC 7.6 6.3 6.4  RBC 4.53 4.33 4.30  HGB 13.8 13.3 13.2  HCT 42.2 40.4 40.2  MCV 93.2 93.3 93.5  MCH 30.5 30.7 30.7  MCHC 32.7 32.9 32.8  RDW 13.0 13.0 12.9  PLT 236 197 192    Cardiac EnzymesNo results for input(s): "TROPONINI" in the last 168 hours. No results for input(s): "TROPIPOC" in the last 168 hours.   BNP Recent Labs  Lab 04/27/22 1805  BNP 574.4*     DDimer No results for input(s): "DDIMER" in the last 168 hours.   Radiology    No results found.  Cardiac Studies   2D echo 04/03/2022: 1. Left ventricular ejection fraction, by estimation, is 50 to 55%. The  left ventricle has low normal function. The left ventricle has no regional  wall motion abnormalities. There is mild left ventricular hypertrophy.  Left ventricular diastolic  parameters are indeterminate.   2. Right ventricular systolic function is normal. The right ventricular  size is normal. There is mildly elevated pulmonary artery systolic  pressure. The estimated right ventricular systolic pressure is 26.7 mmHg.   3. Left atrial  size was mildly dilated.   4. The mitral valve is normal in structure. Mild to moderate mitral valve  regurgitation. No evidence of mitral stenosis.   5. The aortic valve has an indeterminant number of cusps. Aortic valve  regurgitation is not visualized. No aortic stenosis is present.   6. The inferior vena cava is normal in size with <50% respiratory  variability, suggesting right atrial pressure of 8 mmHg. __________   2D echo 05/14/2020 [facility]): Summary    1. The left ventricle is normal in size with mildly increased wall  thickness.    2. The left ventricular systolic function is normal, LVEF is visually  estimated at > 55%.    3. Mitral annular calcification is present (mild).    4. There is mild mitral valve regurgitation.    5. The left atrium is mildly dilated in size.    6. The right ventricle is normal in size, with normal systolic function.   __________   2D echo 06/11/2018 [facility]):  Technically difficult study due to chest wall/lung interference   Normal left ventricular systolic function, ejection fraction > 47%   Diastolic dysfunction - grade II (elevated filling pressures)   Dilated left atrium   Normal right ventricular systolic function __________   Outpatient cardiac monitor 12/2014 [facility]): Patient had a min HR of 44 bpm, max HR of 169 bpm, and avg HR of  65 bpm.   Predominant underlying rhythm was Sinus Rhythm.   1 run of Ventricular Tachycardia occurred lasting 8 beats with a  max rate of 169 bpm (avg 141 bpm).   Atrial Fibrillation occurred (9% burden), ranging from 62-152 bpm  (avg of 106 bpm). Atrial Fibrillation was detected within 45  seconds of the patient pressing the trigger button.   Isolated SVEs were occasional (2.0%, [id number]), SVE Couplets were  rare (<1.0%, 1127), and SVE Triplets were rare (<1.0%, 38).   Isolated VEs were rare (<1.0%, 4350), VE Couplets were rare  (<1.0%, 30), and VE Triplets were rare (<1.0%, 2). Ventricular  Trigeminy was present.   Patient triggered events correlated to atrial fibrillation. There  were no diary entries.  __________   Nuclear stress test 06/27/2013 [facility]): Impressions:  - Low risk study  - No significant ischemia is noted on perfusion imaging  - There is a small in size, mild in severity, fixed defect involving the  apical segment. This is consistent with possible attenuation artifact and  cannot rule out subtle scar.  - Post stress: Global systolic function is hyperdynamic. The ejection  fraction was greater than 65%.  - Mitral annular calcification is noted  - Mild coronary calcifications are noted  - Incidentally noted on the attenuation CT scan shows multiple cysts in  the liver with largest is 5 x 4 cm (incompletely assessed) in size.  Clinical and / or ultrasound correlation recommended   Patient Profile     88 y.o. female with history of  PAF complicated by posttermination pauses status post dual-chamber PPM in 06/2018 at [facility], breast cancer status postlumpectomy, HTN, and HLD, who is being seen today for the evaluation of A-fib and volume overload at the request of Dr. Manuella Ghazi.  Assessment & Plan    1. Persistent A-fib: -Long history of A-fib that dates back at least to 2014 with increasing burden leading outside cardiology group to transition her from sotalol to amiodarone just several days prior -Suspect she will feel better and have less recurrence of volume overload in sinus rhythm -Agree  with outside cardiology group to transition to amiodarone with continued typical 10 g load along with continuation of Toprol-XL -Should she remain in A-fib there after would recommend DCCV -CHA2DS2-VASc at least 4 (HTN, age x 2, sex category) -Continue apixaban 5 mg twice daily (she does not meet reduced dosing criteria   2.  Pleural effusions with likely HFpEF: -In the setting of persistent A-fib with RVR -BUN starting to trend up, transition to oral Lasix 20 mg daily on 8/10 (had recently started Lasix 20 mg every other day at [facility]) -Plan to restore sinus rhythm following amiodarone loading as outlined above   3.  Sinus node dysfunction: -Status post PPM in 06/2018 -She plans to transition her care to our group, she will need to establish with EP -Device appears to be functioning normally   4. HTN: -Blood pressure well controlled to soft in the hospital, she reports some episodes of soft BP in the 03T systolic -Given this, and in the context of some lower extremity swelling, amlodipine was stopped on 8/8  -If needed for blood pressure support moving forward we could titrate metoprolol      For questions or updates, please contact [place] Please consult [email or web address] for contact info under Cardiology/STEMI.    Signed, Christell Faith, PA-C [place] Pager: [phone] 04/30/2022, 11:34 AM

## 2022-05-01 ENCOUNTER — Telehealth: Payer: Self-pay | Admitting: Cardiovascular Disease

## 2022-05-01 NOTE — Telephone Encounter (Signed)
-----   Message from Wellington Hampshire, MD sent at 04/30/2022  4:54 PM EDT ----- This patient has an appointment with Thurmond Butts in September but she should be seen within 1 to 2 weeks.  She was discharged today.

## 2022-05-01 NOTE — Telephone Encounter (Signed)
Attempted to call to schedule sooner appointment, number available to lvm.

## 2022-05-02 ENCOUNTER — Telehealth: Payer: Self-pay | Admitting: Physician Assistant

## 2022-05-02 NOTE — Telephone Encounter (Signed)
Pt son called stating pt was supposed to be prescribed xanax after hospital visit with Christell Faith, PA, however it wasn't sent the pharmacy.

## 2022-05-02 NOTE — Telephone Encounter (Signed)
This is not something cardiology would address. It looks like the hospitalist recommended this be continued. Please forward to the hospitalist to address.

## 2022-05-02 NOTE — Telephone Encounter (Signed)
Spoke with patients son and advised that is not something we would prescribe. Reviewed that provider did review her chart and it appears the discharging physician was supposed to order this for her. Recommended that he call the floor she was staying while in the hospital to ask what he should do. Encouraged him to reach out to her primary care provider as they may be able to assist. He was appreciative for the call with no further questions at this time.

## 2022-05-05 NOTE — Telephone Encounter (Signed)
LMOV for sooner appt

## 2022-06-06 ENCOUNTER — Ambulatory Visit: Payer: Medicare HMO | Admitting: Physician Assistant

## 2022-08-05 ENCOUNTER — Observation Stay
Admission: EM | Admit: 2022-08-05 | Discharge: 2022-08-06 | Disposition: A | Discharge status: Home Health | Payer: Medicare HMO | Attending: Internal Medicine | Admitting: Internal Medicine

## 2022-08-05 ENCOUNTER — Emergency Department: Payer: Medicare HMO

## 2022-08-05 ENCOUNTER — Encounter: Payer: Self-pay | Admitting: *Deleted

## 2022-08-05 ENCOUNTER — Other Ambulatory Visit: Payer: Self-pay

## 2022-08-05 DIAGNOSIS — E876 Hypokalemia: Secondary | ICD-10-CM | POA: Insufficient documentation

## 2022-08-05 DIAGNOSIS — I4891 Unspecified atrial fibrillation: Secondary | ICD-10-CM | POA: Insufficient documentation

## 2022-08-05 DIAGNOSIS — I10 Essential (primary) hypertension: Secondary | ICD-10-CM | POA: Diagnosis not present

## 2022-08-05 DIAGNOSIS — R42 Dizziness and giddiness: Principal | ICD-10-CM | POA: Insufficient documentation

## 2022-08-05 DIAGNOSIS — R300 Dysuria: Secondary | ICD-10-CM | POA: Diagnosis not present

## 2022-08-05 DIAGNOSIS — N179 Acute kidney failure, unspecified: Secondary | ICD-10-CM | POA: Insufficient documentation

## 2022-08-05 DIAGNOSIS — Z950 Presence of cardiac pacemaker: Secondary | ICD-10-CM | POA: Insufficient documentation

## 2022-08-05 DIAGNOSIS — Z7901 Long term (current) use of anticoagulants: Secondary | ICD-10-CM | POA: Insufficient documentation

## 2022-08-05 DIAGNOSIS — R55 Syncope and collapse: Secondary | ICD-10-CM

## 2022-08-05 DIAGNOSIS — Z853 Personal history of malignant neoplasm of breast: Secondary | ICD-10-CM | POA: Diagnosis not present

## 2022-08-05 DIAGNOSIS — I951 Orthostatic hypotension: Secondary | ICD-10-CM | POA: Insufficient documentation

## 2022-08-05 DIAGNOSIS — I482 Chronic atrial fibrillation, unspecified: Secondary | ICD-10-CM | POA: Diagnosis present

## 2022-08-05 DIAGNOSIS — I959 Hypotension, unspecified: Secondary | ICD-10-CM | POA: Diagnosis not present

## 2022-08-05 LAB — TSH: TSH: 4.012 u[IU]/mL (ref 0.350–4.500)

## 2022-08-05 LAB — CBC
HCT: 47.2 % — ABNORMAL HIGH (ref 36.0–46.0)
Hemoglobin: 16.5 g/dL — ABNORMAL HIGH (ref 12.0–15.0)
MCH: 30.1 pg (ref 26.0–34.0)
MCHC: 35 g/dL (ref 30.0–36.0)
MCV: 86 fL (ref 80.0–100.0)
Platelets: 256 10*3/uL (ref 150–400)
RBC: 5.49 MIL/uL — ABNORMAL HIGH (ref 3.87–5.11)
RDW: 12.6 % (ref 11.5–15.5)
WBC: 10 10*3/uL (ref 4.0–10.5)
nRBC: 0 % (ref 0.0–0.2)

## 2022-08-05 LAB — BASIC METABOLIC PANEL
Anion gap: 7 (ref 5–15)
BUN: 17 mg/dL (ref 8–23)
CO2: 27 mmol/L (ref 22–32)
Calcium: 9.7 mg/dL (ref 8.9–10.3)
Chloride: 102 mmol/L (ref 98–111)
Creatinine, Ser: 1.25 mg/dL — ABNORMAL HIGH (ref 0.44–1.00)
GFR, Estimated: 41 mL/min — ABNORMAL LOW (ref >60)
Glucose, Bld: 136 mg/dL — ABNORMAL HIGH (ref 70–99)
Potassium: 3.2 mmol/L — ABNORMAL LOW (ref 3.5–5.1)
Sodium: 136 mmol/L (ref 135–145)

## 2022-08-05 LAB — URINALYSIS, ROUTINE W REFLEX MICROSCOPIC
Bilirubin Urine: NEGATIVE
Glucose, UA: NEGATIVE mg/dL
Hgb urine dipstick: NEGATIVE
Ketones, ur: NEGATIVE mg/dL
Nitrite: NEGATIVE
Protein, ur: NEGATIVE mg/dL
Specific Gravity, Urine: 1.005 (ref 1.005–1.030)
pH: 7 (ref 5.0–8.0)

## 2022-08-05 LAB — TROPONIN I (HIGH SENSITIVITY)
Troponin I (High Sensitivity): 16 ng/L (ref <18)
Troponin I (High Sensitivity): 15 ng/L (ref <18)

## 2022-08-05 LAB — T4, FREE: Free T4: 1.47 ng/dL — ABNORMAL HIGH (ref 0.61–1.12)

## 2022-08-05 LAB — BRAIN NATRIURETIC PEPTIDE: B Natriuretic Peptide: 168.5 pg/mL — ABNORMAL HIGH (ref 0.0–100.0)

## 2022-08-05 MED ORDER — METOPROLOL TARTRATE 25 MG PO TABS
12.5000 mg | ORAL_TABLET | Freq: Two times a day (BID) | ORAL | Status: DC
  Administered 2022-08-06: 12.5 mg via ORAL
  Filled 2022-08-05: qty 1

## 2022-08-05 MED ORDER — APIXABAN 5 MG PO TABS
5.0000 mg | ORAL_TABLET | Freq: Two times a day (BID) | ORAL | Status: DC
  Administered 2022-08-05 – 2022-08-06 (×2): 5 mg via ORAL
  Filled 2022-08-05 (×2): qty 1

## 2022-08-05 MED ORDER — ONDANSETRON HCL 4 MG/2ML IJ SOLN: 4.0000 mg | Freq: Four times a day (QID) | INTRAMUSCULAR | Status: DC | PRN

## 2022-08-05 MED ORDER — ACETAMINOPHEN 325 MG PO TABS: 650.0000 mg | ORAL_TABLET | Freq: Four times a day (QID) | ORAL | Status: DC | PRN

## 2022-08-05 MED ORDER — LACTATED RINGERS IV BOLUS
1000.0000 mL | Freq: Once | INTRAVENOUS | Status: AC
  Administered 2022-08-05: 1000 mL via INTRAVENOUS

## 2022-08-05 MED ORDER — ONDANSETRON HCL 4 MG PO TABS: 4.0000 mg | ORAL_TABLET | Freq: Four times a day (QID) | ORAL | Status: DC | PRN

## 2022-08-05 MED ORDER — POTASSIUM CHLORIDE CRYS ER 20 MEQ PO TBCR
40.0000 meq | EXTENDED_RELEASE_TABLET | Freq: Once | ORAL | Status: AC
  Administered 2022-08-05: 40 meq via ORAL
  Filled 2022-08-05: qty 2

## 2022-08-05 MED ORDER — AMIODARONE HCL 200 MG PO TABS
200.0000 mg | ORAL_TABLET | Freq: Every day | ORAL | Status: DC
  Administered 2022-08-06: 200 mg via ORAL
  Filled 2022-08-05: qty 1

## 2022-08-05 MED ORDER — ACETAMINOPHEN 650 MG RE SUPP: 650.0000 mg | Freq: Four times a day (QID) | RECTAL | Status: DC | PRN

## 2022-08-05 NOTE — ED Notes (Signed)
Pt to room 7 from triage.

## 2022-08-05 NOTE — ED Notes (Signed)
Pt +orthostatic BP from lying to sitting - notified dr. Charna Archer

## 2022-08-05 NOTE — ED Triage Notes (Addendum)
Pt sent from [facility] for eval of near syncopal episode.  Pt reports feeling lightheaded.  Decreased appetite.  Dizzyf or 2-3 days.  No chest pain or sob.  hx Pacemaker   Pt alert

## 2022-08-05 NOTE — H&P (Signed)
History and Physical    Chief Complaint: Dizzy.   HISTORY OF PRESENT ILLNESS: Daisy Parks is an 88 y.o. female  seen for dizzy and feeling bad and passed  out , in her kitchen she fell to the ground. She was holding on to the side. She had LOC and then she woke up.  She lives alone.  She  has children and they help her, esp her daughter.  Pt denies any chest pain. Pt has PMH as below: Past Medical History:  Diagnosis Date   Atrial fibrillation ([place])    Breast cancer ([place]) 2229   LUMPECTOMY   Cancer ([place])    Coronary artery disease    Hyperlipidemia    Hypertension    Syncope and collapse    Review of Systems  Gastrointestinal:  Positive for nausea.  Neurological:  Positive for dizziness.  All other systems reviewed and are negative.  Allergies  Allergen Reactions   Diltiazem     Other reaction(s): Dizziness dizzy dizzy    Hydrochlorothiazide     Other reaction(s): Dizziness   Chlorthalidone Other (See Comments)    Other reaction(s): Other (See Comments) Exacerbates afib Exacerbates afib    Pravastatin     Other reaction(s): Other (See Comments), Other (See Comments) Other reaction(s): MUSCLE PAIN Muscle pain  Other reaction(s): MUSCLE PAIN Muscle pain     Venlafaxine     Other reaction(s): Other (See Comments) Light headed Light headed    Doxazosin Palpitations    and Hypotension and Hypotension    Enalapril Itching    Other reaction(s): Other (See Comments), Other (See Comments) Cough  Cough     Losartan Palpitations   Mometasone Rash   Past Surgical History:  Procedure Laterality Date   BREAST LUMPECTOMY  1992   LEFT BREAST   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   FASCUCUTANEOUS GRAFT   Social History   Socioeconomic History   Marital status: Married    Spouse name: Not on file   Number of children: Not on file   Years of education: Not on file   Highest education level: Not on file  Occupational History    Not on file  Tobacco Use   Smoking status: Never   Smokeless tobacco: Never  Substance and Sexual Activity   Alcohol use: No    Alcohol/week: 0.0 standard drinks of alcohol   Drug use: No   Sexual activity: Not Currently  Other Topics Concern   Not on file  Social History Narrative   Not on file   Social Determinants of Health   Financial Resource Strain: Not on file  Food Insecurity: No Food Insecurity (08/05/2022)   Hunger Vital Sign    Worried About Running Out of Food in the Last Year: Never true    Ran Out of Food in the Last Year: Never true  Transportation Needs: No Transportation Needs (08/05/2022)   PRAPARE - Hydrologist (Medical): No    Lack of Transportation (Non-Medical): No  Physical Activity: Not on file  Stress: Not on file  Social Connections: Not on file     CURRENT MEDS:    Current Facility-Administered Medications (Cardiovascular):    amiodarone (PACERONE) tablet 200 mg   metoprolol tartrate (LOPRESSOR) tablet 12.5 mg     Current Facility-Administered Medications (Analgesics):    acetaminophen (TYLENOL) tablet 650 mg **OR** acetaminophen (TYLENOL) suppository 650 mg   Current Facility-Administered Medications (Hematological):    apixaban (  ELIQUIS) tablet 5 mg   Current Facility-Administered Medications (Other):    ondansetron (ZOFRAN) tablet 4 mg **OR** ondansetron (ZOFRAN) injection 4 mg  No current outpatient medications on file.    ED Course: Pt in Ed alert/ gives history.  Vitals:   08/05/22 2000 08/05/22 2238 08/05/22 2256 08/05/22 2335  BP: (!) 169/93  (!) 147/82 (!) 157/66  Pulse: 91  75 62  Resp: '16  18 18  '$ Temp:   97.8 F (36.6 C) 97.6 F (36.4 C)  TempSrc:    Oral  SpO2: 98%  97% 99%  Weight:  62.7 kg    Height:  '5\' 5"'$  (1.651 m)     No intake/output data recorded. SpO2: 99 % Blood work in ed shows low potassium and glucose is 136 and creatinine is 1.25. Pt sees cardiology at [facility]  for a.fib and pacemaker.  Pacemaker was last interrogated  Results for orders placed or performed during the hospital encounter of 08/05/22 (from the past 48 hour(s))  Basic metabolic panel     Status: Abnormal   Collection Time: 08/05/22  4:41 PM  Result Value Ref Range   Sodium 136 135 - 145 mmol/L   Potassium 3.2 (L) 3.5 - 5.1 mmol/L   Chloride 102 98 - 111 mmol/L   CO2 27 22 - 32 mmol/L   Glucose, Bld 136 (H) 70 - 99 mg/dL    Comment: Glucose reference range applies only to samples taken after fasting for at least 8 hours.   BUN 17 8 - 23 mg/dL   Creatinine, Ser 1.25 (H) 0.44 - 1.00 mg/dL   Calcium 9.7 8.9 - 10.3 mg/dL   GFR, Estimated 41 (L) >60 mL/min    Comment: (NOTE) Calculated using the CKD-EPI Creatinine Equation (2021)    Anion gap 7 5 - 15    Comment: Performed at [facility], [address] [place], [place] [address]  CBC     Status: Abnormal   Collection Time: 08/05/22  4:41 PM  Result Value Ref Range   WBC 10.0 4.0 - 10.5 K/uL   RBC 5.49 (H) 3.87 - 5.11 MIL/uL   Hemoglobin 16.5 (H) 12.0 - 15.0 g/dL   HCT 47.2 (H) 36.0 - 46.0 %   MCV 86.0 80.0 - 100.0 fL   MCH 30.1 26.0 - 34.0 pg   MCHC 35.0 30.0 - 36.0 g/dL   RDW 12.6 11.5 - 15.5 %   Platelets 256 150 - 400 K/uL   nRBC 0.0 0.0 - 0.2 %    Comment: Performed at [facility], [place], [place] [address]  Troponin I (High Sensitivity)     Status: None   Collection Time: 08/05/22  4:41 PM  Result Value Ref Range   Troponin I (High Sensitivity) 16 <18 ng/L    Comment: (NOTE) Elevated high sensitivity troponin I (hsTnI) values and significant  changes across serial measurements may suggest ACS but many other  chronic and acute conditions are known to elevate hsTnI results.  Refer to the "Links" section for chest pain algorithms and additional  guidance. Performed at [facility], [address] [place], [place] [address]   Brain natriuretic peptide     Status:  Abnormal   Collection Time: 08/05/22  4:47 PM  Result Value Ref Range   B Natriuretic Peptide 168.5 (H) 0.0 - 100.0 pg/mL    Comment: Performed at [facility], [address] [place], [place] [address]  Urinalysis, Routine w  reflex microscopic Urine, Clean Catch     Status: Abnormal   Collection Time: 08/05/22  6:19 PM  Result Value Ref Range   Color, Urine YELLOW (A) YELLOW   APPearance HAZY (A) CLEAR   Specific Gravity, Urine 1.005 1.005 - 1.030   pH 7.0 5.0 - 8.0   Glucose, UA NEGATIVE NEGATIVE mg/dL   Hgb urine dipstick NEGATIVE NEGATIVE   Bilirubin Urine NEGATIVE NEGATIVE   Ketones, ur NEGATIVE NEGATIVE mg/dL   Protein, ur NEGATIVE NEGATIVE mg/dL   Nitrite NEGATIVE NEGATIVE   Leukocytes,Ua LARGE (A) NEGATIVE   RBC / HPF 0-5 0 - 5 RBC/hpf   WBC, UA 11-20 0 - 5 WBC/hpf   Bacteria, UA RARE (A) NONE SEEN   Squamous Epithelial / LPF 6-10 0 - 5   Mucus PRESENT    Hyaline Casts, UA PRESENT     Comment: Performed at [facility], [place], [place] [address]  Troponin I (High Sensitivity)     Status: None   Collection Time: 08/05/22  6:19 PM  Result Value Ref Range   Troponin I (High Sensitivity) 15 <18 ng/L    Comment: (NOTE) Elevated high sensitivity troponin I (hsTnI) values and significant  changes across serial measurements may suggest ACS but many other  chronic and acute conditions are known to elevate hsTnI results.  Refer to the "Links" section for chest pain algorithms and additional  guidance. Performed at [facility], [address] [place], [place] [address]   T4, free     Status: Abnormal   Collection Time: 08/05/22  6:19 PM  Result Value Ref Range   Free T4 1.47 (H) 0.61 - 1.12 ng/dL    Comment: (NOTE) Biotin ingestion may interfere with free T4 tests. If the results are inconsistent with the TSH level, previous test results, or the clinical presentation, then consider biotin interference. If needed, order  repeat testing after stopping biotin. Performed at [facility], [address] [place], [place] [address]   TSH     Status: None   Collection Time: 08/05/22  6:19 PM  Result Value Ref Range   TSH 4.012 0.350 - 4.500 uIU/mL    Comment: Performed by a 3rd Generation assay with a functional sensitivity of <=0.01 uIU/mL. Performed at [facility], [address] [place], [place] [address]     In Ed pt received  Meds ordered this encounter  Medications   potassium chloride SA (KLOR-CON M) CR tablet 40 mEq   lactated ringers bolus 1,000 mL   amiodarone (PACERONE) tablet 200 mg    Continue until 8/15 and then start taking once daily.     apixaban (ELIQUIS) tablet 5 mg   OR Linked Order Group    acetaminophen (TYLENOL) tablet 650 mg    acetaminophen (TYLENOL) suppository 650 mg   OR Linked Order Group    ondansetron (ZOFRAN) tablet 4 mg    ondansetron (ZOFRAN) injection 4 mg   metoprolol tartrate (LOPRESSOR) tablet 12.5 mg    Unresulted Labs (From admission, onward)    None      Admission Imaging : DG Chest Port 1 View  Result Date: 08/05/2022 CLINICAL DATA:  Lightheadedness, near syncope EXAM: PORTABLE CHEST 1 VIEW COMPARISON:  04/27/2022 FINDINGS: Single frontal view of the chest demonstrates stable dual lead pacer. The cardiac silhouette is unremarkable. No airspace disease, effusion, or pneumothorax. No acute bony abnormalities. Postsurgical changes left axilla. IMPRESSION: 1. No acute intrathoracic process. Electronically Signed  By: Randa Ngo M.D.   On: 08/05/2022 17:05    Physical Examination: Vitals:   08/05/22 2000 08/05/22 2238 08/05/22 2256 08/05/22 2335  BP: (!) 169/93  (!) 147/82 (!) 157/66  Pulse: 91  75 62  Temp:   97.8 F (36.6 C) 97.6 F (36.4 C)  Resp: '16  18 18  '$ Height:  '5\' 5"'$  (1.651 m)    Weight:  62.7 kg    SpO2: 98%  97% 99%  TempSrc:    Oral  BMI (Calculated):  23     Physical Exam Vitals and nursing note reviewed.   Constitutional:      General: She is not in acute distress.    Appearance: Normal appearance. She is not ill-appearing, toxic-appearing or diaphoretic.  HENT:     Head: Normocephalic and atraumatic.     Right Ear: Hearing and external ear normal.     Left Ear: Hearing and external ear normal.     Nose: Nose normal. No nasal deformity.     Mouth/Throat:     Lips: Pink.     Mouth: Mucous membranes are moist.     Tongue: No lesions.     Pharynx: Oropharynx is clear.  Eyes:     Extraocular Movements: Extraocular movements intact.     Pupils: Pupils are equal, round, and reactive to light.  Neck:     Vascular: No carotid bruit.  Cardiovascular:     Rate and Rhythm: Normal rate and regular rhythm.     Pulses: Normal pulses.     Heart sounds: Normal heart sounds.  Pulmonary:     Effort: Pulmonary effort is normal.     Breath sounds: Normal breath sounds.  Abdominal:     General: Bowel sounds are normal. There is no distension.     Palpations: Abdomen is soft. There is no mass.     Tenderness: There is no abdominal tenderness. There is no guarding.     Hernia: No hernia is present.  Musculoskeletal:     Right lower leg: No edema.     Left lower leg: No edema.  Skin:    General: Skin is warm.  Neurological:     General: No focal deficit present.     Mental Status: She is alert and oriented to person, place, and time.     Cranial Nerves: Cranial nerves 2-12 are intact.     Motor: Motor function is intact.  Psychiatric:        Attention and Perception: Attention normal.        Mood and Affect: Mood normal.        Speech: Speech normal.        Behavior: Behavior normal. Behavior is cooperative.        Cognition and Memory: Cognition normal.      Assessment and Plan: * Dizziness Attribute to iatrogenic , due to her medications.  We will hold BP meds and diuretic. Supportive care with fall precaution. PT prior to discharge.  Orthostatic vitals . Identifying and treating  underlying cause,.     Orthostatic hypotension Vitals:   08/05/22 1610 08/05/22 1630 08/05/22 1735 08/05/22 1736  BP: (!) 82/46 135/64 (!) 155/68 128/67   08/05/22 1907 08/05/22 1930 08/05/22 2000 08/05/22 2256  BP: (!) 188/72 (!) 192/75 (!) 169/93 (!) 147/82   08/05/22 2335  BP: (!) 157/66  Again hold meds, and orthostatic bp check and PT eval PT discharge.    Essential hypertension Blood pressure (!) 157/66, pulse  62, temperature 97.6 F (36.4 C), temperature source Oral, resp. rate 18, height '5\' 5"'$  (1.651 m), weight 62.7 kg, SpO2 99 %. Prn hydralazine only.    Hypokalemia Replace and follow levels.   Atrial fibrillation (HCC) We will cont amiodarone and eliquis. Metoprolol held due to hypotension.    DVT prophylaxis:  Eliquis    Code Status:  Full code    Family Communication:  Allisha, Harter (Son)  [phone]    Disposition Plan:  Home    Consults called:  None   Admission status: Observation    Unit/ Expected LOS: 1-2 days/    Para Skeans MD [facility]  6 PM- 2 AM. Please contact me via secure Chat 6 PM-2 AM. [phone] ( Pager ) To contact the [facility] Attending or Consulting provider [place] or covering provider during after hours [place], for this patient.   Check the care team in [facility] and look for a) attending/consulting [facility] provider listed and b) the [facility] team listed Log into [email or web address] and use [place]'s universal password to access. If you do not have the password, please contact the hospital operator. Locate the [facility] provider you are looking for under [facility] and page to a number that you can be directly reached. If you still have difficulty reaching the provider, please page the [facility] (Director on Call) for the Hospitalists listed on amion for assistance. [email or web address] 08/06/2022, 12:53 AM

## 2022-08-05 NOTE — ED Provider Notes (Signed)
[facility] Provider Note    Event Date/Time   First MD Initiated Contact with Patient 08/05/22 1646     (approximate)   History   Chief Complaint Near Syncope   HPI  Daisy Parks is a 88 y.o. female with past medical history of hypertension, CAD, atrial fibrillation on Eliquis, and anxiety who presents to the ED complaining of dizziness.  Patient reports that she has been feeling intermittently dizzy and lightheaded for the past 3 days, with symptoms becoming more severe today.  She denies any associated chest pain or shortness of breath, does state that the symptoms seem to be worse when she tries to stand up and walk.  She states that she has to hold onto things to prevent herself from falling, will often feel like she is going to pass out.  She thinks she may have lost consciousness for a few seconds earlier today, denies falling or hitting her head.  She does state that she has had very poor appetite for the past year with significant weight loss over that time.  She does endorse recent dysuria, denies any fevers or flank pain.     Physical Exam   Triage Vital Signs: ED Triage Vitals  Enc Vitals Group     BP 08/05/22 1610 (!) 82/46     Pulse Rate 08/05/22 1610 63     Resp 08/05/22 1610 20     Temp 08/05/22 1610 97.9 F (36.6 C)     Temp Source 08/05/22 1610 Oral     SpO2 08/05/22 1610 97 %     Weight 08/05/22 1608 128 lb (58.1 kg)     Height 08/05/22 1608 '5\' 5"'$  (1.651 m)     Head Circumference --      Peak Flow --      Pain Score 08/05/22 1608 4     Pain Loc --      Pain Edu? --      Excl. in [place]? --     Most recent vital signs: Vitals:   08/05/22 1907 08/05/22 1930  BP: (!) 188/72 (!) 192/75  Pulse: 60 (!) 59  Resp: 16 14  Temp:    SpO2: 99% 99%    Constitutional: Alert and oriented. Eyes: Conjunctivae are normal. Head: Atraumatic. Nose: No congestion/rhinnorhea. Mouth/Throat: Mucous membranes are moist.  Cardiovascular: Normal  rate, regular rhythm. Grossly normal heart sounds.  2+ radial pulses bilaterally. Respiratory: Normal respiratory effort.  No retractions. Lungs CTAB. Gastrointestinal: Soft and nontender. No distention. Musculoskeletal: No lower extremity tenderness nor edema.  Neurologic:  Normal speech and language. No gross focal neurologic deficits are appreciated.    ED Results / Procedures / Treatments   Labs (all labs ordered are listed, but only abnormal results are displayed) Labs Reviewed  BASIC METABOLIC PANEL - Abnormal; Notable for the following components:      Result Value   Potassium 3.2 (*)    Glucose, Bld 136 (*)    Creatinine, Ser 1.25 (*)    GFR, Estimated 41 (*)    All other components within normal limits  CBC - Abnormal; Notable for the following components:   RBC 5.49 (*)    Hemoglobin 16.5 (*)    HCT 47.2 (*)    All other components within normal limits  URINALYSIS, ROUTINE W REFLEX MICROSCOPIC - Abnormal; Notable for the following components:   Color, Urine YELLOW (*)    APPearance HAZY (*)    Leukocytes,Ua LARGE (*)  Bacteria, UA RARE (*)    All other components within normal limits  BRAIN NATRIURETIC PEPTIDE  TROPONIN I (HIGH SENSITIVITY)  TROPONIN I (HIGH SENSITIVITY)     EKG  ED ECG REPORT I, Blake Divine, the attending physician, personally viewed and interpreted this ECG.   Date: 08/05/2022  EKG Time: 16:20  Rate: 60  Rhythm: Atrial paced rhytm  Axis: LAD  Intervals:first-degree A-V block   ST&T Change: None  RADIOLOGY Chest x-ray reviewed and interpreted by me with no infiltrate, edema, or effusion.  PROCEDURES:  Critical Care performed: No  Procedures   MEDICATIONS ORDERED IN ED: Medications  potassium chloride SA (KLOR-CON M) CR tablet 40 mEq (40 mEq Oral Given 08/05/22 1857)  lactated ringers bolus 1,000 mL (0 mLs Intravenous Stopped 08/05/22 1858)     IMPRESSION / MDM / ASSESSMENT AND PLAN / ED COURSE  I reviewed the triage  vital signs and the nursing notes.                              88 y.o. female with past medical history of hypertension, CAD, atrial fibrillation on Eliquis, and anxiety who presents to the ED complaining of dizziness and lightheadedness for the past 3 days, worse today with brief episode of syncope.  Patient's presentation is most consistent with acute presentation with potential threat to life or bodily function.  Differential diagnosis includes, but is not limited to, arrhythmia, ACS, PE, dehydration, electrolyte abnormality, AKI, UTI.  Patient well-appearing and in no acute distress, vital signs initially remarkable for borderline hypotension, however improved without intervention once she was placed in a stretcher.  EKG shows no evidence of arrhythmia or ischemia, we will interrogate her pacemaker to ensure no episodes earlier in the day.  She states that symptoms are worse when she goes to stand up, will check orthostatic vital signs.  She has no focal neurologic deficits on exam and describes symptoms as a lightheadedness, low suspicion for stroke.  Labs remarkable for elevated hemoglobin and mild AKI, she may be dehydrated causing hemoconcentration with her recent poor p.o. intake.  She also has mild hypokalemia which we will replete.  Troponin within normal limits and I have overall low suspicion for cardiac etiology.  Patient noted to have significant orthostatic hypotension, pacemaker was interrogated and results pending at this time but no events noted on cardiac monitor.  Repeat troponin is stable.  Case discussed with hospitalist for admission for further observation for syncope.      FINAL CLINICAL IMPRESSION(S) / ED DIAGNOSES   Final diagnoses:  Syncope, unspecified syncope type  AKI (acute kidney injury) ([place])     Rx / DC Orders   ED Discharge Orders     None        Note:  This document was prepared using Dragon voice recognition software and may include  unintentional dictation errors.   Blake Divine, MD 08/05/22 2007

## 2022-08-05 NOTE — ED Notes (Signed)
Pacemaker was interrogated.

## 2022-08-06 DIAGNOSIS — R42 Dizziness and giddiness: Secondary | ICD-10-CM | POA: Diagnosis not present

## 2022-08-06 LAB — MAGNESIUM: Magnesium: 2.1 mg/dL (ref 1.7–2.4)

## 2022-08-06 LAB — BASIC METABOLIC PANEL
Anion gap: 10 (ref 5–15)
BUN: 16 mg/dL (ref 8–23)
CO2: 24 mmol/L (ref 22–32)
Calcium: 9.2 mg/dL (ref 8.9–10.3)
Chloride: 103 mmol/L (ref 98–111)
Creatinine, Ser: 0.96 mg/dL (ref 0.44–1.00)
GFR, Estimated: 57 mL/min — ABNORMAL LOW (ref >60)
Glucose, Bld: 118 mg/dL — ABNORMAL HIGH (ref 70–99)
Potassium: 3.9 mmol/L (ref 3.5–5.1)
Sodium: 137 mmol/L (ref 135–145)

## 2022-08-06 MED ORDER — ENSURE ENLIVE PO LIQD: 237.0000 mL | Freq: Two times a day (BID) | ORAL | 12 refills | Status: DC

## 2022-08-06 MED ORDER — ADULT MULTIVITAMIN W/MINERALS CH: 1.0000 | ORAL_TABLET | Freq: Every day | ORAL | Status: DC

## 2022-08-06 MED ORDER — AMIODARONE HCL 200 MG PO TABS: 200.0000 mg | ORAL_TABLET | Freq: Every day | ORAL | 0 refills | Status: DC

## 2022-08-06 MED ORDER — SPIRONOLACTONE 25 MG PO TABS: 12.5000 mg | ORAL_TABLET | Freq: Every day | ORAL | 0 refills | Status: DC

## 2022-08-06 MED ORDER — METOPROLOL SUCCINATE ER 25 MG PO TB24: 12.5000 mg | ORAL_TABLET | Freq: Two times a day (BID) | ORAL | 0 refills | Status: DC

## 2022-08-06 MED ORDER — HYDROXYZINE HCL 10 MG PO TABS: 10.0000 mg | ORAL_TABLET | Freq: Three times a day (TID) | ORAL | 0 refills | Status: DC | PRN

## 2022-08-06 MED ORDER — HYDROXYZINE HCL 10 MG PO TABS
10.0000 mg | ORAL_TABLET | Freq: Three times a day (TID) | ORAL | Status: DC | PRN
  Administered 2022-08-06: 10 mg via ORAL
  Filled 2022-08-06 (×2): qty 1

## 2022-08-06 MED ORDER — ADULT MULTIVITAMIN W/MINERALS CH
1.0000 | ORAL_TABLET | Freq: Every day | ORAL | Status: DC
  Administered 2022-08-06: 1 via ORAL
  Filled 2022-08-06: qty 1

## 2022-08-06 MED ORDER — FUROSEMIDE 20 MG PO TABS: 20.0000 mg | ORAL_TABLET | Freq: Every day | ORAL | 0 refills | Status: DC | PRN

## 2022-08-06 MED ORDER — ENSURE ENLIVE PO LIQD
237.0000 mL | Freq: Two times a day (BID) | ORAL | Status: DC
  Administered 2022-08-06 (×2): 237 mL via ORAL

## 2022-08-06 NOTE — Assessment & Plan Note (Addendum)
Normalized with replacement. Repeat BMP in 1 week at follow up.

## 2022-08-06 NOTE — Assessment & Plan Note (Addendum)
Continue amiodarone and eliquis. Metoprolol held initially, resumed at reduced dose on 12.5 mg BID. Close outpatient follow up recommended.

## 2022-08-06 NOTE — Progress Notes (Signed)
Mobility Specialist - Progress Note   08/06/22 1014  Mobility  Activity Ambulated with assistance in hallway;Stood at bedside;Dangled on edge of bed  Level of Assistance Standby assist, set-up cues, supervision of patient - no hands on  Assistive Device Cane  Distance Ambulated (ft) 160 ft  Activity Response Tolerated well  Mobility Referral Yes  $Mobility charge 1 Mobility   Pt supine in bed on RA upon arrival. Pt completes bed mobility and dons shoes and pants indep. Pt STS and ambulates 1 lap around NS SBA with no LOB but unsteady gait at times. Pt returns to bed with needs in reach and bed alarm set.   Gretchen Short  Mobility Specialist  08/06/22 10:16 AM

## 2022-08-06 NOTE — Assessment & Plan Note (Addendum)
Vitals:   08/05/22 1610 08/05/22 1630 08/05/22 1735 08/05/22 1736  BP: (!) 82/46 135/64 (!) 155/68 128/67   08/05/22 1907 08/05/22 1930 08/05/22 2000 08/05/22 2256  BP: (!) 188/72 (!) 192/75 (!) 169/93 (!) 147/82   08/05/22 2335  BP: (!) 157/66   Improved after IV fluids and held medications. Reduced doses of meds as outlined.

## 2022-08-06 NOTE — Assessment & Plan Note (Addendum)
BP's were orthostatic, improved after fluids. Now on lower dose metoprolol, lower dose spironolactone.  Lasix daily PRN only.

## 2022-08-06 NOTE — Progress Notes (Signed)
Initial Nutrition Assessment  DOCUMENTATION CODES:   Not applicable  INTERVENTION:   -MVI with minerals daily -Ensure Enlive po BID, each supplement provides 350 kcal and 20 grams of protein -Liberalize diet to regular for widest variety of meal selections  NUTRITION DIAGNOSIS:   Inadequate oral intake related to decreased appetite as evidenced by per patient/family report.  GOAL:   Patient will meet greater than or equal to 90% of their needs  MONITOR:   PO intake, Supplement acceptance  REASON FOR ASSESSMENT:   Malnutrition Screening Tool    ASSESSMENT:   Pt with past medical history of hypertension, CAD, atrial fibrillation on Eliquis, and anxiety who presents to complaining of dizziness.  Pt admitted with dizziness.   Reviewed I/O's: +240 ml x 24 hours  Spoke with pt at bedside. She reports feeling better today, but feels very anxious about being in the hospital. Supportive presence provided. Per pt, she has experienced a general decline in health over the past few months, but has worsened over the past month. Pt shares that she has a very poor appetite and does not feel like eating ("I eat because I know I have to, not because I want to"). She also complains of nausea after eating a meal. Intake has decreased, especially over the past month. Pt generally consumes 2 meals per day, which consist of a sandwich or green vegetables. She also snacks on popcorn and ice cream sandwiches. Observed pt eating toast and eggs without difficulty. She is currently on a heart healthy diet and consuming 80% of meals.   Per pt, her UBW is around 175#, which she last weighed in the summer time this year. Per pt, she estimates she has lost about 50# over the past 5-6 months. Reviewed wt hx; pt has experienced a 14.9% wt loss over the past 5 months, which is significant for time frame.   Per pt, she has been feeling more weak and reports she tripped PTA. Discussed importance of good meal and  supplement intake to promote healing. Pt amenable to supplements.   Medications reviewed.   Labs reviewed.   NUTRITION - FOCUSED PHYSICAL EXAM:  Flowsheet Row Most Recent Value  Orbital Region No depletion  Upper Arm Region No depletion  Thoracic and Lumbar Region No depletion  Buccal Region No depletion  Temple Region Mild depletion  Clavicle Bone Region No depletion  Clavicle and Acromion Bone Region No depletion  Scapular Bone Region No depletion  Dorsal Hand Mild depletion  Patellar Region Mild depletion  Anterior Thigh Region Mild depletion  Posterior Calf Region Mild depletion  Edema (RD Assessment) None  Hair Reviewed  Eyes Reviewed  Mouth Reviewed  Skin Reviewed  Nails Reviewed       Diet Order:   Diet Order             Diet regular Room service appropriate? Yes; Fluid consistency: Thin  Diet effective now                   EDUCATION NEEDS:   Education needs have been addressed  Skin:  Skin Assessment: Reviewed RN Assessment  Last BM:  08/04/22  Height:   Ht Readings from Last 1 Encounters:  08/05/22 '5\' 5"'$  (1.651 m)    Weight:   Wt Readings from Last 1 Encounters:  08/05/22 62.7 kg    Ideal Body Weight:  56.8 kg  BMI:  Body mass index is 23 kg/m.  Estimated Nutritional Needs:   Kcal:  5638-9373  Protein:  85-100 grams  Fluid:  > 1.7 L    Loistine Chance, RD, LDN, [place] Registered Dietitian II Certified Diabetes Care and Education Specialist Please refer to [facility] for RD and/or RD on-call/weekend/after hours pager

## 2022-08-06 NOTE — Assessment & Plan Note (Addendum)
With Orthostatic hypotension, likely iatrogenic, due to medications.  --Reduced metoprolol and spironolactone --Change Lasix to daily AS NEEDED --Orthostatic vitals normalized after IV hydration and de-escalated medications.  --Close outpatient follow up --Home BP monitoring  --[facility] RN ordered to assist with monitoring given medication changes.

## 2022-08-06 NOTE — TOC Initial Note (Signed)
Transition of Care [facility]) - Initial/Assessment Note    Patient Details  Name: Daisy Parks MRN: [id number] Date of Birth: 11-13-1931  Transition of Care [facility]) CM/SW Contact:    Laurena Slimmer, RN Phone Number: 08/06/2022, 1:16 PM  Clinical Narrative:                 Spoke with patient and her daughter at bedside regarding discharge plan. Patient is agreeable to [facility]. She would prefer [place] via [place].    Referral sent and accepted by [facility] [place].         Patient Goals and CMS Choice        Expected Discharge Plan and Services                                                Prior Living Arrangements/Services                       Activities of Daily Living Home Assistive Devices/Equipment: Cane (specify quad or straight), Walker (specify type) ADL Screening (condition at time of admission) Patient's cognitive ability adequate to safely complete daily activities?: Yes Is the patient deaf or have difficulty hearing?: Yes Does the patient have difficulty seeing, even when wearing glasses/contacts?: Yes Does the patient have difficulty concentrating, remembering, or making decisions?: No Patient able to express need for assistance with ADLs?: Yes Does the patient have difficulty dressing or bathing?: Yes Independently performs ADLs?: Yes (appropriate for developmental age) Does the patient have difficulty walking or climbing stairs?: Yes Weakness of Legs: Both Weakness of Arms/Hands: None  Permission Sought/Granted                  Emotional Assessment              Admission diagnosis:  Dizziness [R42] AKI (acute kidney injury) ([place]) [N17.9] Syncope, unspecified syncope type [R55] Patient Active Problem List   Diagnosis Date Noted   Orthostatic hypotension 08/05/2022   Dizziness 08/05/2022   Bilateral lower extremity edema 04/27/2022   Bilateral leg edema 04/27/2022   Hypokalemia 04/27/2022   Essential hypertension 04/02/2022    Atrial fibrillation ([place]) 05/05/2012   Anxiety state 12/01/2006   PCP:  Caryl Never, MD Pharmacy:   [facility] [address] ([address]), [place] [address] [place]) [place] [address] Phone: [phone] Fax: [phone]     Social Determinants of Health (SDOH) Interventions    Readmission Risk Interventions     No data to display

## 2022-08-06 NOTE — Evaluation (Signed)
Physical Therapy Evaluation Patient Details Name: Daisy Parks MRN: [id number] DOB: 11/04/1931 Today's Date: 08/06/2022  History of Present Illness  Daisy Parks is an 88 y.o. female  seen for dizzy and feeling bad and passed  out , in her kitchen she fell to the ground. She was holding on to the side. She had LOC and then she woke up. Orthostatic  Clinical Impression  Patient received in bed, she reports she just walked with PT (was mobility). Agrees to PT assessment. Patient is mod I for bed mobility and transfers. Ambulated 160 feet with SPC and other hand held assist on rail in hallway. With distraction, or starting/stopping she is unsteady. Patient will benefit from B UE support for safety with mobility and fall prevention. She will continue to benefit from skilled PT while here to improve safety and independence.           Recommendations for follow up therapy are one component of a multi-disciplinary discharge planning process, led by the attending physician.  Recommendations may be updated based on patient status, additional functional criteria and insurance authorization.  Follow Up Recommendations Home health PT (thins she had [facility] recently)      Assistance Recommended at Discharge Frequent or constant Supervision/Assistance  Patient can return home with the following  A little help with walking and/or transfers;A little help with bathing/dressing/bathroom;Assist for transportation;Help with stairs or ramp for entrance;Assistance with cooking/housework;Direct supervision/assist for medications management    Equipment Recommendations Rolling walker (2 wheels)  Recommendations for Other Services       Functional Status Assessment Patient has had a recent decline in their functional status and demonstrates the ability to make significant improvements in function in a reasonable and predictable amount of time.     Precautions / Restrictions Precautions Precautions:  Fall Restrictions Weight Bearing Restrictions: No      Mobility  Bed Mobility Overal bed mobility: Modified Independent                  Transfers Overall transfer level: Needs assistance Equipment used: None Transfers: Sit to/from Stand Sit to Stand: Supervision                Ambulation/Gait Ambulation/Gait assistance: Min guard Gait Distance (Feet): 160 Feet Assistive device: Straight cane Gait Pattern/deviations: Step-through pattern, Decreased step length - right, Decreased step length - left, Staggering right, Staggering left Gait velocity: decr     General Gait Details: unsteady gait, patient reaching for rail in hallway and using cane. Unsteadiness with stopping/starting or distraction when ambulating.  Stairs            Wheelchair Mobility    Modified Rankin (Stroke Patients Only)       Balance Overall balance assessment: Needs assistance, History of Falls Sitting-balance support: Feet supported Sitting balance-Leahy Scale: Normal     Standing balance support: Bilateral upper extremity supported, During functional activity, Reliant on assistive device for balance Standing balance-Leahy Scale: Fair Standing balance comment: unsteady                             Pertinent Vitals/Pain Pain Assessment Pain Assessment: No/denies pain    Home Living Family/patient expects to be discharged to:: Private residence Living Arrangements: Alone Available Help at Discharge: Family;Available 24 hours/day Type of Home: House Home Access: Stairs to enter Entrance Stairs-Rails: Right Entrance Stairs-Number of Steps: 1 in back, has basement, but does nt have to go down  there   Home Layout: Laundry or work area in [place]: Conservation officer, nature (2 wheels);Cane - single point Additional Comments: Reports she has a walker at home, but that it is heavy. She was using cane prior to admission    Prior Function Prior Level of  Function : Independent/Modified Independent;History of Falls (last six months)             Mobility Comments: Uses cane at baseline. Does not drive or go out of the house much. ADLs Comments: Pt reports she's independent bathing, dressing, daughter brings most meals so she just re-heats food for the most part.     Hand Dominance        Extremity/Trunk Assessment   Upper Extremity Assessment Upper Extremity Assessment: Overall WFL for tasks assessed    Lower Extremity Assessment Lower Extremity Assessment: Overall WFL for tasks assessed    Cervical / Trunk Assessment Cervical / Trunk Assessment: Normal  Communication   Communication: HOH  Cognition Arousal/Alertness: Awake/alert Behavior During Therapy: WFL for tasks assessed/performed, Anxious Overall Cognitive Status: Within Functional Limits for tasks assessed                                          General Comments      Exercises     Assessment/Plan    PT Assessment Patient needs continued PT services  PT Problem List Decreased strength;Decreased balance;Decreased mobility;Decreased safety awareness;Decreased knowledge of use of DME       PT Treatment Interventions DME instruction;Therapeutic exercise;Gait training;Stair training;Functional mobility training;Therapeutic activities;Patient/family education    PT Goals (Current goals can be found in the Care Plan section)  Acute Rehab PT Goals Patient Stated Goal: to go home PT Goal Formulation: With patient Time For Goal Achievement: 08/20/22 Potential to Achieve Goals: Good    Frequency Min 2X/week     Co-evaluation               AM-PAC PT "6 Clicks" Mobility  Outcome Measure Help needed turning from your back to your side while in a flat bed without using bedrails?: None Help needed moving from lying on your back to sitting on the side of a flat bed without using bedrails?: None Help needed moving to and from a bed to a  chair (including a wheelchair)?: A Little Help needed standing up from a chair using your arms (e.g., wheelchair or bedside chair)?: A Little Help needed to walk in hospital room?: A Little Help needed climbing 3-5 steps with a railing? : A Little 6 Click Score: 20    End of Session Equipment Utilized During Treatment: Gait belt Activity Tolerance: Patient tolerated treatment well Patient left: in bed;with call bell/phone within reach;with bed alarm set Nurse Communication: Mobility status PT Visit Diagnosis: Unsteadiness on feet (R26.81);Difficulty in walking, not elsewhere classified (R26.2);History of falling (Z91.81)    Time: 8921-1941 PT Time Calculation (min) (ACUTE ONLY): 15 min   Charges:   PT Evaluation $PT Eval Moderate Complexity: 1 Mod          Lametria Klunk, PT, GCS 08/06/22,11:38 AM

## 2022-08-06 NOTE — Discharge Summary (Signed)
Physician Discharge Summary   Patient: Daisy Parks MRN: [id number] DOB: [birth year]  Admit date:     08/05/2022  Discharge date: 08/06/22  Discharge Physician: Ezekiel Slocumb   PCP: Caryl Never, MD   Recommendations at discharge:   Please monitor BP closely Repeat orthostatic vitals at follow up in 1-2 weeks Follow up with Primary Care in 1-2 weeks Repeat BMP, Mg, CBC in 1-2 weeks Repeat TSH and free T4 in 4-6 weeks (TSH was normal, free T4 was mildly elevated) Follow up on anxiety.  Started very low dose PRN Vistaril.  Discharge Diagnoses: Principal Problem:   Dizziness Active Problems:   Orthostatic hypotension   Essential hypertension   Hypokalemia   Atrial fibrillation (HCC)  Resolved Problems:   * No resolved hospital problems. *  Hospital Course: Daisy Parks is a 88 y.o. female with past medical history of hypertension, CAD, atrial fibrillation on Eliquis, and anxiety who presents to the ED complaining of dizziness.   HPI on admission, per Dr. Posey Pronto: "Daisy Parks is an 88 y.o. female  seen for dizzy and feeling bad and passed  out , in Daisy Parks kitchen Daisy Parks fell to the ground. Daisy Parks was holding on to the side. Daisy Parks had LOC and then Daisy Parks woke up.  Daisy Parks lives alone.  Daisy Parks  has children and they help Daisy Parks, esp Daisy Parks.  Pt denies any chest pain."   Patient found to have positive orthostatic vitals. Daisy Parks was given IV fluid resuscitation and orthostatic vitals normalized.    11/15 --  Patient no longer having dizziness/lightheadedness.   Seen by PT who recommended HH PT.   We de-escalated Daisy Parks medications to avoid recurrent orthostatic hypotension. Patient and Parks agreeable Daisy Parks is improved. Stable for discharge home this afternoon. Recommend home BP monitoring and close PCP and/or cardiology follow up regarding medications.    Assessment and Plan:  * Dizziness With Orthostatic hypotension, likely iatrogenic, due to medications.  --Reduced metoprolol  and spironolactone --Change Lasix to daily AS NEEDED --Orthostatic vitals normalized after IV hydration and de-escalated medications.  --Close outpatient follow up --Home BP monitoring  --[facility] RN ordered to assist with monitoring given medication changes.   Orthostatic hypotension Vitals:   08/05/22 1610 08/05/22 1630 08/05/22 1735 08/05/22 1736  BP: (!) 82/46 135/64 (!) 155/68 128/67   08/05/22 1907 08/05/22 1930 08/05/22 2000 08/05/22 2256  BP: (!) 188/72 (!) 192/75 (!) 169/93 (!) 147/82   08/05/22 2335  BP: (!) 157/66   Improved after IV fluids and held medications. Reduced doses of meds as outlined.   Essential hypertension BP's were orthostatic, improved after fluids. Now on lower dose metoprolol, lower dose spironolactone.  Lasix daily PRN only.   Hypokalemia Normalized with replacement. Repeat BMP in 1 week at follow up.  Atrial fibrillation (HCC) Continue amiodarone and eliquis. Metoprolol held initially, resumed at reduced dose on 12.5 mg BID. Close outpatient follow up recommended.          Consultants: None Procedures performed: None  Disposition: Home health Diet recommendation:  Discharge Diet Orders (From admission, onward)     Start     Ordered   08/06/22 0000  Diet - low sodium heart healthy        08/06/22 1419           Cardiac diet DISCHARGE MEDICATION: Allergies as of 08/06/2022       Reactions   Diltiazem    Other reaction(s): Dizziness dizzy dizzy   Hydrochlorothiazide  Other reaction(s): Dizziness   Chlorthalidone Other (See Comments)   Other reaction(s): Other (See Comments) Exacerbates afib Exacerbates afib   Pravastatin    Other reaction(s): Other (See Comments), Other (See Comments) Other reaction(s): MUSCLE PAIN Muscle pain  Other reaction(s): MUSCLE PAIN Muscle pain    Venlafaxine    Other reaction(s): Other (See Comments) Light headed Light headed   Doxazosin Palpitations   and Hypotension and Hypotension    Enalapril Itching   Other reaction(s): Other (See Comments), Other (See Comments) Cough  Cough    Losartan Palpitations   Mometasone Rash        Medication List     STOP taking these medications    mirtazapine 15 MG tablet Commonly known as: REMERON       TAKE these medications    amiodarone 200 MG tablet Commonly known as: PACERONE Take 1 tablet (200 mg total) by mouth daily. Continue until 8/15 and then start taking once daily. What changed: when to take this   apixaban 5 MG Tabs tablet Commonly known as: ELIQUIS Take 5 mg by mouth 2 (two) times daily.   feeding supplement Liqd Take 237 mLs by mouth 2 (two) times daily between meals.   furosemide 20 MG tablet Commonly known as: LASIX Take 1 tablet (20 mg total) by mouth daily as needed (swelling or weight gain). What changed:  when to take this reasons to take this   hydrOXYzine 10 MG tablet Commonly known as: ATARAX Take 1 tablet (10 mg total) by mouth 3 (three) times daily as needed for anxiety.   metoprolol succinate 25 MG 24 hr tablet Commonly known as: TOPROL-XL Take 0.5 tablets (12.5 mg total) by mouth 2 (two) times daily. What changed: how much to take   multivitamin with minerals Tabs tablet Take 1 tablet by mouth daily. Start taking on: August 07, 2022   sertraline 50 MG tablet Commonly known as: ZOLOFT Take 50 mg by mouth daily.   spironolactone 25 MG tablet Commonly known as: ALDACTONE Take 0.5 tablets (12.5 mg total) by mouth daily. What changed: how much to take               Durable Medical Equipment  (From admission, onward)           Start     Ordered   08/06/22 1337  For home use only DME Walker rolling  Once       Question Answer Comment  Walker: With [place]   Patient needs a walker to treat with the following condition Unsteady gait      08/06/22 1337            Discharge Exam: Filed Weights   08/05/22 1608 08/05/22 2238  Weight: 58.1 kg  62.7 kg   General exam: awake, alert, no acute distress HEENT: atraumatic, clear conjunctiva, anicteric sclera, moist mucus membranes, hearing grossly normal  Respiratory system: CTAB, no wheezes, rales or rhonchi, normal respiratory effort. Cardiovascular system: normal S1/S2, RRR, no JVD, murmurs, rubs, gallops, no pedal edema.   Gastrointestinal system: soft, NT, ND, no HSM felt, +bowel sounds. Central nervous system: A&O x4. no gross focal neurologic deficits, normal speech Extremities: moves all, no edema, normal tone Skin: dry, intact, normal temperature, normal color, No rashes, lesions or ulcers Psychiatry: anxious mood, congruent affect, judgement and insight appear normal   Condition at discharge: stable  The results of significant diagnostics from this hospitalization (including imaging, microbiology, ancillary and laboratory) are listed below for  reference.   Imaging Studies: DG Chest Port 1 View  Result Date: 08/05/2022 CLINICAL DATA:  Lightheadedness, near syncope EXAM: PORTABLE CHEST 1 VIEW COMPARISON:  04/27/2022 FINDINGS: Single frontal view of the chest demonstrates stable dual lead pacer. The cardiac silhouette is unremarkable. No airspace disease, effusion, or pneumothorax. No acute bony abnormalities. Postsurgical changes left axilla. IMPRESSION: 1. No acute intrathoracic process. Electronically Signed   By: Randa Ngo M.D.   On: 08/05/2022 17:05    Microbiology: Results for orders placed or performed during the hospital encounter of 04/02/22  Urine Culture     Status: Abnormal   Collection Time: 04/02/22  2:55 PM   Specimen: Urine, Clean Catch  Result Value Ref Range Status   Specimen Description   Final    URINE, CLEAN CATCH Performed at [facility], [address], [place], [place] [address]    Special Requests   Final    NONE Performed at [facility], [address] [place], [place] [address]    Culture MULTIPLE SPECIES PRESENT,  SUGGEST RECOLLECTION (A)  Final   Report Status 04/03/2022 FINAL  Final  SARS Coronavirus 2 by RT PCR (hospital order, performed in [facility] hospital lab) *cepheid single result test* Anterior Nasal Swab     Status: None   Collection Time: 04/02/22 11:29 PM   Specimen: Anterior Nasal Swab  Result Value Ref Range Status   SARS Coronavirus 2 by RT PCR NEGATIVE NEGATIVE Final    Comment: (NOTE) SARS-CoV-2 target nucleic acids are NOT DETECTED.  The SARS-CoV-2 RNA is generally detectable in upper and lower respiratory specimens during the acute phase of infection. The lowest concentration of SARS-CoV-2 viral copies this assay can detect is 250 copies / mL. A negative result does not preclude SARS-CoV-2 infection and should not be used as the sole basis for treatment or other patient management decisions.  A negative result may occur with improper specimen collection / handling, submission of specimen other than nasopharyngeal swab, presence of viral mutation(s) within the areas targeted by this assay, and inadequate number of viral copies (<250 copies / mL). A negative result must be combined with clinical observations, patient history, and epidemiological information.  Fact Sheet for Patients:   https://www.patel.info/  Fact Sheet for Healthcare Providers: https://hall.com/  This test is not yet approved or  cleared by the [place] FDA and has been authorized for detection and/or diagnosis of SARS-CoV-2 by FDA under an Emergency Use Authorization (EUA).  This EUA will remain in effect (meaning this test can be used) for the duration of the COVID-19 declaration under Section 564(b)(1) of the Act, 21 U.S.C. section 360bbb-3(b)(1), unless the authorization is terminated or revoked sooner.  Performed at [facility], [address] [place], [place] [address]     Labs: CBC: Recent Labs  Lab 08/05/22 1641  WBC 10.0   HGB 16.5*  HCT 47.2*  MCV 86.0  PLT 323   Basic Metabolic Panel: Recent Labs  Lab 08/05/22 1641 08/06/22 0846  NA 136 137  K 3.2* 3.9  CL 102 103  CO2 27 24  GLUCOSE 136* 118*  BUN 17 16  CREATININE 1.25* 0.96  CALCIUM 9.7 9.2  MG  --  2.1   Liver Function Tests: No results for input(s): "AST", "ALT", "ALKPHOS", "BILITOT", "PROT", "ALBUMIN" in the last 168 hours. CBG: No results for input(s): "GLUCAP" in the last 168 hours.  Discharge time spent: greater than 30 minutes.  Signed: Ezekiel Slocumb, DO  [facility] 08/06/2022

## 2022-08-07 LAB — URINE CULTURE

## 2022-08-15 NOTE — Progress Notes (Signed)
Hello with a.fib patients sometimes to assess underlying heart failure we check a BNP. Thanks. Maizey Menendez

## 2022-10-14 IMAGING — CT CT HEAD W/O CM
3 series · 16 of 47 positions shown, 19 images · non-contrast
Comparison: No priors.

CLINICAL DATA: 88-year-old female with history of dizziness.

EXAM:
CT HEAD WITHOUT CONTRAST
TECHNIQUE: Contiguous axial images were obtained from the base of the skull
through the vertex without intravenous contrast.

[Series 2: head wo · axial · 0.39mm/px · z∈[+1333,+1458]mm · 10 of 30 slices shown, 13 images]
[im 3/30  brain]
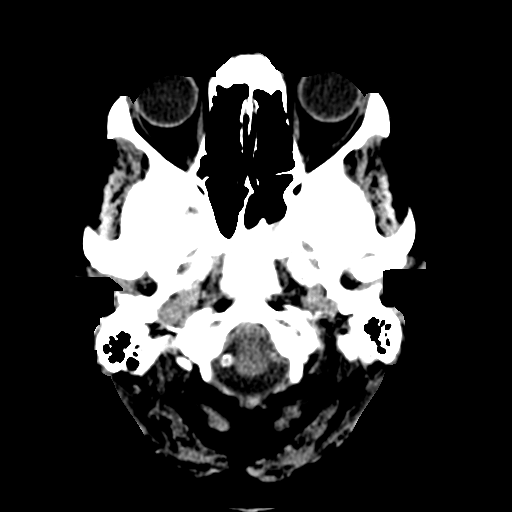
[im 3/30  bone]
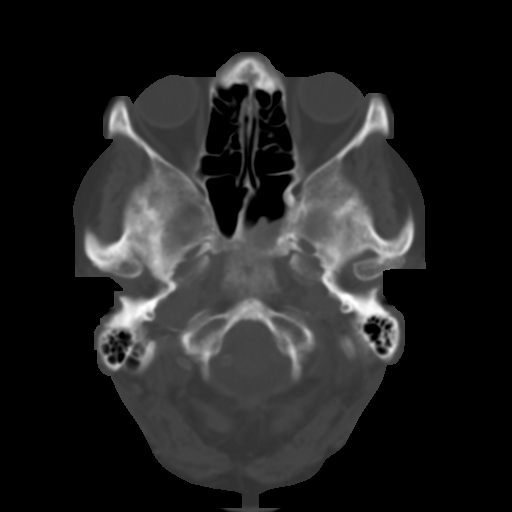
[im 6/30  brain]
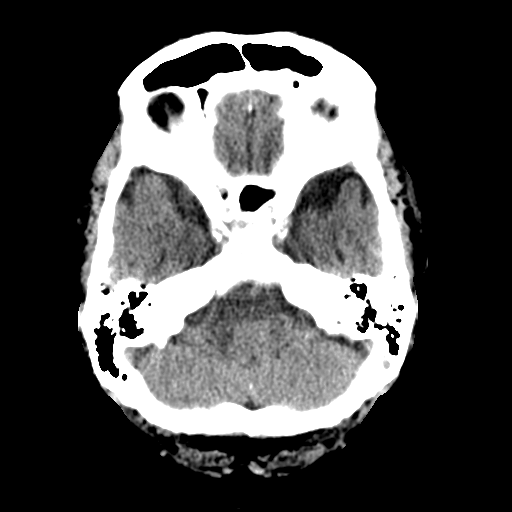
[im 9/30  brain]
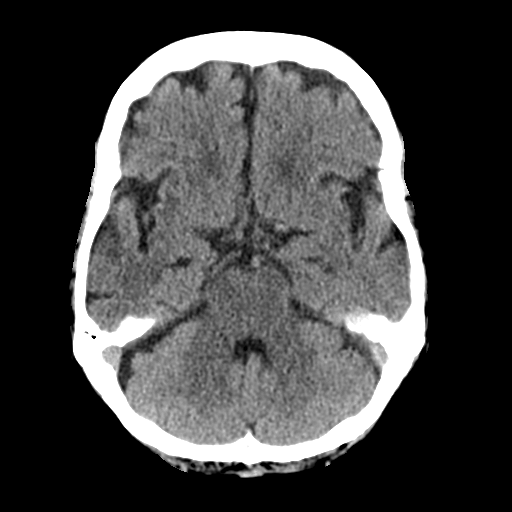
[im 11/30  brain]
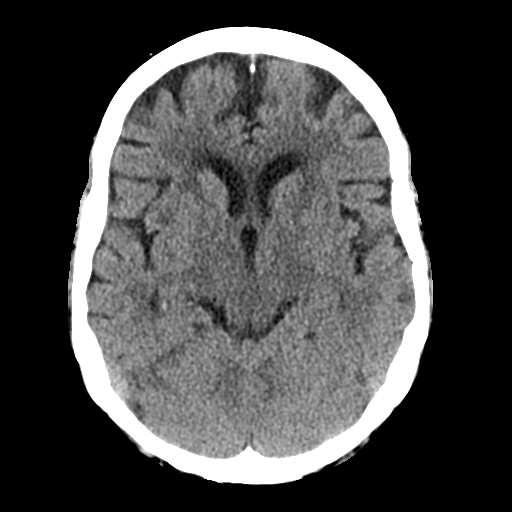
[im 14/30  brain]
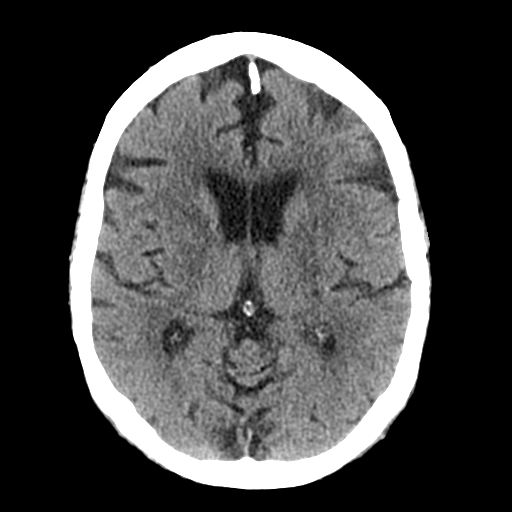
[im 14/30  bone]
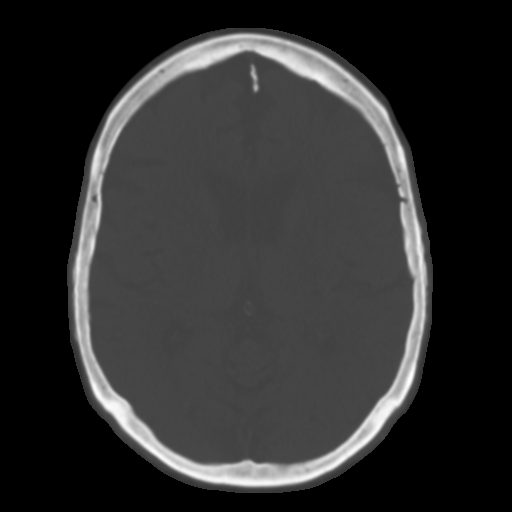
[im 17/30  brain]
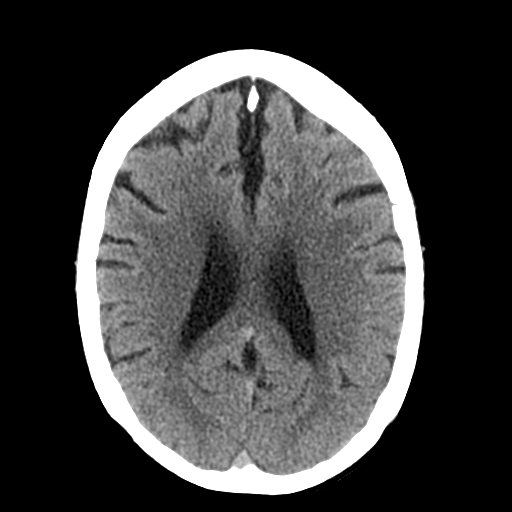
[im 20/30  brain]
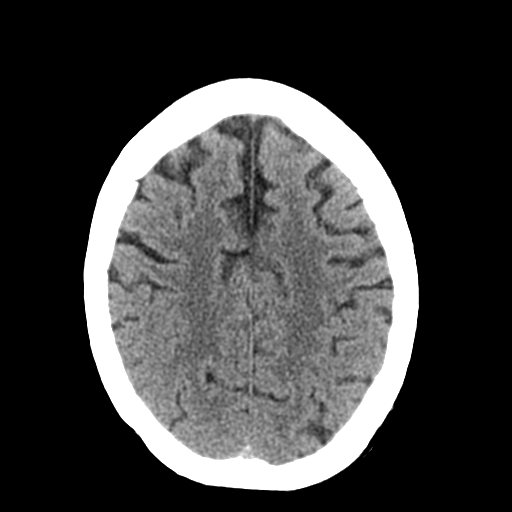
[im 23/30  brain]
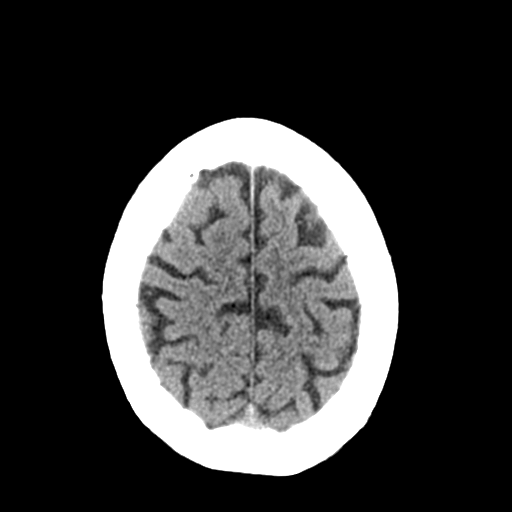
[im 25/30  brain]
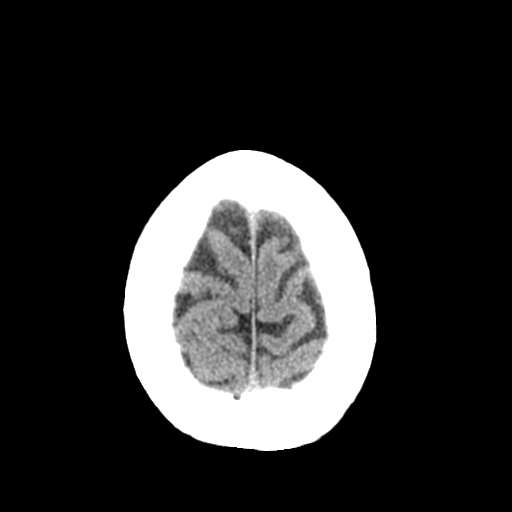
[im 25/30  bone]
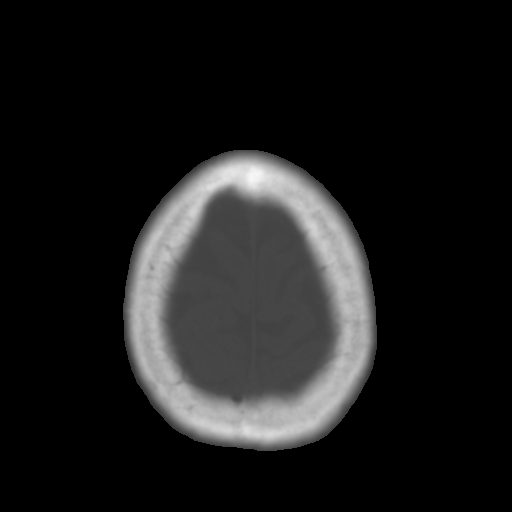
[im 28/30  brain]
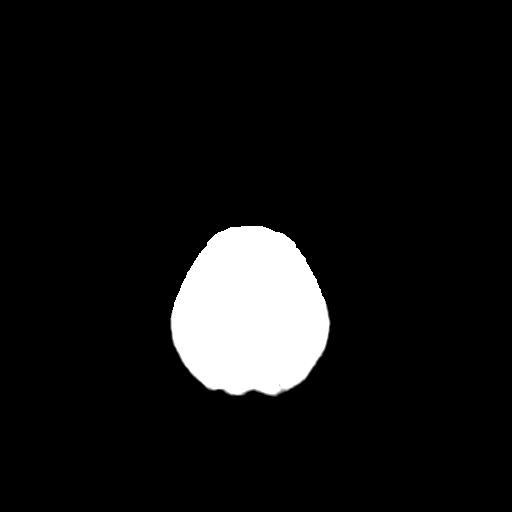

[Series 4: coronal soft tissue · coronal · 0.31mm/px · 3 of 66 slices shown]
[im 22/66  brain]
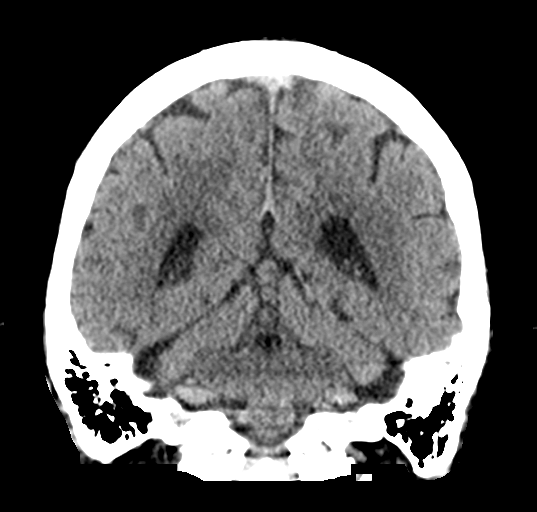
[im 29/66  brain]
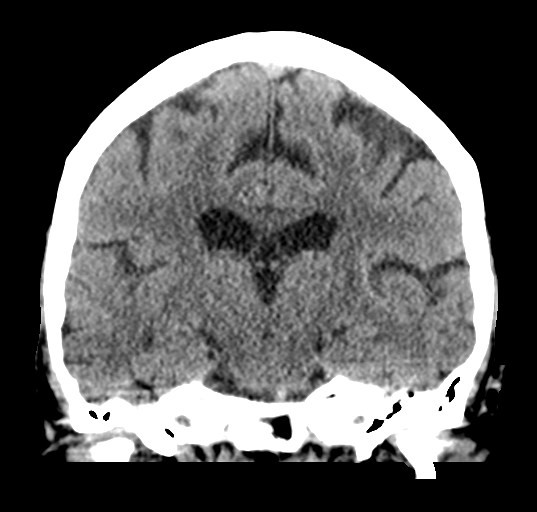
[im 37/66  brain]
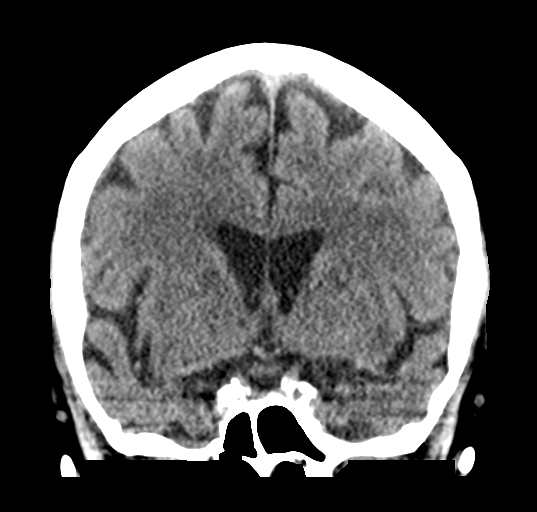

[Series 5: sagittal soft tissue · sagittal · 0.31mm/px · 3 of 55 slices shown]
[im 19/55  brain]
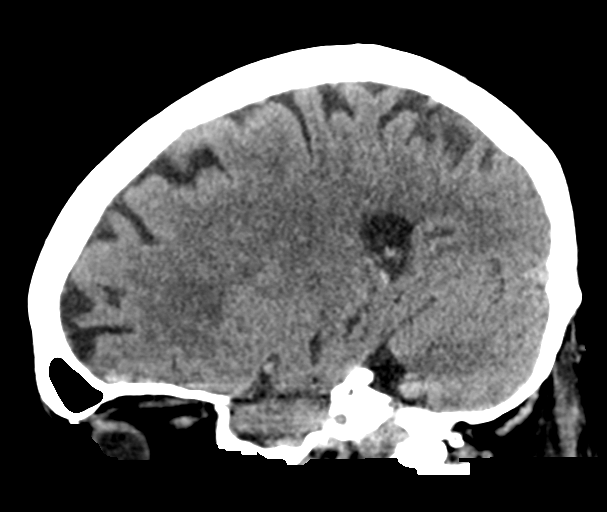
[im 28/55  brain]
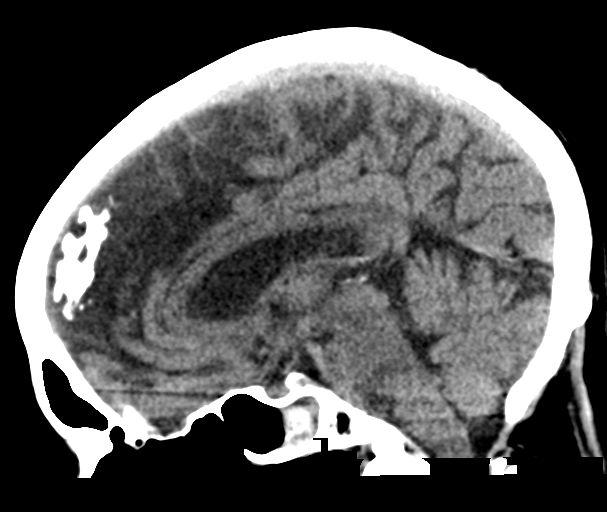
[im 37/55  brain]
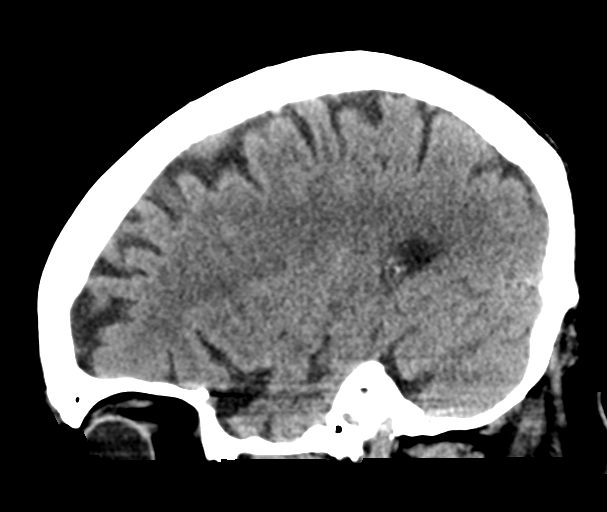

[16 of 47 positions shown; findings below may reference images not displayed]

FINDINGS: Brain: Mild cerebral atrophy. Patchy and confluent areas of
decreased attenuation are noted throughout the deep and
periventricular white matter of the cerebral hemispheres
bilaterally, compatible with chronic microvascular ischemic disease.
No evidence of acute infarction, hemorrhage, hydrocephalus,
extra-axial collection or mass lesion/mass effect.

Vascular: No hyperdense vessel or unexpected calcification.

Skull: Normal. Negative for fracture or focal lesion.

Sinuses/Orbits: Inspissated secretions noted in the left sphenoid
sinus. No acute finding. Trace left mastoid

Other: Trace left mastoid effusion.
IMPRESSION: 1. No acute intracranial abnormalities.
2. Mild cerebral atrophy with extensive chronic microvascular
ischemic changes in the cerebral white matter, as above.
3. Trace left mastoid effusion.

## 2023-02-18 ENCOUNTER — Inpatient Hospital Stay
Admission: EM | Admit: 2023-02-18 | Discharge: 2023-03-23 | DRG: 871 | Disposition: E | Payer: Medicare HMO | Attending: Internal Medicine | Admitting: Internal Medicine

## 2023-02-18 ENCOUNTER — Encounter: Payer: Self-pay | Admitting: Emergency Medicine

## 2023-02-18 ENCOUNTER — Emergency Department: Payer: Medicare HMO

## 2023-02-18 DIAGNOSIS — Z853 Personal history of malignant neoplasm of breast: Secondary | ICD-10-CM

## 2023-02-18 DIAGNOSIS — Z7189 Other specified counseling: Secondary | ICD-10-CM | POA: Diagnosis not present

## 2023-02-18 DIAGNOSIS — E785 Hyperlipidemia, unspecified: Secondary | ICD-10-CM | POA: Diagnosis present

## 2023-02-18 DIAGNOSIS — A419 Sepsis, unspecified organism: Principal | ICD-10-CM | POA: Diagnosis present

## 2023-02-18 DIAGNOSIS — I110 Hypertensive heart disease with heart failure: Secondary | ICD-10-CM | POA: Diagnosis present

## 2023-02-18 DIAGNOSIS — Z8249 Family history of ischemic heart disease and other diseases of the circulatory system: Secondary | ICD-10-CM

## 2023-02-18 DIAGNOSIS — Z823 Family history of stroke: Secondary | ICD-10-CM

## 2023-02-18 DIAGNOSIS — C799 Secondary malignant neoplasm of unspecified site: Secondary | ICD-10-CM | POA: Diagnosis present

## 2023-02-18 DIAGNOSIS — J9601 Acute respiratory failure with hypoxia: Secondary | ICD-10-CM | POA: Diagnosis present

## 2023-02-18 DIAGNOSIS — Z79899 Other long term (current) drug therapy: Secondary | ICD-10-CM | POA: Diagnosis not present

## 2023-02-18 DIAGNOSIS — R54 Age-related physical debility: Secondary | ICD-10-CM | POA: Diagnosis present

## 2023-02-18 DIAGNOSIS — Z515 Encounter for palliative care: Secondary | ICD-10-CM

## 2023-02-18 DIAGNOSIS — I959 Hypotension, unspecified: Secondary | ICD-10-CM | POA: Diagnosis present

## 2023-02-18 DIAGNOSIS — Y95 Nosocomial condition: Secondary | ICD-10-CM | POA: Diagnosis present

## 2023-02-18 DIAGNOSIS — Z883 Allergy status to other anti-infective agents status: Secondary | ICD-10-CM | POA: Diagnosis not present

## 2023-02-18 DIAGNOSIS — I482 Chronic atrial fibrillation, unspecified: Secondary | ICD-10-CM | POA: Diagnosis not present

## 2023-02-18 DIAGNOSIS — Z7901 Long term (current) use of anticoagulants: Secondary | ICD-10-CM | POA: Diagnosis not present

## 2023-02-18 DIAGNOSIS — R0902 Hypoxemia: Secondary | ICD-10-CM

## 2023-02-18 DIAGNOSIS — C3411 Malignant neoplasm of upper lobe, right bronchus or lung: Secondary | ICD-10-CM | POA: Diagnosis present

## 2023-02-18 DIAGNOSIS — E876 Hypokalemia: Secondary | ICD-10-CM | POA: Diagnosis present

## 2023-02-18 DIAGNOSIS — J918 Pleural effusion in other conditions classified elsewhere: Secondary | ICD-10-CM | POA: Diagnosis present

## 2023-02-18 DIAGNOSIS — I2510 Atherosclerotic heart disease of native coronary artery without angina pectoris: Secondary | ICD-10-CM | POA: Diagnosis present

## 2023-02-18 DIAGNOSIS — Z66 Do not resuscitate: Secondary | ICD-10-CM | POA: Diagnosis present

## 2023-02-18 DIAGNOSIS — I4821 Permanent atrial fibrillation: Secondary | ICD-10-CM | POA: Diagnosis present

## 2023-02-18 DIAGNOSIS — Z888 Allergy status to other drugs, medicaments and biological substances status: Secondary | ICD-10-CM

## 2023-02-18 DIAGNOSIS — R6520 Severe sepsis without septic shock: Secondary | ICD-10-CM | POA: Diagnosis present

## 2023-02-18 DIAGNOSIS — I5043 Acute on chronic combined systolic (congestive) and diastolic (congestive) heart failure: Secondary | ICD-10-CM | POA: Diagnosis present

## 2023-02-18 DIAGNOSIS — J189 Pneumonia, unspecified organism: Secondary | ICD-10-CM | POA: Diagnosis present

## 2023-02-18 DIAGNOSIS — Z1152 Encounter for screening for COVID-19: Secondary | ICD-10-CM | POA: Diagnosis not present

## 2023-02-18 DIAGNOSIS — I10 Essential (primary) hypertension: Secondary | ICD-10-CM | POA: Diagnosis present

## 2023-02-18 DIAGNOSIS — J810 Acute pulmonary edema: Secondary | ICD-10-CM

## 2023-02-18 DIAGNOSIS — I5031 Acute diastolic (congestive) heart failure: Secondary | ICD-10-CM | POA: Diagnosis present

## 2023-02-18 DIAGNOSIS — C3490 Malignant neoplasm of unspecified part of unspecified bronchus or lung: Secondary | ICD-10-CM | POA: Diagnosis present

## 2023-02-18 DIAGNOSIS — I5021 Acute systolic (congestive) heart failure: Secondary | ICD-10-CM | POA: Diagnosis not present

## 2023-02-18 LAB — COMPREHENSIVE METABOLIC PANEL
ALT: 15 U/L (ref 0–44)
AST: 31 U/L (ref 15–41)
Albumin: 3.5 g/dL (ref 3.5–5.0)
Alkaline Phosphatase: 71 U/L (ref 38–126)
Anion gap: 10 (ref 5–15)
BUN: 13 mg/dL (ref 8–23)
CO2: 23 mmol/L (ref 22–32)
Calcium: 8.5 mg/dL — ABNORMAL LOW (ref 8.9–10.3)
Chloride: 100 mmol/L (ref 98–111)
Creatinine, Ser: 0.81 mg/dL (ref 0.44–1.00)
GFR, Estimated: >60 mL/min (ref >60)
Glucose, Bld: 172 mg/dL — ABNORMAL HIGH (ref 70–99)
Potassium: 2.9 mmol/L — ABNORMAL LOW (ref 3.5–5.1)
Sodium: 133 mmol/L — ABNORMAL LOW (ref 135–145)
Total Bilirubin: 1.6 mg/dL — ABNORMAL HIGH (ref 0.3–1.2)
Total Protein: 6.8 g/dL (ref 6.5–8.1)

## 2023-02-18 LAB — CBC WITH DIFFERENTIAL/PLATELET
Abs Immature Granulocytes: 0.09 10*3/uL — ABNORMAL HIGH (ref 0.00–0.07)
Basophils Absolute: 0 10*3/uL (ref 0.0–0.1)
Basophils Relative: 0 %
Eosinophils Absolute: 0 10*3/uL (ref 0.0–0.5)
Eosinophils Relative: 0 %
HCT: 34.9 % — ABNORMAL LOW (ref 36.0–46.0)
Hemoglobin: 11.5 g/dL — ABNORMAL LOW (ref 12.0–15.0)
Immature Granulocytes: 1 %
Lymphocytes Relative: 4 %
Lymphs Abs: 0.7 10*3/uL (ref 0.7–4.0)
MCH: 30.4 pg (ref 26.0–34.0)
MCHC: 33 g/dL (ref 30.0–36.0)
MCV: 92.3 fL (ref 80.0–100.0)
Monocytes Absolute: 1 10*3/uL (ref 0.1–1.0)
Monocytes Relative: 6 %
Neutro Abs: 16.3 10*3/uL — ABNORMAL HIGH (ref 1.7–7.7)
Neutrophils Relative %: 89 %
Platelets: 267 10*3/uL (ref 150–400)
RBC: 3.78 MIL/uL — ABNORMAL LOW (ref 3.87–5.11)
RDW: 13.6 % (ref 11.5–15.5)
WBC: 18.1 10*3/uL — ABNORMAL HIGH (ref 4.0–10.5)
nRBC: 0 % (ref 0.0–0.2)

## 2023-02-18 LAB — TROPONIN I (HIGH SENSITIVITY)
Troponin I (High Sensitivity): 15 ng/L (ref <18)
Troponin I (High Sensitivity): 20 ng/L — ABNORMAL HIGH (ref <18)

## 2023-02-18 LAB — BLOOD GAS, VENOUS
Acid-Base Excess: 2.6 mmol/L — ABNORMAL HIGH (ref 0.0–2.0)
Bicarbonate: 27.2 mmol/L (ref 20.0–28.0)
O2 Saturation: 38.6 %
Patient temperature: 37
pCO2, Ven: 41 mmHg — ABNORMAL LOW (ref 44–60)
pH, Ven: 7.43 (ref 7.25–7.43)
pO2, Ven: <31 mmHg — CL (ref 32–45)

## 2023-02-18 LAB — BRAIN NATRIURETIC PEPTIDE: B Natriuretic Peptide: 2070.8 pg/mL — ABNORMAL HIGH (ref 0.0–100.0)

## 2023-02-18 LAB — LACTIC ACID, PLASMA
Lactic Acid, Venous: 2.6 mmol/L (ref 0.5–1.9)
Lactic Acid, Venous: 1.5 mmol/L (ref 0.5–1.9)

## 2023-02-18 LAB — MAGNESIUM: Magnesium: 2 mg/dL (ref 1.7–2.4)

## 2023-02-18 MED ORDER — VANCOMYCIN HCL IN DEXTROSE 1-5 GM/200ML-% IV SOLN: 1000.0000 mg | Freq: Once | INTRAVENOUS | Status: DC

## 2023-02-18 MED ORDER — ACETAMINOPHEN 650 MG RE SUPP: 650.0000 mg | Freq: Four times a day (QID) | RECTAL | Status: DC | PRN

## 2023-02-18 MED ORDER — FUROSEMIDE 10 MG/ML IJ SOLN
40.0000 mg | Freq: Once | INTRAMUSCULAR | Status: AC
  Administered 2023-02-18: 40 mg via INTRAVENOUS
  Filled 2023-02-18: qty 4

## 2023-02-18 MED ORDER — ACETAMINOPHEN 325 MG PO TABS
650.0000 mg | ORAL_TABLET | Freq: Four times a day (QID) | ORAL | Status: DC | PRN
  Administered 2023-02-19: 650 mg via ORAL
  Filled 2023-02-18: qty 2

## 2023-02-18 MED ORDER — POTASSIUM CHLORIDE 10 MEQ/100ML IV SOLN
10.0000 meq | INTRAVENOUS | Status: AC
  Administered 2023-02-18 – 2023-02-19 (×3): 10 meq via INTRAVENOUS
  Filled 2023-02-18 (×3): qty 100

## 2023-02-18 MED ORDER — SODIUM CHLORIDE 0.9 % IV BOLUS
250.0000 mL | Freq: Once | INTRAVENOUS | Status: AC
  Administered 2023-02-18: 250 mL via INTRAVENOUS

## 2023-02-18 MED ORDER — INSULIN ASPART 100 UNIT/ML IJ SOLN
0.0000 [IU] | Freq: Three times a day (TID) | INTRAMUSCULAR | Status: DC
  Administered 2023-02-19 – 2023-02-21 (×2): 1 [IU] via SUBCUTANEOUS
  Filled 2023-02-18 (×2): qty 1

## 2023-02-18 MED ORDER — ONDANSETRON HCL 4 MG/2ML IJ SOLN
4.0000 mg | Freq: Once | INTRAMUSCULAR | Status: AC
  Administered 2023-02-18: 4 mg via INTRAVENOUS
  Filled 2023-02-18: qty 2

## 2023-02-18 MED ORDER — PANTOPRAZOLE SODIUM 40 MG IV SOLR
40.0000 mg | INTRAVENOUS | Status: DC
  Administered 2023-02-19 – 2023-02-21 (×4): 40 mg via INTRAVENOUS
  Filled 2023-02-18 (×4): qty 10

## 2023-02-18 MED ORDER — SODIUM CHLORIDE 0.9 % IV SOLN
1.0000 g | INTRAVENOUS | Status: DC
  Administered 2023-02-18 – 2023-02-20 (×3): 1 g via INTRAVENOUS
  Filled 2023-02-18 (×3): qty 10

## 2023-02-18 MED ORDER — ENOXAPARIN SODIUM 40 MG/0.4ML IJ SOSY
40.0000 mg | PREFILLED_SYRINGE | INTRAMUSCULAR | Status: DC
  Administered 2023-02-19: 40 mg via SUBCUTANEOUS
  Filled 2023-02-18: qty 0.4

## 2023-02-18 MED ORDER — INSULIN ASPART 100 UNIT/ML IJ SOLN: 0.0000 [IU] | Freq: Every day | INTRAMUSCULAR | Status: DC

## 2023-02-18 MED ORDER — SODIUM CHLORIDE 0.9 % IV SOLN
2.0000 g | Freq: Once | INTRAVENOUS | Status: DC
  Filled 2023-02-18: qty 12.5

## 2023-02-18 MED ORDER — VANCOMYCIN HCL 1500 MG/300ML IV SOLN
1500.0000 mg | Freq: Once | INTRAVENOUS | Status: AC
  Administered 2023-02-18: 1500 mg via INTRAVENOUS
  Filled 2023-02-18: qty 300

## 2023-02-18 MED ORDER — SODIUM CHLORIDE 0.9 % IV SOLN
500.0000 mg | INTRAVENOUS | Status: AC
  Administered 2023-02-18 – 2023-02-20 (×3): 500 mg via INTRAVENOUS
  Filled 2023-02-18 (×3): qty 5

## 2023-02-18 MED ORDER — SODIUM CHLORIDE 0.9% FLUSH
3.0000 mL | Freq: Two times a day (BID) | INTRAVENOUS | Status: DC
  Administered 2023-02-19 – 2023-02-21 (×6): 3 mL via INTRAVENOUS

## 2023-02-18 NOTE — Assessment & Plan Note (Addendum)
Bilateral pleural effusions.  S/p thoracentesis by pulmonary.  Preliminary pleural fluid cultures negative.  Cytology pending. Remained on 100% FiO2 and 60 L of oxygen, remained hypoxic. Patient is DNR and DNI.  Palliative care was consulted, if she continued to deteriorate then need to proceed with comfort care. -Continue with heated high flow -Continue with supportive care -Palliative care meeting later today

## 2023-02-18 NOTE — ED Notes (Signed)
Pt provided 2 warm blankets for warm and call bell within reach. Pts family member and pt educated on its proper use. Both side rails raised for pts safety. MD made aware of O2 saturation. O2 increased to 6L [place] per MD instruction. Updated vitals signs documented in pts chart at this time.

## 2023-02-18 NOTE — Sepsis Progress Note (Signed)
Following for sepsis monitoring ?

## 2023-02-18 NOTE — ED Notes (Signed)
Pacemaker Medtronic interrogated @this  time

## 2023-02-18 NOTE — ED Provider Notes (Signed)
[facility] Provider Note    Event Date/Time   First MD Initiated Contact with Patient 02/18/23 1956     (approximate)   History   Respiratory Distress   HPI  Daisy Parks is a 89 y.o. female past medical history significant for stage IV lung adenocarcinoma, history of breast cancer, atrial fibrillation on Eliquis, HFrEF, presents to the emergency department shortness of breath.  Shortness of breath that became significantly worsening earlier today.  When EMS arrived patient was hypoxic to 78% and she was placed on 4 L nasal cannula, continued to be hypoxic in the 80s so she was placed on CPAP with improvement to 95%.  Patient endorses progressively worsening shortness of breath over the past couple of days.  Does endorse cough but no sputum production.  Intermittent episode of chest pain.  Denies any chest pain at this time.  Endorses compliance with her Eliquis and has not missed any doses.  Not currently undergoing treatment for adenocarcinoma of the lung that is being worked up with oncology at [facility].     Physical Exam   Triage Vital Signs: ED Triage Vitals [02/18/23 1959]  Enc Vitals Group     BP (!) 125/54     Pulse Rate 75     Resp (!) 9     Temp      Temp src      SpO2 (!) 85 %     Weight      Height      Head Circumference      Peak Flow      Pain Score      Pain Loc      Pain Edu?      Excl. in GC?     Most recent vital signs: Vitals:   02/18/23 1959 02/18/23 2009  BP: (!) 125/54   Pulse: 75   Resp: (!) 9   Temp:  98.8 F (37.1 C)  SpO2: (!) 85% (!) 87%    Physical Exam Constitutional:      General: She is in acute distress.     Appearance: She is well-developed. She is ill-appearing.  HENT:     Head: Atraumatic.  Eyes:     Conjunctiva/sclera: Conjunctivae normal.  Cardiovascular:     Rate and Rhythm: Regular rhythm.  Pulmonary:     Effort: Respiratory distress present.     Comments: Hypoxic in the 80s, crackles to  bilateral lower lung fields.  No wheezing.  Tachypneic. Abdominal:     General: There is no distension.  Musculoskeletal:        General: Normal range of motion.     Cervical back: Normal range of motion.     Right lower leg: Edema present.     Left lower leg: Edema present.     Comments: 1+ pitting edema to bilateral lower extremities with no unilateral leg swelling.  Skin:    General: Skin is warm.     Capillary Refill: Capillary refill takes less than 2 seconds.  Neurological:     Mental Status: She is alert. Mental status is at baseline.  Psychiatric:        Mood and Affect: Mood normal.     IMPRESSION / MDM / ASSESSMENT AND PLAN / ED COURSE  I reviewed the triage vital signs and the nursing notes.  Differential diagnosis including heart failure exacerbation, ACS, pulmonary embolism, pneumonia  On chart review patient has a history of lung cancer.  Followed by  [facility].  Does have a Medtronic pacemaker.  EKG  I, Corena Herter, the attending physician, personally viewed and interpreted this ECG.  Paced rhythm that is negative for Sgarbossa's criteria.  No significant change when compared to prior EKG.  Heart rate is 70.  QRS 149.  QTc 490.  Intermittent PVCs while on cardiac telemetry.  RADIOLOGY I independently reviewed imaging, my interpretation of imaging: Chest x-ray with significant pulmonary edema versus multifocal pneumonia.  Significant change when compared to prior chest x-ray in her chart.  Read as edema versus pneumonia.  LABS (all labs ordered are listed, but only abnormal results are displayed) Labs interpreted as -    Labs Reviewed  COMPREHENSIVE METABOLIC PANEL - Abnormal; Notable for the following components:      Result Value   Sodium 133 (*)    Potassium 2.9 (*)    Glucose, Bld 172 (*)    Calcium 8.5 (*)    Total Bilirubin 1.6 (*)    All other components within normal limits  LACTIC ACID, PLASMA - Abnormal; Notable for the following components:    Lactic Acid, Venous 2.6 (*)    All other components within normal limits  CBC WITH DIFFERENTIAL/PLATELET - Abnormal; Notable for the following components:   WBC 18.1 (*)    RBC 3.78 (*)    Hemoglobin 11.5 (*)    HCT 34.9 (*)    Neutro Abs 16.3 (*)    Abs Immature Granulocytes 0.09 (*)    All other components within normal limits  BLOOD GAS, VENOUS - Abnormal; Notable for the following components:   pCO2, Ven 41 (*)    pO2, Ven <31 (*)    Acid-Base Excess 2.6 (*)    All other components within normal limits  BRAIN NATRIURETIC PEPTIDE - Abnormal; Notable for the following components:   B Natriuretic Peptide 2,070.8 (*)    All other components within normal limits  CULTURE, BLOOD (ROUTINE X 2) W REFLEX TO ID PANEL  CULTURE, BLOOD (ROUTINE X 2) W REFLEX TO ID PANEL  SARS CORONAVIRUS 2 BY RT PCR  RESPIRATORY PANEL BY PCR  MAGNESIUM  LACTIC ACID, PLASMA  POTASSIUM  TROPONIN I (HIGH SENSITIVITY)  TROPONIN I (HIGH SENSITIVITY)     MDM    On arrival patient continued to be hypoxic with new O2 requirement.  Placed on BiPAP given her ongoing hypoxia.  Chest x-ray concern for possible pneumonia.  Blood cultures added on and will add on a lactic acid.  Does have significant leukocytosis.  Elevated initial lactic acid.  Felt that 30 cc/kg of IV fluids would be detrimental to the patient given her signs of heart failure, given a 250 bolus and will reevaluate.  Started on broad-spectrum antibiotics to cover for hospital-acquired pneumonia given her recent admission as an outpatient in outside hospital started on vancomycin and cefepime.  Given IV Lasix given concern for heart failure exacerbation given significantly elevated BNP in the 2000's.  Patient does state that she would want intubation if necessary.  Consulted hospitalist for admission for acute hypoxic respiratory failure.   PROCEDURES:  Critical Care performed: yes  .Critical Care  Performed by: Corena Herter,  MD Authorized by: Corena Herter, MD   Critical care provider statement:    Critical care time (minutes):  45   Critical care time was exclusive of:  Separately billable procedures and treating other patients   Critical care was necessary to treat or prevent imminent or life-threatening deterioration of the following conditions:  Respiratory  failure   Critical care was time spent personally by me on the following activities:  Development of treatment plan with patient or surrogate, discussions with consultants, evaluation of patient's response to treatment, examination of patient, ordering and review of laboratory studies, ordering and review of radiographic studies, ordering and performing treatments and interventions, pulse oximetry, re-evaluation of patient's condition and review of old charts   Patient's presentation is most consistent with acute presentation with potential threat to life or bodily function.   MEDICATIONS ORDERED IN ED: Medications  furosemide (LASIX) injection 40 mg (has no administration in time range)  vancomycin (VANCOREADY) IVPB 1500 mg/300 mL (has no administration in time range)  potassium chloride 10 mEq in 100 mL IVPB (has no administration in time range)  sodium chloride 0.9 % bolus 250 mL (has no administration in time range)  cefTRIAXone (ROCEPHIN) 1 g in sodium chloride 0.9 % 100 mL IVPB (1 g Intravenous New Bag/Given 02/18/23 2223)  azithromycin (ZITHROMAX) 500 mg in sodium chloride 0.9 % 250 mL IVPB (has no administration in time range)    FINAL CLINICAL IMPRESSION(S) / ED DIAGNOSES   Final diagnoses:  Hypoxia  HCAP (healthcare-associated pneumonia)  Acute pulmonary edema (HCC)     Rx / DC Orders   ED Discharge Orders     None        Note:  This document was prepared using Dragon voice recognition software and may include unintentional dictation errors.   Corena Herter, MD 02/18/23 2228

## 2023-02-18 NOTE — Consult Note (Signed)
PHARMACY -  BRIEF ANTIBIOTIC NOTE   Pharmacy has received consult(s) for vancomycin and cefepime from an ED provider.  The patient's profile has been reviewed for ht/wt/allergies/indication/available labs.    One time order(s) placed for  Vancomycin 1500 mg Cefepime 2 gram  Further antibiotics/pharmacy consults should be ordered by admitting physician if indicated.                       Thank you, Sharen Hones, PharmD, BCPS Clinical Pharmacist   02/18/2023  9:21 PM

## 2023-02-18 NOTE — Progress Notes (Signed)
CODE SEPSIS - PHARMACY COMMUNICATION  **Broad Spectrum Antibiotics should be administered within 1 hour of Sepsis diagnosis**  Time Code Sepsis Called/Page Received: 2126  Antibiotics Ordered: Azithromycin, Ceftriaxone, Vancomycin  Time of 1st antibiotic administration: 2223  Otelia Sergeant, PharmD, [facility] 02/18/2023 11:41 PM

## 2023-02-18 NOTE — ED Triage Notes (Signed)
Pt from home, chf, stage 4 lung ca. RA 78% not on oxygen. EMS placed on cpap.

## 2023-02-18 NOTE — H&P (Addendum)
History and Physical    Patient: Daisy Parks BJY:[id number] DOB: 1932-07-14 DOA: 02/18/2023 DOS: the patient was seen and examined on 02/18/2023 PCP: Kurtis Bushman, MD  Patient coming from: Home  Chief Complaint:  Chief Complaint  Patient presents with   Respiratory Distress   HPI: Daisy Parks is a 89 y.o. female with medical history significant of lung cancer that is apparently metastatic and patient follows up with oncology in [facility].  However at this time no treatments have been started.  I attempted to reach patient's son Daisy Parks on [phone], I left a voicemail with callback instructions.  History from the patient was rather difficult as the patient does seem to have a weak voice and patient is on a BiPAP.  Therefore there may be some erroneous interpretation and communication in this HPI.  It seems that the patient was in her usual state of health till about 3 days ago when she was able to ambulate but slowly without any shortness of breath.  For the past 2 days patient has had a progressive syndrome of cough which is dry associated with sensation of shortness of breath on and off.  Patient has had chills however no fever is confirmed.  Patient has had on and off chest pain anterior substernal.  Patient is unable to tell me any aggravating or relieving factors for chest pain.  However is currently chest pain-free.  There is no leg swelling reported.  No palpitations or loss of consciousness.  Patient shortness of breath got worse today prompting call to EMS.  Patient was found to be 78% SpO2 on room air on arrival by EMS.  Patient has been stabilized on a BiPAP at this time and brought to [facility] ER where patient has received IV Lasix IV vancomycin and cefepime.  Patient reports feeling fairly good on BiPAP.  And at this time is not feeling short of breath and no chest pain.  Medical evaluation is sought  There is no report of patient having vomiting diarrhea rash in skin or  similar prior episodes  Review of Systems: As mentioned in the history of present illness. All other systems reviewed and are negative. 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 She has never used smokeless tobacco. She reports that she does not drink alcohol and does not use drugs.  Allergies  Allergen Reactions   Diltiazem     Other reaction(s): Dizziness dizzy dizzy    Hydrochlorothiazide     Other reaction(s): Dizziness   Chlorthalidone Other (See Comments)    Other reaction(s): Other (See Comments) Exacerbates afib Exacerbates afib    Pravastatin     Other reaction(s): Other (See Comments), Other (See Comments) Other reaction(s): MUSCLE PAIN Muscle pain  Other reaction(s): MUSCLE PAIN Muscle pain     Venlafaxine     Other reaction(s): Other (See Comments) Light headed Light headed    Doxazosin Palpitations    and Hypotension and Hypotension    Enalapril Itching  Other reaction(s): Other (See Comments), Other (See Comments) Cough  Cough     Losartan Palpitations   Mometasone Rash    Family History  Problem Relation Age of Onset   CVA Mother    CAD Father    CVA Father    Heart attack Sister    Hypertension Brother     Prior to Admission medications   Medication Sig Start Date End Date Taking? Authorizing Provider  prochlorperazine (COMPAZINE) 5 MG tablet Take 5 mg by mouth every 8 (eight) hours as needed for nausea. 02/04/23  Yes [provider]  amiodarone (PACERONE) 200 MG tablet Take 1 tablet (200 mg total) by mouth daily. Continue until  8/15 and then start taking once daily. 08/06/22   Pennie Banter, DO  apixaban (ELIQUIS) 5 MG TABS tablet Take 5 mg by mouth 2 (two) times daily. 04/22/22   [provider]  esomeprazole (NEXIUM) 40 MG capsule Take 40 mg by mouth daily. 11/18/22   [provider]  feeding supplement (ENSURE ENLIVE / ENSURE PLUS) LIQD Take 237 mLs by mouth 2 (two) times daily between meals. 08/06/22   Pennie Banter, DO  furosemide (LASIX) 20 MG tablet Take 1 tablet (20 mg total) by mouth daily as needed (swelling or weight gain). 08/06/22   Pennie Banter, DO  hydrOXYzine (ATARAX) 10 MG tablet Take 1 tablet (10 mg total) by mouth 3 (three) times daily as needed for anxiety. 08/06/22   Esaw Grandchild A, DO  metoprolol succinate (TOPROL-XL) 25 MG 24 hr tablet Take 0.5 tablets (12.5 mg total) by mouth 2 (two) times daily. 08/06/22   Pennie Banter, DO  Multiple Vitamin (MULTIVITAMIN WITH MINERALS) TABS tablet Take 1 tablet by mouth daily. 08/07/22   Esaw Grandchild A, DO  sertraline (ZOLOFT) 50 MG tablet Take by mouth. 11/03/22 11/03/23  [provider]  spironolactone (ALDACTONE) 25 MG tablet Take 0.5 tablets (12.5 mg total) by mouth daily. 08/06/22   Pennie Banter, DO    Physical Exam: Vitals:   02/18/23 1959 02/18/23 2009  BP: (!) 125/54   Pulse: 75   Resp: (!) 9   Temp:  98.8 F (37.1 C)  TempSrc:  Oral  SpO2: (!) 85% (!) 87%   General: Patient is on BiPAP 10 over 5 cm of water 40% FiO2.  Patient SpO2 ranging between 91 and 95%.  Patient respiratory rate between 18 and 28.  However does not appear to be distressed.  Patient seems coherent.  However it is hard to understand patient because of her weak voice and use of BiPAP at this time Respiratory exam: Bilateral air entry vesicular Cardiovascular exam S1-S2 normal no jugular venous distention no edema of the legs Abdomen soft nontender Extremities warm without edema no focal motor deficit Patient is alert awake  actively engaging in conversation no distress Data Reviewed:  Labs on Admission:  Results for orders placed or performed during the hospital encounter of 02/18/23 (from the past 24 hour(s))  Comprehensive metabolic panel     Status: Abnormal   Collection Time: 02/18/23  7:59 PM  Result Value Ref Range   Sodium 133 (L) 135 - 145 mmol/L   Potassium 2.9 (L) 3.5 - 5.1 mmol/L   Chloride 100 98 - 111 mmol/L   CO2 23 22 - 32 mmol/L   Glucose, Bld 172 (H) 70 - 99 mg/dL   BUN 13 8 - 23 mg/dL   Creatinine, Ser 1.61 0.44 - 1.00 mg/dL  Calcium 8.5 (L) 8.9 - 10.3 mg/dL   Total Protein 6.8 6.5 - 8.1 g/dL   Albumin 3.5 3.5 - 5.0 g/dL   AST 31 15 - 41 U/L   ALT 15 0 - 44 U/L   Alkaline Phosphatase 71 38 - 126 U/L   Total Bilirubin 1.6 (H) 0.3 - 1.2 mg/dL   GFR, Estimated >16 >10 mL/min   Anion gap 10 5 - 15  Lactic acid, plasma     Status: Abnormal   Collection Time: 02/18/23  7:59 PM  Result Value Ref Range   Lactic Acid, Venous 2.6 (HH) 0.5 - 1.9 mmol/L  Troponin I (High Sensitivity)     Status: None   Collection Time: 02/18/23  7:59 PM  Result Value Ref Range   Troponin I (High Sensitivity) 15 <18 ng/L  CBC with Differential     Status: Abnormal   Collection Time: 02/18/23  7:59 PM  Result Value Ref Range   WBC 18.1 (H) 4.0 - 10.5 K/uL   RBC 3.78 (L) 3.87 - 5.11 MIL/uL   Hemoglobin 11.5 (L) 12.0 - 15.0 g/dL   HCT 96.0 (L) 45.4 - 09.8 %   MCV 92.3 80.0 - 100.0 fL   MCH 30.4 26.0 - 34.0 pg   MCHC 33.0 30.0 - 36.0 g/dL   RDW 11.9 14.7 - 82.9 %   Platelets 267 150 - 400 K/uL   nRBC 0.0 0.0 - 0.2 %   Neutrophils Relative % 89 %   Neutro Abs 16.3 (H) 1.7 - 7.7 K/uL   Lymphocytes Relative 4 %   Lymphs Abs 0.7 0.7 - 4.0 K/uL   Monocytes Relative 6 %   Monocytes Absolute 1.0 0.1 - 1.0 K/uL   Eosinophils Relative 0 %   Eosinophils Absolute 0.0 0.0 - 0.5 K/uL   Basophils Relative 0 %   Basophils Absolute 0.0 0.0 - 0.1 K/uL   Immature Granulocytes 1 %   Abs Immature Granulocytes 0.09  (H) 0.00 - 0.07 K/uL  Brain natriuretic peptide     Status: Abnormal   Collection Time: 02/18/23  7:59 PM  Result Value Ref Range   B Natriuretic Peptide 2,070.8 (H) 0.0 - 100.0 pg/mL  Magnesium     Status: None   Collection Time: 02/18/23  7:59 PM  Result Value Ref Range   Magnesium 2.0 1.7 - 2.4 mg/dL  Blood gas, venous     Status: Abnormal   Collection Time: 02/18/23  8:02 PM  Result Value Ref Range   pH, Ven 7.43 7.25 - 7.43   pCO2, Ven 41 (L) 44 - 60 mmHg   pO2, Ven <31 (LL) 32 - 45 mmHg   Bicarbonate 27.2 20.0 - 28.0 mmol/L   Acid-Base Excess 2.6 (H) 0.0 - 2.0 mmol/L   O2 Saturation 38.6 %   Patient temperature 37.0    Collection site VEIN    Basic Metabolic Panel: Recent Labs  Lab 02/18/23 1959  NA 133*  K 2.9*  CL 100  CO2 23  GLUCOSE 172*  BUN 13  CREATININE 0.81  CALCIUM 8.5*  MG 2.0   Liver Function Tests: Recent Labs  Lab 02/18/23 1959  AST 31  ALT 15  ALKPHOS 71  BILITOT 1.6*  PROT 6.8  ALBUMIN 3.5   No results for input(s): "LIPASE", "AMYLASE" in the last 168 hours. No results for input(s): "AMMONIA" in the last 168 hours. CBC: Recent Labs  Lab 02/18/23 1959  WBC 18.1*  NEUTROABS 16.3*  HGB 11.5*  HCT 34.9*  MCV 92.3  PLT 267   Cardiac Enzymes: Recent Labs  Lab 02/18/23 1959  TROPONINIHS 15    BNP (last 3 results) No results for input(s): "PROBNP" in the last 8760 hours. CBG: No results for input(s): "GLUCAP" in the last 168 hours.  Radiological Exams on Admission:  DG Chest 1 View  Result Date: 02/18/2023 CLINICAL DATA:  Hypoxia EXAM: CHEST  1 VIEW COMPARISON:  08/05/2022 FINDINGS: Transverse diameter of heart is increased. Central pulmonary vessels are prominent. Increased interstitial and alveolar markings are seen in both parahilar regions, more so on the right side. Increased interstitial and alveolar markings are seen in the lower lung fields, more so on the right side. There is blunting of lateral CP angles. There is no  pneumothorax. Pacemaker battery is seen in the right infraclavicular region. Surgical clips are seen in left chest wall. IMPRESSION: Cardiomegaly. Extensive new interstitial and alveolar densities are seen in the parahilar regions and lower lung fields, more so on the right side. Findings suggest possible pulmonary edema or extensive bilateral multifocal pneumonia. Electronically Signed   By: Ernie Avena M.D.   On: 02/18/2023 21:08    EKG: Independently reviewed. Artifact in V5. NSR   Assessment and Plan: Hypokalemia Incidentally found on lab testing today.  Patient did not report any vomiting or diarrhea.  However on care everywhere I see patient has telephone encounters for severe nausea and has been prescribed Compazine.  This is being repleted IV.  We will monitor basic metabolic panel at midnight tonight.  Proceed accordingly  Acute respiratory failure with hypoxia (HCC) Given that the patient has prior history of lung cancer, and given that we do not have recent chest imaging on the system, hard to differentiate lung cancer related complications from community-acquired pneumonia.  Therefore I will get a routine chest CT.  However our working diagnosis is community-acquired pneumonia with severe hypoxia and respiratory failure requiring noninvasive ventilation at this time.  Patient appears comfortable on current BiPAP settings of 40% FiO2 and 10 over 5 cm of water of pressures.  Her respiratory rates are varying between 18 to 25/min during routine exam maneuvers.  Follow-up blood cultures as ordered.  Follow-up COVID-19 testing as well as respiratory pathogen panel.  Empiric treatment started with vancomycin and cefepime in the ER.  Patient is not looking toxic at this time.  It will be continued with ceftriaxone and azithromycin.  Blood gas not concerning for hypercapnia.  Wean ventilator as tolerated  Minimal lactic acid elevation in the setting of severe hypoxemia is nonspecific and will  be trended.  Patient has a prior diagnosis of CHF, however does not appear to be significantly fluid overloaded at this time.  Patient has received single dose of Lasix, we will monitor clinically for need for further Lasix at this time.   Medication reconciliation is pending home med collection at this time, it will be revisited by the end of my shift.  I intend to hold off on patient's amiodarone as well as apixaban given lung complications and vital instability.    Advance Care Planning:   Code Status: Full Code per patient.  Consults: none at this time.  Family Communication: awaiting call back from son. He was at bedside earlier and well aware of current status of patient per my understanding.  Severity of Illness: The appropriate patient status for this patient is INPATIENT. Inpatient status is judged to be reasonable and necessary in order to provide the required intensity  of service to ensure the patient's safety. The patient's presenting symptoms, physical exam findings, and initial radiographic and laboratory data in the context of their chronic comorbidities is felt to place them at high risk for further clinical deterioration. Furthermore, it is not anticipated that the patient will be medically stable for discharge from the hospital within 2 midnights of admission.   * I certify that at the point of admission it is my clinical judgment that the patient will require inpatient hospital care spanning beyond 2 midnights from the point of admission due to high intensity of service, high risk for further deterioration and high frequency of surveillance required.*  Author: Nolberto Hanlon, MD 02/18/2023 10:13 PM  For on call review [email or web address].

## 2023-02-18 NOTE — Assessment & Plan Note (Addendum)
Resolved with repletion. -Continue to monitor as she is being diuresed

## 2023-02-19 ENCOUNTER — Encounter: Payer: Self-pay | Admitting: Internal Medicine

## 2023-02-19 ENCOUNTER — Inpatient Hospital Stay: Payer: Medicare HMO

## 2023-02-19 ENCOUNTER — Other Ambulatory Visit: Payer: Self-pay

## 2023-02-19 DIAGNOSIS — J9601 Acute respiratory failure with hypoxia: Secondary | ICD-10-CM | POA: Diagnosis not present

## 2023-02-19 LAB — BLOOD CULTURE ID PANEL (REFLEXED) - BCID2

## 2023-02-19 LAB — CBC
HCT: 31.9 % — ABNORMAL LOW (ref 36.0–46.0)
Hemoglobin: 10.4 g/dL — ABNORMAL LOW (ref 12.0–15.0)
MCH: 30.4 pg (ref 26.0–34.0)
MCHC: 32.6 g/dL (ref 30.0–36.0)
MCV: 93.3 fL (ref 80.0–100.0)
Platelets: 256 10*3/uL (ref 150–400)
RBC: 3.42 MIL/uL — ABNORMAL LOW (ref 3.87–5.11)
RDW: 13.7 % (ref 11.5–15.5)
WBC: 21.1 10*3/uL — ABNORMAL HIGH (ref 4.0–10.5)
nRBC: 0 % (ref 0.0–0.2)

## 2023-02-19 LAB — PROCALCITONIN: Procalcitonin: 0.31 ng/mL

## 2023-02-19 LAB — BASIC METABOLIC PANEL
Anion gap: 10 (ref 5–15)
BUN: 15 mg/dL (ref 8–23)
CO2: 22 mmol/L (ref 22–32)
Calcium: 8.1 mg/dL — ABNORMAL LOW (ref 8.9–10.3)
Chloride: 104 mmol/L (ref 98–111)
Creatinine, Ser: 0.83 mg/dL (ref 0.44–1.00)
GFR, Estimated: >60 mL/min (ref >60)
Glucose, Bld: 127 mg/dL — ABNORMAL HIGH (ref 70–99)
Potassium: 3.2 mmol/L — ABNORMAL LOW (ref 3.5–5.1)
Sodium: 136 mmol/L (ref 135–145)

## 2023-02-19 LAB — RESPIRATORY PANEL BY PCR

## 2023-02-19 LAB — CULTURE, BLOOD (ROUTINE X 2)

## 2023-02-19 LAB — CBG MONITORING, ED
Glucose-Capillary: 143 mg/dL — ABNORMAL HIGH (ref 70–99)
Glucose-Capillary: 113 mg/dL — ABNORMAL HIGH (ref 70–99)

## 2023-02-19 LAB — GLUCOSE, CAPILLARY
Glucose-Capillary: 114 mg/dL — ABNORMAL HIGH (ref 70–99)
Glucose-Capillary: 142 mg/dL — ABNORMAL HIGH (ref 70–99)
Glucose-Capillary: 171 mg/dL — ABNORMAL HIGH (ref 70–99)

## 2023-02-19 LAB — PHOSPHORUS: Phosphorus: 3.7 mg/dL (ref 2.5–4.6)

## 2023-02-19 LAB — MRSA NEXT GEN BY PCR, NASAL: MRSA by PCR Next Gen: NOT DETECTED

## 2023-02-19 LAB — HEMOGLOBIN A1C
Hgb A1c MFr Bld: 5.8 % — ABNORMAL HIGH (ref 4.8–5.6)
Mean Plasma Glucose: 120 mg/dL

## 2023-02-19 LAB — SARS CORONAVIRUS 2 BY RT PCR: SARS Coronavirus 2 by RT PCR: NEGATIVE

## 2023-02-19 LAB — PROTIME-INR
INR: 2.1 — ABNORMAL HIGH (ref 0.8–1.2)
Prothrombin Time: 24 seconds — ABNORMAL HIGH (ref 11.4–15.2)

## 2023-02-19 LAB — APTT: aPTT: 48 seconds — ABNORMAL HIGH (ref 24–36)

## 2023-02-19 MED ORDER — AMIODARONE HCL 200 MG PO TABS
200.0000 mg | ORAL_TABLET | Freq: Every day | ORAL | Status: DC
  Administered 2023-02-19 – 2023-02-20 (×2): 200 mg via ORAL
  Filled 2023-02-19 (×2): qty 1

## 2023-02-19 MED ORDER — HYDRALAZINE HCL 20 MG/ML IJ SOLN: 10.0000 mg | INTRAMUSCULAR | Status: DC | PRN

## 2023-02-19 MED ORDER — SALINE SPRAY 0.65 % NA SOLN: 1.0000 | NASAL | Status: DC | PRN

## 2023-02-19 MED ORDER — ALUM & MAG HYDROXIDE-SIMETH 200-200-20 MG/5ML PO SUSP: 30.0000 mL | ORAL | Status: DC | PRN

## 2023-02-19 MED ORDER — LORATADINE 10 MG PO TABS: 10.0000 mg | ORAL_TABLET | Freq: Every day | ORAL | Status: DC | PRN

## 2023-02-19 MED ORDER — SPIRONOLACTONE 12.5 MG HALF TABLET
12.5000 mg | ORAL_TABLET | Freq: Every day | ORAL | Status: DC
  Administered 2023-02-20 – 2023-02-21 (×2): 12.5 mg via ORAL
  Filled 2023-02-19 (×4): qty 1

## 2023-02-19 MED ORDER — CHLORHEXIDINE GLUCONATE CLOTH 2 % EX PADS
6.0000 | MEDICATED_PAD | Freq: Every day | CUTANEOUS | Status: DC
  Administered 2023-02-19 – 2023-02-21 (×2): 6 via TOPICAL

## 2023-02-19 MED ORDER — APIXABAN 5 MG PO TABS
5.0000 mg | ORAL_TABLET | Freq: Two times a day (BID) | ORAL | Status: DC
  Administered 2023-02-19 – 2023-02-21 (×4): 5 mg via ORAL
  Filled 2023-02-19 (×4): qty 1

## 2023-02-19 MED ORDER — POLYVINYL ALCOHOL 1.4 % OP SOLN: 1.0000 [drp] | OPHTHALMIC | Status: DC | PRN

## 2023-02-19 MED ORDER — PROCHLORPERAZINE MALEATE 5 MG PO TABS: 5.0000 mg | ORAL_TABLET | Freq: Three times a day (TID) | ORAL | Status: DC | PRN

## 2023-02-19 MED ORDER — SERTRALINE HCL 50 MG PO TABS
50.0000 mg | ORAL_TABLET | Freq: Every day | ORAL | Status: DC
  Administered 2023-02-19 – 2023-02-21 (×3): 50 mg via ORAL
  Filled 2023-02-19 (×3): qty 1

## 2023-02-19 MED ORDER — BLISTEX MEDICATED EX OINT: 1.0000 | TOPICAL_OINTMENT | CUTANEOUS | Status: DC | PRN

## 2023-02-19 MED ORDER — MUSCLE RUB 10-15 % EX CREA: 1.0000 | TOPICAL_CREAM | CUTANEOUS | Status: DC | PRN

## 2023-02-19 MED ORDER — TRAZODONE HCL 50 MG PO TABS
50.0000 mg | ORAL_TABLET | Freq: Every evening | ORAL | Status: DC | PRN
  Administered 2023-02-19: 50 mg via ORAL
  Filled 2023-02-19: qty 1

## 2023-02-19 MED ORDER — PHENOL 1.4 % MT LIQD
1.0000 | OROMUCOSAL | Status: DC | PRN
  Administered 2023-02-21: 1 via OROMUCOSAL
  Filled 2023-02-19: qty 177

## 2023-02-19 MED ORDER — METOPROLOL SUCCINATE ER 25 MG PO TB24
12.5000 mg | ORAL_TABLET | Freq: Two times a day (BID) | ORAL | Status: DC
  Administered 2023-02-19 – 2023-02-21 (×3): 12.5 mg via ORAL
  Filled 2023-02-19 (×6): qty 0.5

## 2023-02-19 MED ORDER — PANTOPRAZOLE SODIUM 40 MG PO TBEC: 40.0000 mg | DELAYED_RELEASE_TABLET | Freq: Every day | ORAL | Status: DC

## 2023-02-19 MED ORDER — GUAIFENESIN 100 MG/5ML PO LIQD
5.0000 mL | ORAL | Status: DC | PRN
  Administered 2023-02-19 – 2023-02-21 (×3): 5 mL via ORAL
  Filled 2023-02-19 (×3): qty 10

## 2023-02-19 MED ORDER — POTASSIUM CHLORIDE CRYS ER 20 MEQ PO TBCR
40.0000 meq | EXTENDED_RELEASE_TABLET | ORAL | Status: AC
  Administered 2023-02-19 (×2): 40 meq via ORAL
  Filled 2023-02-19 (×2): qty 2

## 2023-02-19 MED ORDER — HYDROXYZINE HCL 10 MG PO TABS: 10.0000 mg | ORAL_TABLET | Freq: Three times a day (TID) | ORAL | Status: DC | PRN

## 2023-02-19 MED ORDER — IPRATROPIUM-ALBUTEROL 0.5-2.5 (3) MG/3ML IN SOLN: 3.0000 mL | RESPIRATORY_TRACT | Status: DC | PRN

## 2023-02-19 MED ORDER — SENNOSIDES-DOCUSATE SODIUM 8.6-50 MG PO TABS
1.0000 | ORAL_TABLET | Freq: Every evening | ORAL | Status: DC | PRN
  Administered 2023-02-20: 1 via ORAL
  Filled 2023-02-19: qty 1

## 2023-02-19 MED ORDER — FUROSEMIDE 10 MG/ML IJ SOLN
40.0000 mg | Freq: Two times a day (BID) | INTRAMUSCULAR | Status: DC
  Administered 2023-02-19 – 2023-02-21 (×6): 40 mg via INTRAVENOUS
  Filled 2023-02-19 (×6): qty 4

## 2023-02-19 MED ORDER — IPRATROPIUM-ALBUTEROL 0.5-2.5 (3) MG/3ML IN SOLN
3.0000 mL | Freq: Three times a day (TID) | RESPIRATORY_TRACT | Status: DC
  Administered 2023-02-19 – 2023-02-20 (×4): 3 mL via RESPIRATORY_TRACT
  Filled 2023-02-19 (×6): qty 3

## 2023-02-19 NOTE — ED Notes (Signed)
RT called for assitance for transport to CT

## 2023-02-19 NOTE — Consult Note (Signed)
PHARMACY CONSULT NOTE - FOLLOW UP  Pharmacy Consult for Electrolyte Monitoring and Replacement   Recent Labs: Potassium (mmol/L)  Date Value  02/19/2023 3.2 (L)   Magnesium (mg/dL)  Date Value  16/06/9603 2.0   Calcium (mg/dL)  Date Value  54/05/8118 8.1 (L)   Albumin (g/dL)  Date Value  14/78/2956 3.5   Sodium (mmol/L)  Date Value  02/19/2023 136    Assessment: 89 y.o. female with PMH stage IV lung adenocarcinoma, h/o breast cancer, Afib on Eliquis, HFrEF who presents with SOB and hypoxia requiring CPAP. CXR c/f possible pneumonia.  Goal of Therapy:  K >/= 4.0 and Mg >/= 2.0  Plan:  K 3.2 >> potassium chloride 40 mEq q4H scheduled as ongoing. Will modify to stop after 2 doses. No other electrolyte replacement warranted. Follow-up electrolytes with AM labs.  Will M. Dareen Piano, PharmD PGY-1 Pharmacy Resident 02/19/2023 8:43 AM

## 2023-02-19 NOTE — Progress Notes (Addendum)
PROGRESS NOTE    Daisy Parks  ZOX:[id number] DOB: 11/24/1931 DOA: 02/18/2023 PCP: Kurtis Bushman, MD   Brief Narrative:  89 year old with history of lung cancer with mets follows at [facility] oncology, atrial fibrillation on Eliquis, HTN, HLD comes to the hospital with complaints of progressive dyspnea on exertion and shortness of breath.  Upon admission found to have pneumonia started on IV antibiotics and required BiPAP.   Assessment & Plan:  Principal Problem:   Community acquired pneumonia Active Problems:   Hypokalemia   Acute respiratory failure with hypoxia (HCC)     Acute respiratory failure with hypoxia - On BiPAP 40% FiO2, desaturates down to 82%.  Increased FiO2 setting.  This could be combination of malignancy, fluid overload as evidenced by elevated BNP.  Will check procalcitonin.  Patient has leukocytosis and chest x-ray evidence of multifocal pneumonia.  For now we will continue BiPAP for respiratory support.  If does well we can try and transition her to high flow - Empiric IV Rocephin and azithromycin - IV Lasix 40 mg twice daily  Multifocal community-acquired pneumonia - Patient currently on Rocephin and azithromycin.  Respiratory panel ordered.  Bronchodilators I-S/flutter valve.  Elevated BNP Acute CHF with preserved EF 55%; class IV - Very certainly signs of volume overload.  Will minimize IV fluids at this time.  Give Lasix 40 mg IV twice daily.  Hypokalemia Will replete this aggressively.  Magnesium levels normal.  Check phosphorus.  Right upper lobe well-differentiated lung adenocarcinoma, stage II History of breast cancer on the left side status postlumpectomy 1992 - Follows outpatient oncology  Permanent atrial fibrillation - Amiodarone, Eliquis, Toprol-XL  Essential hypertension -Toprol.  IV as needed  Awaiting pharmacy to confirm medications.  Thereafter will complete med rec    DVT prophylaxis: Lovenox Code Status: Full  code Family Communication: Family present at bedside Status is: Inpatient Currently in respiratory distress on BiPAP.  Continue hospital stay at least for next 2-3 days       Diet Orders (From admission, onward)     Start     Ordered   02/18/23 2359  Diet NPO time specified Except for: [employer], Sips with Meds  Diet effective now       Question Answer Comment  Except for Ice Chips   Except for Sips with Meds      02/18/23 2358            Subjective: Seen and examined at bedside.  She is tolerating BiPAP.  Difficult to communicate but mentating well.  Family is present at bedside.  Patient confirms that she wants to remain full code.   Examination:  General exam: Appears calm and comfortable, on BiPAP Respiratory system: Bilateral crackles.  Tachypnea Cardiovascular system: S1 & S2 heard, RRR. No JVD, murmurs, rubs, gallops or clicks. No pedal edema. Gastrointestinal system: Abdomen is nondistended, soft and nontender. No organomegaly or masses felt. Normal bowel sounds heard. Central nervous system: Alert and oriented. No focal neurological deficits. Extremities: Symmetric 5 x 5 power. Skin: No rashes, lesions or ulcers Psychiatry: Judgement and insight appear normal. Mood & affect appropriate.  Objective: Vitals:   02/19/23 0700 02/19/23 0730 02/19/23 0753 02/19/23 0815  BP: (!) 119/52 (!) 117/54    Pulse: 60 60    Resp: (!) 26 (!) 27    Temp:   97.9 F (36.6 C)   TempSrc:   Oral   SpO2: 96% 95%  95%  Weight:  Height:       No intake or output data in the 24 hours ending 02/19/23 0829 Filed Weights   02/19/23 0000  Weight: 60.8 kg    Scheduled Meds:  enoxaparin (LOVENOX) injection  40 mg Subcutaneous Q24H   insulin aspart  0-5 Units Subcutaneous QHS   insulin aspart  0-9 Units Subcutaneous TID WC   pantoprazole (PROTONIX) IV  40 mg Intravenous Q24H   sodium chloride flush  3 mL Intravenous Q12H   Continuous Infusions:  azithromycin Stopped  (02/18/23 2352)   cefTRIAXone (ROCEPHIN)  IV Stopped (02/18/23 2248)    Nutritional status     Body mass index is 22.31 kg/m.  Data Reviewed:   CBC: Recent Labs  Lab 02/18/23 1959 02/19/23 0555  WBC 18.1* 21.1*  NEUTROABS 16.3*  --   HGB 11.5* 10.4*  HCT 34.9* 31.9*  MCV 92.3 93.3  PLT 267 256   Basic Metabolic Panel: Recent Labs  Lab 02/18/23 1959 02/19/23 0555  NA 133* 136  K 2.9* 3.2*  CL 100 104  CO2 23 22  GLUCOSE 172* 127*  BUN 13 15  CREATININE 0.81 0.83  CALCIUM 8.5* 8.1*  MG 2.0  --    GFR: Estimated Creatinine Clearance: 42.2 mL/min (by C-G formula based on SCr of 0.83 mg/dL). Liver Function Tests: Recent Labs  Lab 02/18/23 1959  AST 31  ALT 15  ALKPHOS 71  BILITOT 1.6*  PROT 6.8  ALBUMIN 3.5   No results for input(s): "LIPASE", "AMYLASE" in the last 168 hours. No results for input(s): "AMMONIA" in the last 168 hours. Coagulation Profile: Recent Labs  Lab 02/19/23 0555  INR 2.1*   Cardiac Enzymes: No results for input(s): "CKTOTAL", "CKMB", "CKMBINDEX", "TROPONINI" in the last 168 hours. BNP (last 3 results) No results for input(s): "PROBNP" in the last 8760 hours. HbA1C: No results for input(s): "HGBA1C" in the last 72 hours. CBG: Recent Labs  Lab 02/19/23 0119 02/19/23 0750  GLUCAP 143* 113*   Lipid Profile: No results for input(s): "CHOL", "HDL", "LDLCALC", "TRIG", "CHOLHDL", "LDLDIRECT" in the last 72 hours. Thyroid Function Tests: No results for input(s): "TSH", "T4TOTAL", "FREET4", "T3FREE", "THYROIDAB" in the last 72 hours. Anemia Panel: No results for input(s): "VITAMINB12", "FOLATE", "FERRITIN", "TIBC", "IRON", "RETICCTPCT" in the last 72 hours. Sepsis Labs: Recent Labs  Lab 02/18/23 1959 02/18/23 2233  LATICACIDVEN 2.6* 1.5    Recent Results (from the past 240 hour(s))  SARS Coronavirus 2 by RT PCR (hospital order, performed in [facility] hospital lab) *cepheid single result test* Anterior Nasal Swab      Status: None   Collection Time: 02/18/23  7:59 PM   Specimen: Anterior Nasal Swab  Result Value Ref Range Status   SARS Coronavirus 2 by RT PCR NEGATIVE NEGATIVE Final    Comment: (NOTE) SARS-CoV-2 target nucleic acids are NOT DETECTED.  The SARS-CoV-2 RNA is generally detectable in upper and lower respiratory specimens during the acute phase of infection. The lowest concentration of SARS-CoV-2 viral copies this assay can detect is 250 copies / mL. A negative result does not preclude SARS-CoV-2 infection and should not be used as the sole basis for treatment or other patient management decisions.  A negative result may occur with improper specimen collection / handling, submission of specimen other than nasopharyngeal swab, presence of viral mutation(s) within the areas targeted by this assay, and inadequate number of viral copies (<250 copies / mL). A negative result must be combined with clinical observations, patient history, and  epidemiological information.  Fact Sheet for Patients:   RoadLapTop.co.za  Fact Sheet for Healthcare Providers: http://kim-miller.com/  This test is not yet approved or  cleared by the [place] FDA and has been authorized for detection and/or diagnosis of SARS-CoV-2 by FDA under an Emergency Use Authorization (EUA).  This EUA will remain in effect (meaning this test can be used) for the duration of the COVID-19 declaration under Section 564(b)(1) of the Act, 21 U.S.C. section 360bbb-3(b)(1), unless the authorization is terminated or revoked sooner.  Performed at [facility], [address] [place], [place] [address]   Culture, blood (Routine X 2) w Reflex to ID Panel     Status: None (Preliminary result)   Collection Time: 02/18/23 10:29 PM   Specimen: BLOOD  Result Value Ref Range Status   Specimen Description BLOOD HAND  Final   Special Requests   Final    BOTTLES DRAWN AEROBIC AND  ANAEROBIC Blood Culture adequate volume   Culture   Final    NO GROWTH < 12 HOURS Performed at [facility], [address] [place], [place] [address]    Report Status PENDING  Incomplete         Radiology Studies: DG Chest 1 View  Result Date: 02/18/2023 CLINICAL DATA:  Hypoxia EXAM: CHEST  1 VIEW COMPARISON:  08/05/2022 FINDINGS: Transverse diameter of heart is increased. Central pulmonary vessels are prominent. Increased interstitial and alveolar markings are seen in both parahilar regions, more so on the right side. Increased interstitial and alveolar markings are seen in the lower lung fields, more so on the right side. There is blunting of lateral CP angles. There is no pneumothorax. Pacemaker battery is seen in the right infraclavicular region. Surgical clips are seen in left chest wall. IMPRESSION: Cardiomegaly. Extensive new interstitial and alveolar densities are seen in the parahilar regions and lower lung fields, more so on the right side. Findings suggest possible pulmonary edema or extensive bilateral multifocal pneumonia. Electronically Signed   By: Ernie Avena M.D.   On: 02/18/2023 21:08           LOS: 1 day   Critical care time spent : 45 minutes examining the patient, discussing with RN, consultants as needed, coordinating care and management.The medical decision making on this patient was of high complexity, the critically ill patient is at high risk for clinical deterioration.  Critical care time was exclusive of separately billable procedures and treating other patients. Critical care was necessary to treat or prevent imminent or life-threatening deterioration. Critical care was time spent personally by me on the following activities: development of treatment plan with patient and/or surrogate as well as nursing, discussions with consultants, evaluation of patient's response to treatment, examination of patient, obtaining history from patient or  surrogate, ordering and performing treatments and interventions, ordering and review of laboratory studies, ordering and review of radiographic studies, pulse oximetry and re-evaluation of patient's condition.     Malu Pellegrini Joline Maxcy, MD [facility]  If 7PM-7AM, please contact night-coverage  02/19/2023, 8:29 AM

## 2023-02-19 NOTE — ED Notes (Signed)
This RN went into patient room to administer medication and patient c/o nausea and being uncomfortable. Patient observed flailing in the bed, oxygen desating. This RN checked oxygen sat monitor on right index finger and replaced oxygen sat on left middle finger. Oxygen was ranging 72-88% while on BiPAP. This RN contacted respiratory therapist for consult and BiPAP setting changed from 40 to 60%

## 2023-02-19 NOTE — ED Notes (Signed)
Patient brief and linen changed. Purewick displaced while patient was flailing around the bed and bed became wet.

## 2023-02-19 NOTE — Progress Notes (Signed)
Made dr. Nelson Chimes bp 99/66 map 75 per md okay to hold scheduled dose of spironolactone and give scheduled dose lasix 40mg  iv

## 2023-02-19 NOTE — ED Notes (Signed)
Attempted to call report to ICU and staff is in meeting. Will get call back in 15 mins per Diplomatic Services operational officer.

## 2023-02-20 ENCOUNTER — Inpatient Hospital Stay: Payer: Medicare HMO

## 2023-02-20 DIAGNOSIS — J189 Pneumonia, unspecified organism: Secondary | ICD-10-CM | POA: Diagnosis not present

## 2023-02-20 DIAGNOSIS — J9601 Acute respiratory failure with hypoxia: Secondary | ICD-10-CM | POA: Diagnosis not present

## 2023-02-20 LAB — BASIC METABOLIC PANEL
Anion gap: 7 (ref 5–15)
BUN: 22 mg/dL (ref 8–23)
CO2: 22 mmol/L (ref 22–32)
Calcium: 8 mg/dL — ABNORMAL LOW (ref 8.9–10.3)
Chloride: 104 mmol/L (ref 98–111)
Creatinine, Ser: 0.91 mg/dL (ref 0.44–1.00)
GFR, Estimated: >60 mL/min (ref >60)
Glucose, Bld: 138 mg/dL — ABNORMAL HIGH (ref 70–99)
Potassium: 4.2 mmol/L (ref 3.5–5.1)
Sodium: 133 mmol/L — ABNORMAL LOW (ref 135–145)

## 2023-02-20 LAB — CBC
HCT: 30.3 % — ABNORMAL LOW (ref 36.0–46.0)
Hemoglobin: 9.9 g/dL — ABNORMAL LOW (ref 12.0–15.0)
MCH: 30 pg (ref 26.0–34.0)
MCHC: 32.7 g/dL (ref 30.0–36.0)
MCV: 91.8 fL (ref 80.0–100.0)
Platelets: 224 10*3/uL (ref 150–400)
RBC: 3.3 MIL/uL — ABNORMAL LOW (ref 3.87–5.11)
RDW: 13.7 % (ref 11.5–15.5)
WBC: 21.9 10*3/uL — ABNORMAL HIGH (ref 4.0–10.5)
nRBC: 0 % (ref 0.0–0.2)

## 2023-02-20 LAB — BODY FLUID CELL COUNT WITH DIFFERENTIAL
Eos, Fluid: 0 %
Lymphs, Fluid: 67 %
Monocyte-Macrophage-Serous Fluid: 1 %
Neutrophil Count, Fluid: 32 %
Total Nucleated Cell Count, Fluid: 211 cu mm

## 2023-02-20 LAB — BRAIN NATRIURETIC PEPTIDE: B Natriuretic Peptide: 1775.3 pg/mL — ABNORMAL HIGH (ref 0.0–100.0)

## 2023-02-20 LAB — MAGNESIUM
Magnesium: 2 mg/dL (ref 1.7–2.4)
Magnesium: 1.9 mg/dL (ref 1.7–2.4)

## 2023-02-20 LAB — GLUCOSE, CAPILLARY
Glucose-Capillary: 109 mg/dL — ABNORMAL HIGH (ref 70–99)
Glucose-Capillary: 120 mg/dL — ABNORMAL HIGH (ref 70–99)
Glucose-Capillary: 111 mg/dL — ABNORMAL HIGH (ref 70–99)
Glucose-Capillary: 143 mg/dL — ABNORMAL HIGH (ref 70–99)

## 2023-02-20 LAB — LACTATE DEHYDROGENASE, PLEURAL OR PERITONEAL FLUID: LD, Fluid: 292 U/L — ABNORMAL HIGH (ref 3–23)

## 2023-02-20 LAB — POTASSIUM: Potassium: 3.7 mmol/L (ref 3.5–5.1)

## 2023-02-20 LAB — TROPONIN I (HIGH SENSITIVITY)
Troponin I (High Sensitivity): 50 ng/L — ABNORMAL HIGH (ref <18)
Troponin I (High Sensitivity): 61 ng/L — ABNORMAL HIGH (ref <18)

## 2023-02-20 MED ORDER — MORPHINE SULFATE (PF) 2 MG/ML IV SOLN
1.0000 mg | Freq: Once | INTRAVENOUS | Status: AC
  Administered 2023-02-20: 1 mg via INTRAVENOUS

## 2023-02-20 MED ORDER — AMIODARONE HCL IN DEXTROSE 360-4.14 MG/200ML-% IV SOLN
30.0000 mg/h | INTRAVENOUS | Status: DC
  Administered 2023-02-21 – 2023-02-22 (×3): 30 mg/h via INTRAVENOUS
  Filled 2023-02-20 (×3): qty 200

## 2023-02-20 MED ORDER — MORPHINE SULFATE (PF) 2 MG/ML IV SOLN
1.0000 mg | INTRAVENOUS | Status: DC | PRN
  Administered 2023-02-21 (×2): 1 mg via INTRAVENOUS
  Filled 2023-02-20 (×2): qty 1

## 2023-02-20 MED ORDER — AMIODARONE IV BOLUS ONLY 150 MG/100ML
INTRAVENOUS | Status: AC
  Filled 2023-02-20: qty 100

## 2023-02-20 MED ORDER — MORPHINE SULFATE (PF) 2 MG/ML IV SOLN
INTRAVENOUS | Status: AC
  Filled 2023-02-20: qty 1

## 2023-02-20 MED ORDER — AMIODARONE IV BOLUS ONLY 150 MG/100ML
150.0000 mg | Freq: Once | INTRAVENOUS | Status: AC
  Administered 2023-02-20: 150 mg via INTRAVENOUS

## 2023-02-20 MED ORDER — IPRATROPIUM BROMIDE 0.02 % IN SOLN
0.5000 mg | Freq: Three times a day (TID) | RESPIRATORY_TRACT | Status: DC
  Administered 2023-02-20 – 2023-02-21 (×2): 0.5 mg via RESPIRATORY_TRACT
  Filled 2023-02-20 (×2): qty 2.5

## 2023-02-20 MED ORDER — MORPHINE SULFATE (PF) 2 MG/ML IV SOLN
1.0000 mg | Freq: Once | INTRAVENOUS | Status: AC
  Administered 2023-02-20: 1 mg via INTRAVENOUS
  Filled 2023-02-20: qty 1

## 2023-02-20 MED ORDER — AMIODARONE HCL IN DEXTROSE 360-4.14 MG/200ML-% IV SOLN
60.0000 mg/h | INTRAVENOUS | Status: AC
  Administered 2023-02-20 – 2023-02-21 (×2): 60 mg/h via INTRAVENOUS
  Filled 2023-02-20: qty 200

## 2023-02-20 MED ORDER — ONDANSETRON HCL 4 MG/2ML IJ SOLN
4.0000 mg | Freq: Four times a day (QID) | INTRAMUSCULAR | Status: DC | PRN
  Administered 2023-02-20: 4 mg via INTRAVENOUS
  Filled 2023-02-20: qty 2

## 2023-02-20 MED ORDER — LIDOCAINE HCL (PF) 1 % IJ SOLN
10.0000 mL | Freq: Once | INTRAMUSCULAR | Status: AC
  Administered 2023-02-20: 10 mL via SUBCUTANEOUS
  Filled 2023-02-20: qty 10

## 2023-02-20 MED ORDER — LEVALBUTEROL HCL 0.63 MG/3ML IN NEBU
0.6300 mg | INHALATION_SOLUTION | Freq: Three times a day (TID) | RESPIRATORY_TRACT | Status: DC
  Administered 2023-02-20 – 2023-02-22 (×5): 0.63 mg via RESPIRATORY_TRACT
  Filled 2023-02-20 (×5): qty 3

## 2023-02-20 NOTE — Consult Note (Signed)
PHARMACY CONSULT NOTE - FOLLOW UP  Pharmacy Consult for Electrolyte Monitoring and Replacement   Recent Labs: Potassium (mmol/L)  Date Value  02/20/2023 4.2   Magnesium (mg/dL)  Date Value  16/06/9603 2.0   Calcium (mg/dL)  Date Value  54/05/8118 8.0 (L)   Albumin (g/dL)  Date Value  14/78/2956 3.5   Phosphorus (mg/dL)  Date Value  21/30/8657 3.7   Sodium (mmol/L)  Date Value  02/20/2023 133 (L)    Assessment: 89 y.o. female with PMH stage IV lung adenocarcinoma, h/o breast cancer, Afib on Eliquis, HFrEF who presents with SOB and hypoxia requiring CPAP. CXR c/f possible pneumonia.  Goal of Therapy:  K >/= 4.0 and Mg >/= 2.0  Plan:  No electrolyte replacement warranted. Follow-up electrolytes with AM labs.  Bettey Costa, PharmD Clinical Pharmacist 02/20/2023 8:41 AM

## 2023-02-20 NOTE — Procedures (Signed)
PROCEDURE SUMMARY:  Successful US guided diagnostic and therapeutic right thoracentesis. Yielded 450 cc of hazy, amber fluid. Pt tolerated procedure well. No immediate complications.  Specimen was sent for labs. CXR ordered.  EBL < 1 mL  Shon Hough, AGNP 02/20/2023 4:07 PM

## 2023-02-20 NOTE — Progress Notes (Signed)
PROGRESS NOTE    Daisy Parks  ZOX:[id number] DOB: 08-16-1932 DOA: 01/21/2023 PCP: Kurtis Bushman, MD   Brief Narrative:  89 year old with history of lung cancer with mets follows at [facility] oncology, atrial fibrillation on Eliquis, HTN, HLD comes to the hospital with complaints of progressive dyspnea on exertion and shortness of breath.  Upon admission found to have pneumonia started on IV antibiotics and required BiPAP.  Patient was thought to be in volume overload requiring IV Lasix.  Slowly being transition to high flow nasal cannula.  Pulmonary consulted as patient will require thoracentesis.  Currently remains on high flow.   Assessment & Plan:  Principal Problem:   Community acquired pneumonia Active Problems:   Hypokalemia   Acute respiratory failure with hypoxia (HCC)     Acute respiratory failure with hypoxia Bilateral pleural effusions - I suspect this is mostly from volume overload from CHF exacerbation.  She is slowly being transition from BiPAP to high flow.  BNP remains elevated, procalcitonin 0.3.  Discussed and consulted with pulmonary as patient will likely require thoracentesis.  Elevated BNP Acute CHF with preserved EF 55%; class IV - Very certainly signs of volume overload.  Continue fluid restriction, monitor and replete electrolytes as needed.  Lasix 40 mg IV twice daily.  Multifocal community-acquired pneumonia - Respiratory panel is negative.  Procalcitonin 0.3.  My suspicion for pneumonia is low but for now given her Eliquis will continue short course of IV antibiotics along with bronchodilators  Hypokalemia Replete as needed  Right upper lobe well-differentiated lung adenocarcinoma, stage II History of breast cancer on the left side status postlumpectomy 1992 - Follows outpatient oncology  Permanent atrial fibrillation - Amiodarone, Eliquis, Toprol-XL  Essential hypertension -Toprol.  IV as needed   DVT prophylaxis: Eliquis Code  Status: Full code Family Communication:  Status is: Inpatient Remains on high flow     Diet Orders (From admission, onward)     Start     Ordered   02/19/23 1556  Diet full liquid Room service appropriate? Yes; Fluid consistency: Thin  Diet effective now       Question Answer Comment  Room service appropriate? Yes   Fluid consistency: Thin      02/19/23 1555            Subjective: Seen and examined at bedside.  Still remains hypoxic and some respiratory distress.  Examination:  Constitutional: Not in acute distress.  High flow oxygen Respiratory: Diffuse bilateral rhonchi.  Tachypnea Cardiovascular: Normal sinus rhythm, no rubs Abdomen: Nontender nondistended good bowel sounds Musculoskeletal: No edema noted Skin: No rashes seen Neurologic: CN 2-12 grossly intact.  And nonfocal Psychiatric: Normal judgment and insight. Alert and oriented x 3. Normal mood.  Objective: Vitals:   02/20/23 0500 02/20/23 0600 02/20/23 0730 02/20/23 0800  BP:  103/69  (!) 107/54  Pulse: 98 (!) 52  73  Resp: (!) 28 17  (!) 25  Temp:    98.5 F (36.9 C)  TempSrc:    Axillary  SpO2: 92% 92% 92% 94%  Weight:      Height:        Intake/Output Summary (Last 24 hours) at 02/20/2023 0819 Last data filed at 02/20/2023 0500 Gross per 24 hour  Intake 1522.2 ml  Output 1000 ml  Net 522.2 ml   Filed Weights   02/19/23 0000 02/19/23 1106  Weight: 60.8 kg 60.8 kg    Scheduled Meds:  amiodarone  200 mg Oral Daily   apixaban  5 mg Oral BID   Chlorhexidine Gluconate Cloth  6 each Topical Daily   furosemide  40 mg Intravenous BID   insulin aspart  0-5 Units Subcutaneous QHS   insulin aspart  0-9 Units Subcutaneous TID WC   ipratropium-albuterol  3 mL Nebulization TID   metoprolol succinate  12.5 mg Oral BID   pantoprazole (PROTONIX) IV  40 mg Intravenous Q24H   sertraline  50 mg Oral Daily   sodium chloride flush  3 mL Intravenous Q12H   spironolactone  12.5 mg Oral Daily    Continuous Infusions:  azithromycin 500 mg (02/19/23 2125)   cefTRIAXone (ROCEPHIN)  IV 1 g (02/19/23 2235)    Nutritional status     Body mass index is 22.31 kg/m.  Data Reviewed:   CBC: Recent Labs  Lab 02/03/2023 1959 02/19/23 0555 02/20/23 0258  WBC 18.1* 21.1* 21.9*  NEUTROABS 16.3*  --   --   HGB 11.5* 10.4* 9.9*  HCT 34.9* 31.9* 30.3*  MCV 92.3 93.3 91.8  PLT 267 256 224   Basic Metabolic Panel: Recent Labs  Lab 02/06/2023 1959 02/19/23 0555 02/19/23 1007 02/20/23 0258  NA 133* 136  --  133*  K 2.9* 3.2*  --  4.2  CL 100 104  --  104  CO2 23 22  --  22  GLUCOSE 172* 127*  --  138*  BUN 13 15  --  22  CREATININE 0.81 0.83  --  0.91  CALCIUM 8.5* 8.1*  --  8.0*  MG 2.0  --   --  2.0  PHOS  --   --  3.7  --    GFR: Estimated Creatinine Clearance: 38.5 mL/min (by C-G formula based on SCr of 0.91 mg/dL). Liver Function Tests: Recent Labs  Lab 02/13/2023 1959  AST 31  ALT 15  ALKPHOS 71  BILITOT 1.6*  PROT 6.8  ALBUMIN 3.5   No results for input(s): "LIPASE", "AMYLASE" in the last 168 hours. No results for input(s): "AMMONIA" in the last 168 hours. Coagulation Profile: Recent Labs  Lab 02/19/23 0555  INR 2.1*   Cardiac Enzymes: No results for input(s): "CKTOTAL", "CKMB", "CKMBINDEX", "TROPONINI" in the last 168 hours. BNP (last 3 results) No results for input(s): "PROBNP" in the last 8760 hours. HbA1C: Recent Labs    02/19/23 0555  HGBA1C 5.8*   CBG: Recent Labs  Lab 02/19/23 0750 02/19/23 1106 02/19/23 1622 02/19/23 2133 02/20/23 0732  GLUCAP 113* 114* 142* 171* 109*   Lipid Profile: No results for input(s): "CHOL", "HDL", "LDLCALC", "TRIG", "CHOLHDL", "LDLDIRECT" in the last 72 hours. Thyroid Function Tests: No results for input(s): "TSH", "T4TOTAL", "FREET4", "T3FREE", "THYROIDAB" in the last 72 hours. Anemia Panel: No results for input(s): "VITAMINB12", "FOLATE", "FERRITIN", "TIBC", "IRON", "RETICCTPCT" in the last 72  hours. Sepsis Labs: Recent Labs  Lab 02/08/2023 1959 02/09/2023 2233 02/19/23 1007  PROCALCITON  --   --  0.31  LATICACIDVEN 2.6* 1.5  --     Recent Results (from the past 240 hour(s))  SARS Coronavirus 2 by RT PCR (hospital order, performed in [facility] hospital lab) *cepheid single result test* Anterior Nasal Swab     Status: None   Collection Time: 02/11/2023  7:59 PM   Specimen: Anterior Nasal Swab  Result Value Ref Range Status   SARS Coronavirus 2 by RT PCR NEGATIVE NEGATIVE Final    Comment: (NOTE) SARS-CoV-2 target nucleic acids are NOT DETECTED.  The SARS-CoV-2 RNA is generally detectable in upper and  lower respiratory specimens during the acute phase of infection. The lowest concentration of SARS-CoV-2 viral copies this assay can detect is 250 copies / mL. A negative result does not preclude SARS-CoV-2 infection and should not be used as the sole basis for treatment or other patient management decisions.  A negative result may occur with improper specimen collection / handling, submission of specimen other than nasopharyngeal swab, presence of viral mutation(s) within the areas targeted by this assay, and inadequate number of viral copies (<250 copies / mL). A negative result must be combined with clinical observations, patient history, and epidemiological information.  Fact Sheet for Patients:   RoadLapTop.co.za  Fact Sheet for Healthcare Providers: http://kim-miller.com/  This test is not yet approved or  cleared by the [place] FDA and has been authorized for detection and/or diagnosis of SARS-CoV-2 by FDA under an Emergency Use Authorization (EUA).  This EUA will remain in effect (meaning this test can be used) for the duration of the COVID-19 declaration under Section 564(b)(1) of the Act, 21 U.S.C. section 360bbb-3(b)(1), unless the authorization is terminated or revoked sooner.  Performed at [facility], [address] [place], [place] [address]   Culture, blood (Routine X 2) w Reflex to ID Panel     Status: None (Preliminary result)   Collection Time: 02/12/2023  7:59 PM   Specimen: BLOOD RIGHT ARM  Result Value Ref Range Status   Specimen Description BLOOD RIGHT ARM  Final   Special Requests   Final    BOTTLES DRAWN AEROBIC AND ANAEROBIC Blood Culture adequate volume   Culture  Setup Time   Final    GRAM POSITIVE COCCI ANAEROBIC BOTTLE ONLY Organism ID to follow CRITICAL RESULT CALLED TO, READ BACK BY AND VERIFIED WITH: Elenora Gamma, PHARMD AT 1128 ON 02/19/23 BY GM Performed at [facility], [address] [place], [place] [address]    Culture GRAM POSITIVE COCCI  Final   Report Status PENDING  Incomplete  Blood Culture ID Panel (Reflexed)     Status: Abnormal   Collection Time: 01/23/2023  7:59 PM  Result Value Ref Range Status   Enterococcus faecalis NOT DETECTED NOT DETECTED Final   Enterococcus Faecium NOT DETECTED NOT DETECTED Final   Listeria monocytogenes NOT DETECTED NOT DETECTED Final   Staphylococcus species DETECTED (A) NOT DETECTED Final    Comment: CRITICAL RESULT CALLED TO, READ BACK BY AND VERIFIED WITH: NATHAN BLUE, PHARMD AT 1128 ON 02/19/23 BY GM    Staphylococcus aureus (BCID) NOT DETECTED NOT DETECTED Final   Staphylococcus epidermidis NOT DETECTED NOT DETECTED Final   Staphylococcus lugdunensis NOT DETECTED NOT DETECTED Final   Streptococcus species NOT DETECTED NOT DETECTED Final   Streptococcus agalactiae NOT DETECTED NOT DETECTED Final   Streptococcus pneumoniae NOT DETECTED NOT DETECTED Final   Streptococcus pyogenes NOT DETECTED NOT DETECTED Final   A.calcoaceticus-baumannii NOT DETECTED NOT DETECTED Final   Bacteroides fragilis NOT DETECTED NOT DETECTED Final   Enterobacterales NOT DETECTED NOT DETECTED Final   Enterobacter cloacae complex NOT DETECTED NOT DETECTED Final   Escherichia coli NOT DETECTED NOT DETECTED Final   Klebsiella  aerogenes NOT DETECTED NOT DETECTED Final   Klebsiella oxytoca NOT DETECTED NOT DETECTED Final   Klebsiella pneumoniae NOT DETECTED NOT DETECTED Final   Proteus species NOT DETECTED NOT DETECTED Final   Salmonella species NOT DETECTED NOT DETECTED Final   Serratia marcescens NOT DETECTED NOT DETECTED Final   Haemophilus influenzae NOT DETECTED NOT DETECTED Final  Neisseria meningitidis NOT DETECTED NOT DETECTED Final   Pseudomonas aeruginosa NOT DETECTED NOT DETECTED Final   Stenotrophomonas maltophilia NOT DETECTED NOT DETECTED Final   Candida albicans NOT DETECTED NOT DETECTED Final   Candida auris NOT DETECTED NOT DETECTED Final   Candida glabrata NOT DETECTED NOT DETECTED Final   Candida krusei NOT DETECTED NOT DETECTED Final   Candida parapsilosis NOT DETECTED NOT DETECTED Final   Candida tropicalis NOT DETECTED NOT DETECTED Final   Cryptococcus neoformans/gattii NOT DETECTED NOT DETECTED Final    Comment: Performed at [facility], [address] [place], [place] [address]  Culture, blood (Routine X 2) w Reflex to ID Panel     Status: None (Preliminary result)   Collection Time: 02/15/2023 10:29 PM   Specimen: BLOOD  Result Value Ref Range Status   Specimen Description BLOOD HAND  Final   Special Requests   Final    BOTTLES DRAWN AEROBIC AND ANAEROBIC Blood Culture adequate volume   Culture   Final    NO GROWTH 2 DAYS Performed at [facility], [address] [place], [place] [address]    Report Status PENDING  Incomplete  MRSA Next Gen by PCR, Nasal     Status: None   Collection Time: 02/19/23 11:15 AM   Specimen: Nasal Mucosa; Nasal Swab  Result Value Ref Range Status   MRSA by PCR Next Gen NOT DETECTED NOT DETECTED Final    Comment: (NOTE) The GeneXpert MRSA Assay (FDA approved for NASAL specimens only), is one component of a comprehensive MRSA colonization surveillance program. It is not intended to diagnose MRSA infection nor to guide or monitor  treatment for MRSA infections. Test performance is not FDA approved in patients less than 43 years old. Performed at [facility], [address] [place], [place] [address]   Respiratory (~20 pathogens) panel by PCR     Status: None   Collection Time: 02/19/23 11:26 AM   Specimen: Anterior Nasal Swab; Respiratory  Result Value Ref Range Status   Adenovirus NOT DETECTED NOT DETECTED Final   Coronavirus 229E NOT DETECTED NOT DETECTED Final    Comment: (NOTE) The Coronavirus on the Respiratory Panel, DOES NOT test for the novel  Coronavirus (2019 nCoV)    Coronavirus HKU1 NOT DETECTED NOT DETECTED Final   Coronavirus NL63 NOT DETECTED NOT DETECTED Final   Coronavirus OC43 NOT DETECTED NOT DETECTED Final   Metapneumovirus NOT DETECTED NOT DETECTED Final   Rhinovirus / Enterovirus NOT DETECTED NOT DETECTED Final   Influenza A NOT DETECTED NOT DETECTED Final   Influenza B NOT DETECTED NOT DETECTED Final   Parainfluenza Virus 1 NOT DETECTED NOT DETECTED Final   Parainfluenza Virus 2 NOT DETECTED NOT DETECTED Final   Parainfluenza Virus 3 NOT DETECTED NOT DETECTED Final   Parainfluenza Virus 4 NOT DETECTED NOT DETECTED Final   Respiratory Syncytial Virus NOT DETECTED NOT DETECTED Final   Bordetella pertussis NOT DETECTED NOT DETECTED Final   Bordetella Parapertussis NOT DETECTED NOT DETECTED Final   Chlamydophila pneumoniae NOT DETECTED NOT DETECTED Final   Mycoplasma pneumoniae NOT DETECTED NOT DETECTED Final    Comment: Performed at [facility], [address], [place], [place] [address]         Radiology Studies: CT CHEST WO CONTRAST  Result Date: 02/19/2023 CLINICAL DATA:  Abnormal chest radiograph EXAM: CT CHEST WITHOUT CONTRAST TECHNIQUE: Multidetector CT imaging of the chest was performed following the standard protocol without IV contrast. RADIATION DOSE REDUCTION:  This exam was performed according to the departmental dose-optimization program which includes  automated exposure control, adjustment of the mA and/or kV according to patient size and/or use of iterative reconstruction technique. COMPARISON:  Chest x-ray dated Feb 18, 2023 FINDINGS: Cardiovascular: Cardiomegaly. No pericardial effusion. Normal caliber thoracic aorta with severe calcified plaque. Severe three-vessel coronary artery calcifications. Right chest wall dual lead pacer with leads in the right ventricle and right atrium. Mediastinum/Nodes: Esophagus and thyroid are unremarkable. No enlarged lymph nodes seen in the chest. Lungs/Pleura: Central airways are patent. Evaluation of the lungs is somewhat limited due to respiratory motion artifact. Central predominant ground-glass opacities with scattered more confluent areas of consolidation and associated septal thickening. Moderate right and small left pleural effusions and bibasilar atelectasis. Upper Abdomen: Scattered low-attenuation liver lesions which are likely simple cysts. No acute abnormality. Musculoskeletal: No chest wall mass or suspicious bone lesions identified. IMPRESSION: 1. Central predominant ground-glass opacities with scattered more confluent areas of consolidation and associated septal thickening, findings are likely due to pulmonary edema. 2. Moderate right and small left pleural effusions. 3. Cardiomegaly. 4. Severe coronary artery and aortic atherosclerosis (ICD10-I70.0). Electronically Signed   By: Allegra Lai M.D.   On: 02/19/2023 11:25   DG Chest 1 View  Result Date: 02/11/2023 CLINICAL DATA:  Hypoxia EXAM: CHEST  1 VIEW COMPARISON:  08/05/2022 FINDINGS: Transverse diameter of heart is increased. Central pulmonary vessels are prominent. Increased interstitial and alveolar markings are seen in both parahilar regions, more so on the right side. Increased interstitial and alveolar markings are seen in the lower lung fields, more so on the right side. There is blunting of lateral CP angles. There is no pneumothorax.  Pacemaker battery is seen in the right infraclavicular region. Surgical clips are seen in left chest wall. IMPRESSION: Cardiomegaly. Extensive new interstitial and alveolar densities are seen in the parahilar regions and lower lung fields, more so on the right side. Findings suggest possible pulmonary edema or extensive bilateral multifocal pneumonia. Electronically Signed   By: Ernie Avena M.D.   On: 02/09/2023 21:08           LOS: 2 days   Critical care time spent : 45 minutes examining the patient, discussing with RN, consultants as needed, coordinating care and management.The medical decision making on this patient was of high complexity, the critically ill patient is at high risk for clinical deterioration.  Critical care time was exclusive of separately billable procedures and treating other patients. Critical care was necessary to treat or prevent imminent or life-threatening deterioration. Critical care was time spent personally by me on the following activities: development of treatment plan with patient and/or surrogate as well as nursing, discussions with consultants, evaluation of patient's response to treatment, examination of patient, obtaining history from patient or surrogate, ordering and performing treatments and interventions, ordering and review of laboratory studies, ordering and review of radiographic studies, pulse oximetry and re-evaluation of patient's condition.     Geremiah Fussell Joline Maxcy, MD [facility]  If 7PM-7AM, please contact night-coverage  02/20/2023, 8:19 AM

## 2023-02-20 NOTE — Consult Note (Signed)
CRITICAL CARE PROGRESS NOTE    Name: Daisy Parks MRN: [id number] DOB: [birth year]     LOS: 2   SUBJECTIVE FINDINGS & SIGNIFICANT EVENTS    Patient description:  Very pleasant 89 yo F with hx of advanced lung cancer follow med/onc with [facility], atrial fibrillation on Eliquis, HTN, HLD comes to the hospital with complaints of progressive dyspnea on exertion and shortness of breath.  She has had treatment for lung cancer and was discussing radiation therapy with oncology.  She was noted to have bilateral pleural effusions worse on right with interstitial edema and hypotension. She is on eliquis.  We had Throacentesis performed via IR service and 450cc fluid has been removed. She has gram + cocci growing on blood cultures. Daugther is at bedside and we discussed goals of care.  She has met with palliative care and appears to have transitioned to DNR.   Lines/tubes : External Urinary Catheter (Active)  Collection Container Dedicated Suction Canister 02/20/23 0800  Suction (Verified suction is between 40-80 mmHg) Yes 02/20/23 0800  Securement Method Securing device (Describe) 02/20/23 0800  Site Assessment Clean, Dry, Intact 02/20/23 0800  Intervention Female External Urinary Catheter Replaced 02/20/23 0335  Output (mL) 300 mL 02/20/23 0300    Microbiology/Sepsis markers: Results for orders placed or performed during the hospital encounter of 02/07/2023  SARS Coronavirus 2 by RT PCR (hospital order, performed in [facility] hospital lab) *cepheid single result test* Anterior Nasal Swab     Status: None   Collection Time: 01/27/2023  7:59 PM   Specimen: Anterior Nasal Swab  Result Value Ref Range Status   SARS Coronavirus 2 by RT PCR NEGATIVE NEGATIVE Final    Comment: (NOTE) SARS-CoV-2 target nucleic acids are  NOT DETECTED.  The SARS-CoV-2 RNA is generally detectable in upper and lower respiratory specimens during the acute phase of infection. The lowest concentration of SARS-CoV-2 viral copies this assay can detect is 250 copies / mL. A negative result does not preclude SARS-CoV-2 infection and should not be used as the sole basis for treatment or other patient management decisions.  A negative result may occur with improper specimen collection / handling, submission of specimen other than nasopharyngeal swab, presence of viral mutation(s) within the areas targeted by this assay, and inadequate number of viral copies (<250 copies / mL). A negative result must be combined with clinical observations, patient history, and epidemiological information.  Fact Sheet for Patients:   RoadLapTop.co.za  Fact Sheet for Healthcare Providers: http://kim-miller.com/  This test is not yet approved or  cleared by the [place] FDA and has been authorized for detection and/or diagnosis of SARS-CoV-2 by FDA under an Emergency Use Authorization (EUA).  This EUA will remain in effect (meaning this test can be used) for the duration of the COVID-19 declaration under Section 564(b)(1) of the Act, 21 U.S.C. section 360bbb-3(b)(1), unless the authorization is terminated or revoked sooner.  Performed at [facility], [address] [place], [place] [address]   Culture, blood (Routine X 2) w Reflex to ID Panel     Status: None (Preliminary result)   Collection Time: 02/12/2023  7:59 PM   Specimen: BLOOD RIGHT ARM  Result Value Ref Range Status   Specimen Description BLOOD RIGHT ARM  Final   Special Requests   Final    BOTTLES DRAWN AEROBIC AND ANAEROBIC Blood Culture adequate volume   Culture  Setup Time   Final    GRAM POSITIVE COCCI ANAEROBIC  BOTTLE ONLY Organism ID to follow CRITICAL RESULT CALLED TO, READ BACK BY AND VERIFIED WITH: Elenora Gamma,  PHARMD AT 1128 ON 02/19/23 BY GM Performed at [facility], [address] [place], [place] [address]    Culture [facility] POSITIVE COCCI  Final   Report Status PENDING  Incomplete  Blood Culture ID Panel (Reflexed)     Status: Abnormal   Collection Time: 01/26/2023  7:59 PM  Result Value Ref Range Status   Enterococcus faecalis NOT DETECTED NOT DETECTED Final   Enterococcus Faecium NOT DETECTED NOT DETECTED Final   Listeria monocytogenes NOT DETECTED NOT DETECTED Final   Staphylococcus species DETECTED (A) NOT DETECTED Final    Comment: CRITICAL RESULT CALLED TO, READ BACK BY AND VERIFIED WITH: NATHAN BLUE, PHARMD AT 1128 ON 02/19/23 BY GM    Staphylococcus aureus (BCID) NOT DETECTED NOT DETECTED Final   Staphylococcus epidermidis NOT DETECTED NOT DETECTED Final   Staphylococcus lugdunensis NOT DETECTED NOT DETECTED Final   Streptococcus species NOT DETECTED NOT DETECTED Final   Streptococcus agalactiae NOT DETECTED NOT DETECTED Final   Streptococcus pneumoniae NOT DETECTED NOT DETECTED Final   Streptococcus pyogenes NOT DETECTED NOT DETECTED Final   A.calcoaceticus-baumannii NOT DETECTED NOT DETECTED Final   Bacteroides fragilis NOT DETECTED NOT DETECTED Final   Enterobacterales NOT DETECTED NOT DETECTED Final   Enterobacter cloacae complex NOT DETECTED NOT DETECTED Final   Escherichia coli NOT DETECTED NOT DETECTED Final   Klebsiella aerogenes NOT DETECTED NOT DETECTED Final   Klebsiella oxytoca NOT DETECTED NOT DETECTED Final   Klebsiella pneumoniae NOT DETECTED NOT DETECTED Final   Proteus species NOT DETECTED NOT DETECTED Final   Salmonella species NOT DETECTED NOT DETECTED Final   Serratia marcescens NOT DETECTED NOT DETECTED Final   Haemophilus influenzae NOT DETECTED NOT DETECTED Final   Neisseria meningitidis NOT DETECTED NOT DETECTED Final   Pseudomonas aeruginosa NOT DETECTED NOT DETECTED Final   Stenotrophomonas maltophilia NOT DETECTED NOT DETECTED Final   Candida  albicans NOT DETECTED NOT DETECTED Final   Candida auris NOT DETECTED NOT DETECTED Final   Candida glabrata NOT DETECTED NOT DETECTED Final   Candida krusei NOT DETECTED NOT DETECTED Final   Candida parapsilosis NOT DETECTED NOT DETECTED Final   Candida tropicalis NOT DETECTED NOT DETECTED Final   Cryptococcus neoformans/gattii NOT DETECTED NOT DETECTED Final    Comment: Performed at [facility], [address] [place], [place] [address]  Culture, blood (Routine X 2) w Reflex to ID Panel     Status: None (Preliminary result)   Collection Time: 01/28/2023 10:29 PM   Specimen: BLOOD  Result Value Ref Range Status   Specimen Description BLOOD HAND  Final   Special Requests   Final    BOTTLES DRAWN AEROBIC AND ANAEROBIC Blood Culture adequate volume   Culture   Final    NO GROWTH 2 DAYS Performed at [facility], [address] [place], [place] [address]    Report Status PENDING  Incomplete  MRSA Next Gen by PCR, Nasal     Status: None   Collection Time: 02/19/23 11:15 AM   Specimen: Nasal Mucosa; Nasal Swab  Result Value Ref Range Status   MRSA by PCR Next Gen NOT DETECTED NOT DETECTED Final    Comment: (NOTE) The GeneXpert MRSA Assay (FDA approved for NASAL specimens only), is one component of a comprehensive MRSA colonization surveillance program. It is not intended to diagnose MRSA infection nor to guide or monitor treatment for MRSA  infections. Test performance is not FDA approved in patients less than 64 years old. Performed at [facility], [address] [place], [place] [address]   Respiratory (~20 pathogens) panel by PCR     Status: None   Collection Time: 02/19/23 11:26 AM   Specimen: Anterior Nasal Swab; Respiratory  Result Value Ref Range Status   Adenovirus NOT DETECTED NOT DETECTED Final   Coronavirus 229E NOT DETECTED NOT DETECTED Final    Comment: (NOTE) The Coronavirus on the Respiratory Panel, DOES NOT test for the novel   Coronavirus (2019 nCoV)    Coronavirus HKU1 NOT DETECTED NOT DETECTED Final   Coronavirus NL63 NOT DETECTED NOT DETECTED Final   Coronavirus OC43 NOT DETECTED NOT DETECTED Final   Metapneumovirus NOT DETECTED NOT DETECTED Final   Rhinovirus / Enterovirus NOT DETECTED NOT DETECTED Final   Influenza A NOT DETECTED NOT DETECTED Final   Influenza B NOT DETECTED NOT DETECTED Final   Parainfluenza Virus 1 NOT DETECTED NOT DETECTED Final   Parainfluenza Virus 2 NOT DETECTED NOT DETECTED Final   Parainfluenza Virus 3 NOT DETECTED NOT DETECTED Final   Parainfluenza Virus 4 NOT DETECTED NOT DETECTED Final   Respiratory Syncytial Virus NOT DETECTED NOT DETECTED Final   Bordetella pertussis NOT DETECTED NOT DETECTED Final   Bordetella Parapertussis NOT DETECTED NOT DETECTED Final   Chlamydophila pneumoniae NOT DETECTED NOT DETECTED Final   Mycoplasma pneumoniae NOT DETECTED NOT DETECTED Final    Comment: Performed at [facility], [address], [place], [place] [address]    Anti-infectives:  Anti-infectives (From admission, onward)    Start     Dose/Rate Route Frequency Ordered Stop   02/16/2023 2215  cefTRIAXone (ROCEPHIN) 1 g in sodium chloride 0.9 % 100 mL IVPB        1 g 200 mL/hr over 30 Minutes Intravenous Every 24 hours 01/22/2023 2203     02/08/2023 2215  azithromycin (ZITHROMAX) 500 mg in sodium chloride 0.9 % 250 mL IVPB        500 mg 250 mL/hr over 60 Minutes Intravenous Every 24 hours 02/10/2023 2203 02/21/23 2214   02/19/2023 2130  vancomycin (VANCOCIN) IVPB 1000 mg/200 mL premix  Status:  Discontinued        1,000 mg 200 mL/hr over 60 Minutes Intravenous  Once 02/03/2023 2118 02/05/2023 2120   02/10/2023 2130  ceFEPIme (MAXIPIME) 2 g in sodium chloride 0.9 % 100 mL IVPB  Status:  Discontinued        2 g 200 mL/hr over 30 Minutes Intravenous  Once 02/06/2023 2118 02/07/2023 2206   02/17/2023 2130  vancomycin (VANCOREADY) IVPB 1500 mg/300 mL        1,500 mg 150 mL/hr over 120 Minutes  Intravenous  Once 02/03/2023 2120 02/19/23 0156         PAST MEDICAL HISTORY   Past Medical History:  Diagnosis Date   Atrial fibrillation (HCC)    Breast cancer (HCC) 1192   LUMPECTOMY   Cancer (HCC)    Coronary artery disease    Hyperlipidemia    Hypertension    Syncope and collapse      SURGICAL HISTORY   Past Surgical History:  Procedure Laterality Date   BREAST LUMPECTOMY  1992   LEFT BREAST   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   FASCUCUTANEOUS GRAFT     FAMILY HISTORY   Family History  Problem Relation Age of Onset   CVA Mother  CAD Father    CVA Father    Heart attack Sister    Hypertension Brother      SOCIAL HISTORY   Social History   Tobacco Use   Smoking status: Never   Smokeless tobacco: Never  Substance Use Topics   Alcohol use: No    Alcohol/week: 0.0 standard drinks of alcohol   Drug use: No     MEDICATIONS   Current Medication:  Current Facility-Administered Medications:    acetaminophen (TYLENOL) tablet 650 mg, 650 mg, Oral, Q6H PRN, 650 mg at 02/19/23 1510 **OR** acetaminophen (TYLENOL) suppository 650 mg, 650 mg, Rectal, Q6H PRN, Nolberto Hanlon, MD   alum & mag hydroxide-simeth (MAALOX/MYLANTA) 200-200-20 MG/5ML suspension 30 mL, 30 mL, Oral, Q4H PRN, Amin, Ankit Chirag, MD   amiodarone (PACERONE) tablet 200 mg, 200 mg, Oral, Daily, Amin, Ankit Chirag, MD, 200 mg at 02/20/23 1022   apixaban (ELIQUIS) tablet 5 mg, 5 mg, Oral, BID, Amin, Ankit Chirag, MD, 5 mg at 02/20/23 1022   azithromycin (ZITHROMAX) 500 mg in sodium chloride 0.9 % 250 mL IVPB, 500 mg, Intravenous, Q24H, Nolberto Hanlon, MD, Last Rate: 250 mL/hr at 02/19/23 2125, 500 mg at 02/19/23 2125   cefTRIAXone (ROCEPHIN) 1 g in sodium chloride 0.9 % 100 mL IVPB, 1 g, Intravenous, Q24H, Nolberto Hanlon, MD, Last Rate: 200 mL/hr at 02/19/23 2235, 1 g at 02/19/23 2235   Chlorhexidine Gluconate Cloth 2 % PADS 6 each, 6 each, Topical, Daily, Nolberto Hanlon, MD, 6  each at 02/19/23 1123   furosemide (LASIX) injection 40 mg, 40 mg, Intravenous, BID, Amin, Ankit Chirag, MD, 40 mg at 02/20/23 0819   guaiFENesin (ROBITUSSIN) 100 MG/5ML liquid 5 mL, 5 mL, Oral, Q4H PRN, Amin, Ankit Chirag, MD, 5 mL at 02/19/23 1511   hydrALAZINE (APRESOLINE) injection 10 mg, 10 mg, Intravenous, Q4H PRN, Amin, Ankit Chirag, MD   hydrOXYzine (ATARAX) tablet 10 mg, 10 mg, Oral, TID PRN, Amin, Ankit Chirag, MD   insulin aspart (novoLOG) injection 0-5 Units, 0-5 Units, Subcutaneous, QHS, Nolberto Hanlon, MD   insulin aspart (novoLOG) injection 0-9 Units, 0-9 Units, Subcutaneous, TID WC, Nolberto Hanlon, MD, 1 Units at 02/19/23 1655   ipratropium-albuterol (DUONEB) 0.5-2.5 (3) MG/3ML nebulizer solution 3 mL, 3 mL, Nebulization, Q4H PRN, Amin, Ankit Chirag, MD   ipratropium-albuterol (DUONEB) 0.5-2.5 (3) MG/3ML nebulizer solution 3 mL, 3 mL, Nebulization, TID, Amin, Ankit Chirag, MD, 3 mL at 02/20/23 0730   lip balm (BLISTEX) ointment 1 Application, 1 Application, Topical, PRN, Amin, Ankit Chirag, MD   loratadine (CLARITIN) tablet 10 mg, 10 mg, Oral, Daily PRN, Amin, Ankit Chirag, MD   metoprolol succinate (TOPROL-XL) 24 hr tablet 12.5 mg, 12.5 mg, Oral, BID, Amin, Ankit Chirag, MD, 12.5 mg at 02/20/23 1022   Muscle Rub CREA 1 Application, 1 Application, Topical, PRN, Amin, Ankit Chirag, MD   pantoprazole (PROTONIX) injection 40 mg, 40 mg, Intravenous, Q24H, Nolberto Hanlon, MD, 40 mg at 02/19/23 2319   phenol (CHLORASEPTIC) mouth spray 1 spray, 1 spray, Mouth/Throat, PRN, Amin, Ankit Chirag, MD   polyvinyl alcohol (LIQUIFILM TEARS) 1.4 % ophthalmic solution 1 drop, 1 drop, Both Eyes, PRN, Amin, Ankit Chirag, MD   prochlorperazine (COMPAZINE) tablet 5 mg, 5 mg, Oral, Q8H PRN, Amin, Ankit Chirag, MD   senna-docusate (Senokot-S) tablet 1 tablet, 1 tablet, Oral, QHS PRN, Amin, Ankit Chirag, MD   sertraline (ZOLOFT) tablet 50 mg, 50 mg, Oral, Daily, Amin, Ankit Chirag, MD, 50 mg at 02/20/23 1022    sodium chloride (OCEAN)  0.65 % nasal spray 1 spray, 1 spray, Each Nare, PRN, Amin, Ankit Chirag, MD   sodium chloride flush (NS) 0.9 % injection 3 mL, 3 mL, Intravenous, Q12H, Nolberto Hanlon, MD, 3 mL at 02/20/23 1023   spironolactone (ALDACTONE) tablet 12.5 mg, 12.5 mg, Oral, Daily, Amin, Ankit Chirag, MD, 12.5 mg at 02/20/23 1023   traZODone (DESYREL) tablet 50 mg, 50 mg, Oral, QHS PRN, Dimple Nanas, MD, 50 mg at 02/19/23 2247    ALLERGIES   Diltiazem, Hydrochlorothiazide, Chlorthalidone, Pravastatin, Venlafaxine, Doxazosin, Enalapril, Losartan, and Mometasone    REVIEW OF SYSTEMS     10 point ROS unable to obtain due to encephalopathy  PHYSICAL EXAMINATION   Vital Signs: Temp:  [97.1 F (36.2 C)-98.5 F (36.9 C)] 98.5 F (36.9 C) (05/31 0800) Pulse Rate:  [37-117] 117 (05/31 1000) Resp:  [17-32] 23 (05/31 1000) BP: (88-134)/(44-96) 103/56 (05/31 1000) SpO2:  [86 %-97 %] 91 % (05/31 1000) FiO2 (%):  [60 %-100 %] 95 % (05/31 0800) Weight:  [60.8 kg] 60.8 kg (05/30 1106)  GENERAL:age appropriate mild distress HEAD: Normocephalic, atraumatic.  EYES: Pupils equal, round, reactive to light.  No scleral icterus.  MOUTH: Moist mucosal membrane. NECK: Supple. No thyromegaly. No nodules. No JVD.  PULMONARY: rhonchi bilaterally  CARDIOVASCULAR: S1 and S2. Regular rate and rhythm. No murmurs, rubs, or gallops.  GASTROINTESTINAL: Soft, nontender, non-distended. No masses. Positive bowel sounds. No hepatosplenomegaly.  MUSCULOSKELETAL: No swelling, clubbing, or edema.  NEUROLOGIC: Mild distress due to acute illness with encephalopathy SKIN:intact,warm,dry   PERTINENT DATA     Infusions:  azithromycin 500 mg (02/19/23 2125)   cefTRIAXone (ROCEPHIN)  IV 1 g (02/19/23 2235)   Scheduled Medications:  amiodarone  200 mg Oral Daily   apixaban  5 mg Oral BID   Chlorhexidine Gluconate Cloth  6 each Topical Daily   furosemide  40 mg Intravenous BID   insulin aspart  0-5  Units Subcutaneous QHS   insulin aspart  0-9 Units Subcutaneous TID WC   ipratropium-albuterol  3 mL Nebulization TID   metoprolol succinate  12.5 mg Oral BID   pantoprazole (PROTONIX) IV  40 mg Intravenous Q24H   sertraline  50 mg Oral Daily   sodium chloride flush  3 mL Intravenous Q12H   spironolactone  12.5 mg Oral Daily   PRN Medications: acetaminophen **OR** acetaminophen, alum & mag hydroxide-simeth, guaiFENesin, hydrALAZINE, hydrOXYzine, ipratropium-albuterol, lip balm, loratadine, Muscle Rub, phenol, polyvinyl alcohol, prochlorperazine, senna-docusate, sodium chloride, traZODone Hemodynamic parameters:   Intake/Output: 05/30 0701 - 05/31 0700 In: 1522.2 [P.O.:840; I.V.:3; IV Piggyback:679.2] Out: 1000 [Urine:1000]  Ventilator  Settings: FiO2 (%):  [60 %-100 %] 95 %     LAB RESULTS:  Basic Metabolic Panel: Recent Labs  Lab 02/08/2023 1959 02/19/23 0555 02/19/23 1007 02/20/23 0258  NA 133* 136  --  133*  K 2.9* 3.2*  --  4.2  CL 100 104  --  104  CO2 23 22  --  22  GLUCOSE 172* 127*  --  138*  BUN 13 15  --  22  CREATININE 0.81 0.83  --  0.91  CALCIUM 8.5* 8.1*  --  8.0*  MG 2.0  --   --  2.0  PHOS  --   --  3.7  --    Liver Function Tests: Recent Labs  Lab 02/01/2023 1959  AST 31  ALT 15  ALKPHOS 71  BILITOT 1.6*  PROT 6.8  ALBUMIN 3.5   No results for input(s): "LIPASE", "  AMYLASE" in the last 168 hours. No results for input(s): "AMMONIA" in the last 168 hours. CBC: Recent Labs  Lab 02/05/2023 1959 02/19/23 0555 02/20/23 0258  WBC 18.1* 21.1* 21.9*  NEUTROABS 16.3*  --   --   HGB 11.5* 10.4* 9.9*  HCT 34.9* 31.9* 30.3*  MCV 92.3 93.3 91.8  PLT 267 256 224   Cardiac Enzymes: No results for input(s): "CKTOTAL", "CKMB", "CKMBINDEX", "TROPONINI" in the last 168 hours. BNP: Invalid input(s): "POCBNP" CBG: Recent Labs  Lab 02/19/23 0750 02/19/23 1106 02/19/23 1622 02/19/23 2133 02/20/23 0732  GLUCAP 113* 114* 142* 171* 109*        IMAGING RESULTS:  Imaging: CT CHEST WO CONTRAST  Result Date: 02/19/2023 CLINICAL DATA:  Abnormal chest radiograph EXAM: CT CHEST WITHOUT CONTRAST TECHNIQUE: Multidetector CT imaging of the chest was performed following the standard protocol without IV contrast. RADIATION DOSE REDUCTION: This exam was performed according to the departmental dose-optimization program which includes automated exposure control, adjustment of the mA and/or kV according to patient size and/or use of iterative reconstruction technique. COMPARISON:  Chest x-ray dated Feb 18, 2023 FINDINGS: Cardiovascular: Cardiomegaly. No pericardial effusion. Normal caliber thoracic aorta with severe calcified plaque. Severe three-vessel coronary artery calcifications. Right chest wall dual lead pacer with leads in the right ventricle and right atrium. Mediastinum/Nodes: Esophagus and thyroid are unremarkable. No enlarged lymph nodes seen in the chest. Lungs/Pleura: Central airways are patent. Evaluation of the lungs is somewhat limited due to respiratory motion artifact. Central predominant ground-glass opacities with scattered more confluent areas of consolidation and associated septal thickening. Moderate right and small left pleural effusions and bibasilar atelectasis. Upper Abdomen: Scattered low-attenuation liver lesions which are likely simple cysts. No acute abnormality. Musculoskeletal: No chest wall mass or suspicious bone lesions identified. IMPRESSION: 1. Central predominant ground-glass opacities with scattered more confluent areas of consolidation and associated septal thickening, findings are likely due to pulmonary edema. 2. Moderate right and small left pleural effusions. 3. Cardiomegaly. 4. Severe coronary artery and aortic atherosclerosis (ICD10-I70.0). Electronically Signed   By: Allegra Lai M.D.   On: 02/19/2023 11:25   DG Chest 1 View  Result Date: 02/03/2023 CLINICAL DATA:  Hypoxia EXAM: CHEST  1 VIEW  COMPARISON:  08/05/2022 FINDINGS: Transverse diameter of heart is increased. Central pulmonary vessels are prominent. Increased interstitial and alveolar markings are seen in both parahilar regions, more so on the right side. Increased interstitial and alveolar markings are seen in the lower lung fields, more so on the right side. There is blunting of lateral CP angles. There is no pneumothorax. Pacemaker battery is seen in the right infraclavicular region. Surgical clips are seen in left chest wall. IMPRESSION: Cardiomegaly. Extensive new interstitial and alveolar densities are seen in the parahilar regions and lower lung fields, more so on the right side. Findings suggest possible pulmonary edema or extensive bilateral multifocal pneumonia. Electronically Signed   By: Ernie Avena M.D.   On: 01/23/2023 21:08   @PROBHOSP @ CT CHEST WO CONTRAST  Result Date: 02/19/2023 CLINICAL DATA:  Abnormal chest radiograph EXAM: CT CHEST WITHOUT CONTRAST TECHNIQUE: Multidetector CT imaging of the chest was performed following the standard protocol without IV contrast. RADIATION DOSE REDUCTION: This exam was performed according to the departmental dose-optimization program which includes automated exposure control, adjustment of the mA and/or kV according to patient size and/or use of iterative reconstruction technique. COMPARISON:  Chest x-ray dated Feb 18, 2023 FINDINGS: Cardiovascular: Cardiomegaly. No pericardial effusion. Normal caliber thoracic aorta with severe  calcified plaque. Severe three-vessel coronary artery calcifications. Right chest wall dual lead pacer with leads in the right ventricle and right atrium. Mediastinum/Nodes: Esophagus and thyroid are unremarkable. No enlarged lymph nodes seen in the chest. Lungs/Pleura: Central airways are patent. Evaluation of the lungs is somewhat limited due to respiratory motion artifact. Central predominant ground-glass opacities with scattered more confluent areas of  consolidation and associated septal thickening. Moderate right and small left pleural effusions and bibasilar atelectasis. Upper Abdomen: Scattered low-attenuation liver lesions which are likely simple cysts. No acute abnormality. Musculoskeletal: No chest wall mass or suspicious bone lesions identified. IMPRESSION: 1. Central predominant ground-glass opacities with scattered more confluent areas of consolidation and associated septal thickening, findings are likely due to pulmonary edema. 2. Moderate right and small left pleural effusions. 3. Cardiomegaly. 4. Severe coronary artery and aortic atherosclerosis (ICD10-I70.0). Electronically Signed   By: Allegra Lai M.D.   On: 02/19/2023 11:25        ASSESSMENT AND PLAN    -Multidisciplinary rounds held today  Acute Hypoxic Respiratory Failure -due to bilateral effusions with compressive atelectasis and interstitial edema - suspect cardiac etiology due to elevated cardiac biomarkers however cannot rule out sepsis due to + blood cultures with strep and also on differential is progressive cancer with malignant effusion. -Diagnostic and therapeutic thoracentesis -  -patient is DNR - palliative care is following -continue Bronchodilator Therapy   CARDIAC FAILURE -oxygen as needed -Lasix as tolerated -follow up cardiac enzymes as indicated ICU monitoring    ID -continue IV abx as prescibed -follow up cultures  GI/Nutrition GI PROPHYLAXIS as indicated DIET-->TF's as tolerated Constipation protocol as indicated  ENDO - ICU hypoglycemic\Hyperglycemia protocol -check FSBS per protocol   ELECTROLYTES -follow labs as needed -replace as needed -pharmacy consultation   DVT/GI PRX ordered -SCDs  TRANSFUSIONS AS NEEDED MONITOR FSBS ASSESS the need for LABS as needed  Critical care provider statement:   Total critical care time: 33 minutes   Performed by: Karna Christmas MD   Critical care time was exclusive of separately  billable procedures and treating other patients.   Critical care was necessary to treat or prevent imminent or life-threatening deterioration.   Critical care was time spent personally by me on the following activities: development of treatment plan with patient and/or surrogate as well as nursing, discussions with consultants, evaluation of patient's response to treatment, examination of patient, obtaining history from patient or surrogate, ordering and performing treatments and interventions, ordering and review of laboratory studies, ordering and review of radiographic studies, pulse oximetry and re-evaluation of patient's condition.       This document was prepared using Dragon voice recognition software and may include unintentional dictation errors.    Vida Rigger, M.D.  Division of Pulmonary & Critical Care Medicine  [facility]

## 2023-02-20 NOTE — Progress Notes (Signed)
PHARMACY - PHYSICIAN COMMUNICATION CRITICAL VALUE ALERT - BLOOD CULTURE IDENTIFICATION (BCID)  Results for orders placed or performed during the hospital encounter of 01/24/2023  SARS Coronavirus 2 by RT PCR (hospital order, performed in [facility] hospital lab) *cepheid single result test* Anterior Nasal Swab     Status: None   Collection Time: 02/02/2023  7:59 PM   Specimen: Anterior Nasal Swab  Result Value Ref Range Status   SARS Coronavirus 2 by RT PCR NEGATIVE NEGATIVE Final    Comment: (NOTE) SARS-CoV-2 target nucleic acids are NOT DETECTED.  The SARS-CoV-2 RNA is generally detectable in upper and lower respiratory specimens during the acute phase of infection. The lowest concentration of SARS-CoV-2 viral copies this assay can detect is 250 copies / mL. A negative result does not preclude SARS-CoV-2 infection and should not be used as the sole basis for treatment or other patient management decisions.  A negative result may occur with improper specimen collection / handling, submission of specimen other than nasopharyngeal swab, presence of viral mutation(s) within the areas targeted by this assay, and inadequate number of viral copies (<250 copies / mL). A negative result must be combined with clinical observations, patient history, and epidemiological information.  Fact Sheet for Patients:   RoadLapTop.co.za  Fact Sheet for Healthcare Providers: http://kim-miller.com/  This test is not yet approved or  cleared by the [place] FDA and has been authorized for detection and/or diagnosis of SARS-CoV-2 by FDA under an Emergency Use Authorization (EUA).  This EUA will remain in effect (meaning this test can be used) for the duration of the COVID-19 declaration under Section 564(b)(1) of the Act, 21 U.S.C. section 360bbb-3(b)(1), unless the authorization is terminated or revoked sooner.  Performed at [facility], [address] [place], [place] [address]   Culture, blood (Routine X 2) w Reflex to ID Panel     Status: None (Preliminary result)   Collection Time: 02/09/2023  7:59 PM   Specimen: BLOOD RIGHT ARM  Result Value Ref Range Status   Specimen Description BLOOD RIGHT ARM  Final   Special Requests   Final    BOTTLES DRAWN AEROBIC AND ANAEROBIC Blood Culture adequate volume   Culture  Setup Time   Final    GRAM POSITIVE COCCI ANAEROBIC BOTTLE ONLY Organism ID to follow CRITICAL RESULT CALLED TO, READ BACK BY AND VERIFIED WITH: Elenora Gamma, PHARMD AT 1128 ON 02/19/23 BY GM Performed at [facility], [address] [place], [place] [address]    Culture GRAM POSITIVE COCCI  Final   Report Status PENDING  Incomplete  Blood Culture ID Panel (Reflexed)     Status: Abnormal   Collection Time: 01/23/2023  7:59 PM  Result Value Ref Range Status   Enterococcus faecalis NOT DETECTED NOT DETECTED Final   Enterococcus Faecium NOT DETECTED NOT DETECTED Final   Listeria monocytogenes NOT DETECTED NOT DETECTED Final   Staphylococcus species DETECTED (A) NOT DETECTED Final    Comment: CRITICAL RESULT CALLED TO, READ BACK BY AND VERIFIED WITH: Khi Mcmillen BLUE, PHARMD AT 1128 ON 02/19/23 BY GM    Staphylococcus aureus (BCID) NOT DETECTED NOT DETECTED Final   Staphylococcus epidermidis NOT DETECTED NOT DETECTED Final   Staphylococcus lugdunensis NOT DETECTED NOT DETECTED Final   Streptococcus species NOT DETECTED NOT DETECTED Final   Streptococcus agalactiae NOT DETECTED NOT DETECTED Final   Streptococcus pneumoniae NOT DETECTED NOT DETECTED Final   Streptococcus pyogenes NOT DETECTED NOT DETECTED Final   A.calcoaceticus-baumannii NOT  DETECTED NOT DETECTED Final   Bacteroides fragilis NOT DETECTED NOT DETECTED Final   Enterobacterales NOT DETECTED NOT DETECTED Final   Enterobacter cloacae complex NOT DETECTED NOT DETECTED Final   Escherichia coli NOT DETECTED NOT DETECTED Final   Klebsiella aerogenes  NOT DETECTED NOT DETECTED Final   Klebsiella oxytoca NOT DETECTED NOT DETECTED Final   Klebsiella pneumoniae NOT DETECTED NOT DETECTED Final   Proteus species NOT DETECTED NOT DETECTED Final   Salmonella species NOT DETECTED NOT DETECTED Final   Serratia marcescens NOT DETECTED NOT DETECTED Final   Haemophilus influenzae NOT DETECTED NOT DETECTED Final   Neisseria meningitidis NOT DETECTED NOT DETECTED Final   Pseudomonas aeruginosa NOT DETECTED NOT DETECTED Final   Stenotrophomonas maltophilia NOT DETECTED NOT DETECTED Final   Candida albicans NOT DETECTED NOT DETECTED Final   Candida auris NOT DETECTED NOT DETECTED Final   Candida glabrata NOT DETECTED NOT DETECTED Final   Candida krusei NOT DETECTED NOT DETECTED Final   Candida parapsilosis NOT DETECTED NOT DETECTED Final   Candida tropicalis NOT DETECTED NOT DETECTED Final   Cryptococcus neoformans/gattii NOT DETECTED NOT DETECTED Final    Comment: Performed at [facility], [address] [place], [place] [address]  Culture, blood (Routine X 2) w Reflex to ID Panel     Status: None (Preliminary result)   Collection Time: 02/19/2023 10:29 PM   Specimen: BLOOD  Result Value Ref Range Status   Specimen Description BLOOD HAND  Final   Special Requests   Final    BOTTLES DRAWN AEROBIC AND ANAEROBIC Blood Culture adequate volume   Culture   Final    NO GROWTH < 12 HOURS Performed at [facility], [address] [place], [place] [address]    Report Status PENDING  Incomplete  MRSA Next Gen by PCR, Nasal     Status: None   Collection Time: 02/19/23 11:15 AM   Specimen: Nasal Mucosa; Nasal Swab  Result Value Ref Range Status   MRSA by PCR Next Gen NOT DETECTED NOT DETECTED Final    Comment: (NOTE) The GeneXpert MRSA Assay (FDA approved for NASAL specimens only), is one component of a comprehensive MRSA colonization surveillance program. It is not intended to diagnose MRSA infection nor to guide or monitor  treatment for MRSA infections. Test performance is not FDA approved in patients less than 54 years old. Performed at [facility], [address] [place], [place] [address]   Respiratory (~20 pathogens) panel by PCR     Status: None   Collection Time: 02/19/23 11:26 AM   Specimen: Anterior Nasal Swab; Respiratory  Result Value Ref Range Status   Adenovirus NOT DETECTED NOT DETECTED Final   Coronavirus 229E NOT DETECTED NOT DETECTED Final    Comment: (NOTE) The Coronavirus on the Respiratory Panel, DOES NOT test for the novel  Coronavirus (2019 nCoV)    Coronavirus HKU1 NOT DETECTED NOT DETECTED Final   Coronavirus NL63 NOT DETECTED NOT DETECTED Final   Coronavirus OC43 NOT DETECTED NOT DETECTED Final   Metapneumovirus NOT DETECTED NOT DETECTED Final   Rhinovirus / Enterovirus NOT DETECTED NOT DETECTED Final   Influenza A NOT DETECTED NOT DETECTED Final   Influenza B NOT DETECTED NOT DETECTED Final   Parainfluenza Virus 1 NOT DETECTED NOT DETECTED Final   Parainfluenza Virus 2 NOT DETECTED NOT DETECTED Final   Parainfluenza Virus 3 NOT DETECTED NOT DETECTED Final   Parainfluenza Virus 4 NOT DETECTED NOT DETECTED Final   Respiratory Syncytial  Virus NOT DETECTED NOT DETECTED Final   Bordetella pertussis NOT DETECTED NOT DETECTED Final   Bordetella Parapertussis NOT DETECTED NOT DETECTED Final   Chlamydophila pneumoniae NOT DETECTED NOT DETECTED Final   Mycoplasma pneumoniae NOT DETECTED NOT DETECTED Final    Comment: Performed at [facility], [address], [place], [place] [address]    BCID Results: 1 (anaerobic) of 4 bottles with Staph Species, no resistance.  Pt currently on Azithromycin & Ceftriaxone.  Name of provider contacted: Doylene Canard, MD   Changes to prescribed antibiotics required: No changes at this time pending additional cx results.  Otelia Sergeant, PharmD, [facility] 02/20/2023 12:22 AM

## 2023-02-20 NOTE — Progress Notes (Signed)
GOC discussed with the daughter and niece at bedside.  Thoracentesis yielded 450cc.  Patient remains critically ill.  They agree patient should be DNR/DNI. Form previously signed in the chart. Patient agreeable.   If continues to worsen, she should be transitioned to comfort care but regardless should have palliative care discussion for now. Will place consult for palliative care.   Rhoderick Farrel

## 2023-02-20 NOTE — Progress Notes (Addendum)
BRIEF PCCM NOTE  Pt was seen earlier by [facility] provider Dr. Karna Christmas.  Please see his Consult Note for full assessment & plan.  BRIEF PT DESCRIPTION / SYNOPSIS :  89 year old with past medical history significant for lung cancer with mets follows at [facility] oncology, atrial fibrillation on Eliquis, HTN, HLD who presented to the hospital with complaints of progressive dyspnea on exertion and shortness of breath. She is admitted with Acute Hypoxic Respiratory Failure due to Acute Decompensated HFpEF, Bilateral Pleural Effusions, compressive atelectasis, and questionable Multifocal Community Acquired Pneumonia, intermittently requiring BiPAP.    SUBJECTIVE / INTERVAL HISTORY :  -Rounded on pt at beginning of shift ~ she was sleeping but aroused easily to voice and oriented to person and place, answering questions appropriately ~ complained of nonproductive cough and nausea -Asked to come to bedside by nursing as she was minimally responsive ~ pt had removed HHFNC and was hypoxic with O2 sats in the 60's ~ she was placed back on HHFNC but taking long time to recover ~ subsequently placed back on BiPAP -Following BiPAP O2 sats improved to mid 90's (95-96%), and pt starting to arouse and interact -Will hypoxia and increased of breathing, noted to be in A.fib with RVR (HR 120-130) -Pt is chronically in A.fib on Eliquis and has been on Eliquis during hospitalization ~ will give Amiodarone 150 mg bolus and start on Amiodarone drip (will d/c PO Amiodarone given BiPAP) -Also will trial morphine 1 mg x1 dose for WOB/air hunger -Per previous GOC discussion this afternoon ~ pt is DNR/DNI, and if she were to decline further, would transition to COMFORT -Will trial these interventions, but if she fails to improve, will contact family about transitioning to Comfort measures sooner than later ~ Palliative Care consulted   OBJECTIVE :   Today's Vitals   02/20/23 1400 02/20/23 1600 02/20/23 1900 02/20/23  1915  BP: 105/62 108/64 104/72   Pulse: 89 (!) 108 (!) 123 (!) 112  Resp: (!) 21 (!) 30 (!) 34 (!) 38  Temp:   98.4 F (36.9 C)   TempSrc:   Oral   SpO2: 95% 96% 91% (!) 84%  Weight:      Height:      PainSc:       Body mass index is 22.31 kg/m.   ASSESSMENT / PLAN :   #Atrial Fibrillation w/ RVR, suspect being driven by Acute Hypoxic Respiratory Failure #Acute Decompensated HFpEF PMHx: Atrial fibrillation on Eliquis, HTN Echocardiogram 04/03/22: LVEF 50-55%, mild LVH, indeterminate diastolic parameters, RV systolic function normal, mildly elevated pulmonary artery systolic pressure, mild to moderate MR -Continuous cardiac monitoring -Maintain MAP >65 -Vasopressors as needed to maintain MAP goal ~ currently not requiring -Trend HS Troponin until peaked -Check electrolytes and TSH/thyroid panel -Obtain Echocardiogram  -Amiodarone bolus and gtt (will d/c PO Amiodarone for now given BiPAP) -Continue Eliquis for anticoagulation, may need to transition to Heparin gtt if continues to require BiPAP through tomorrow -Diuresis as BP and renal function permits ~ currently on 40 mg IV Lasix BID and Spironolactone -Consider Cardiology consult  #Acute Hypoxic Respiratory Failure in the setting of Acute Decompensated HFpEF, Bilateral Pleural Effusions, compressive atelectasis, and questionable Multifocal Community Acquired Pneumonia #RUL Lung Adenocarcinoma Stage II S/p right Thoracentesis 5/31 yielding 450 cc of hazy amber fluid ~ post procedure CXR without PTX -Supplemental O2 as needed to maintain O2 sats >92% -BiPAP, wean as tolerated (pt is DNR/DNI) -Follow intermittent Chest X-ray & ABG as needed -Bronchodilators  -ABX  as above (Azithromycin & Ceftriaxone) -Diuresis as BP and renal function permits ~ currently on Lasix 40 mg BID and Spironolactone -Pulmonary toilet as able -Trial 1 mg Morphine x1 dose for air hunger       Pt is critically ill and prognosis is extremely  guarded, high risk for further decompensation, cardiac arrest and death.  Pt is DNR/DNI.  Per previous GOC discussions, it pt were to continue to decline they would likely transition to COMFORT MEASURES.  Palliative Care has been consulted.   Additional Critical Care Time: 30 minutes  Harlon Ditty, AGACNP-BC [facility] Prefer epic messenger for cross cover needs If after hours, please call E-link

## 2023-02-20 NOTE — Plan of Care (Signed)
?  Problem: Clinical Measurements: ?Goal: Ability to maintain a body temperature in the normal range will improve ?Outcome: Progressing ?  ?Problem: Respiratory: ?Goal: Ability to maintain adequate ventilation will improve ?Outcome: Progressing ?  ?Problem: Respiratory: ?Goal: Ability to maintain a clear airway will improve ?Outcome: Progressing ?  ?

## 2023-02-21 ENCOUNTER — Inpatient Hospital Stay (HOSPITAL_COMMUNITY)
Admit: 2023-02-21 | Discharge: 2023-02-21 | Disposition: A | Discharge status: Home / Self Care | Payer: Medicare HMO | Attending: Pulmonary Disease | Admitting: Pulmonary Disease

## 2023-02-21 ENCOUNTER — Inpatient Hospital Stay: Payer: Medicare HMO

## 2023-02-21 DIAGNOSIS — Z7189 Other specified counseling: Secondary | ICD-10-CM

## 2023-02-21 DIAGNOSIS — I5021 Acute systolic (congestive) heart failure: Secondary | ICD-10-CM

## 2023-02-21 DIAGNOSIS — C3490 Malignant neoplasm of unspecified part of unspecified bronchus or lung: Secondary | ICD-10-CM | POA: Diagnosis present

## 2023-02-21 DIAGNOSIS — J189 Pneumonia, unspecified organism: Secondary | ICD-10-CM

## 2023-02-21 DIAGNOSIS — I5031 Acute diastolic (congestive) heart failure: Secondary | ICD-10-CM | POA: Diagnosis not present

## 2023-02-21 DIAGNOSIS — J9601 Acute respiratory failure with hypoxia: Secondary | ICD-10-CM | POA: Diagnosis not present

## 2023-02-21 DIAGNOSIS — I10 Essential (primary) hypertension: Secondary | ICD-10-CM | POA: Diagnosis not present

## 2023-02-21 DIAGNOSIS — J810 Acute pulmonary edema: Secondary | ICD-10-CM | POA: Diagnosis not present

## 2023-02-21 DIAGNOSIS — I482 Chronic atrial fibrillation, unspecified: Secondary | ICD-10-CM

## 2023-02-21 DIAGNOSIS — Z515 Encounter for palliative care: Secondary | ICD-10-CM

## 2023-02-21 LAB — ECHOCARDIOGRAM COMPLETE
AR max vel: 1.65 cm2
AV Peak grad: 4.3 mmHg
Ao pk vel: 1.04 m/s
Area-P 1/2: 5.27 cm2
Calc EF: 31.5 %
Height: 65 in
MV M vel: 4.56 m/s
MV Peak grad: 83.2 mmHg
Radius: 0.6 cm
S' Lateral: 3.8 cm
Single Plane A2C EF: 38.6 %
Single Plane A4C EF: 22.6 %
Weight: 2144.63 oz

## 2023-02-21 LAB — CBC
HCT: 31.4 % — ABNORMAL LOW (ref 36.0–46.0)
Hemoglobin: 10.2 g/dL — ABNORMAL LOW (ref 12.0–15.0)
MCH: 30.2 pg (ref 26.0–34.0)
MCHC: 32.5 g/dL (ref 30.0–36.0)
MCV: 92.9 fL (ref 80.0–100.0)
Platelets: 246 10*3/uL (ref 150–400)
RBC: 3.38 MIL/uL — ABNORMAL LOW (ref 3.87–5.11)
RDW: 13.8 % (ref 11.5–15.5)
WBC: 21.5 10*3/uL — ABNORMAL HIGH (ref 4.0–10.5)
nRBC: 0 % (ref 0.0–0.2)

## 2023-02-21 LAB — PROCALCITONIN: Procalcitonin: 1.77 ng/mL

## 2023-02-21 LAB — CULTURE, BLOOD (ROUTINE X 2): Special Requests: ADEQUATE

## 2023-02-21 LAB — BASIC METABOLIC PANEL
Anion gap: 8 (ref 5–15)
BUN: 22 mg/dL (ref 8–23)
CO2: 25 mmol/L (ref 22–32)
Calcium: 8 mg/dL — ABNORMAL LOW (ref 8.9–10.3)
Chloride: 99 mmol/L (ref 98–111)
Creatinine, Ser: 0.84 mg/dL (ref 0.44–1.00)
GFR, Estimated: >60 mL/min (ref >60)
Glucose, Bld: 146 mg/dL — ABNORMAL HIGH (ref 70–99)
Potassium: 3.7 mmol/L (ref 3.5–5.1)
Sodium: 132 mmol/L — ABNORMAL LOW (ref 135–145)

## 2023-02-21 LAB — GLUCOSE, CAPILLARY
Glucose-Capillary: 123 mg/dL — ABNORMAL HIGH (ref 70–99)
Glucose-Capillary: 110 mg/dL — ABNORMAL HIGH (ref 70–99)

## 2023-02-21 LAB — MAGNESIUM: Magnesium: 1.9 mg/dL (ref 1.7–2.4)

## 2023-02-21 LAB — PHOSPHORUS: Phosphorus: 3 mg/dL (ref 2.5–4.6)

## 2023-02-21 LAB — BODY FLUID CULTURE W GRAM STAIN

## 2023-02-21 LAB — TROPONIN I (HIGH SENSITIVITY): Troponin I (High Sensitivity): 70 ng/L — ABNORMAL HIGH (ref <18)

## 2023-02-21 MED ORDER — MORPHINE SULFATE (PF) 2 MG/ML IV SOLN
1.0000 mg | INTRAVENOUS | Status: DC | PRN
  Filled 2023-02-21: qty 1

## 2023-02-21 MED ORDER — LORAZEPAM 2 MG/ML IJ SOLN
1.0000 mg | INTRAMUSCULAR | Status: DC | PRN
  Administered 2023-02-21 – 2023-02-22 (×2): 1 mg via INTRAVENOUS
  Filled 2023-02-21 (×2): qty 1

## 2023-02-21 MED ORDER — SODIUM CHLORIDE 0.9 % IV SOLN
2.0000 g | Freq: Two times a day (BID) | INTRAVENOUS | Status: DC
  Filled 2023-02-21: qty 12.5

## 2023-02-21 MED ORDER — IPRATROPIUM BROMIDE 0.02 % IN SOLN
0.5000 mg | Freq: Three times a day (TID) | RESPIRATORY_TRACT | Status: DC
  Administered 2023-02-21 – 2023-02-22 (×3): 0.5 mg via RESPIRATORY_TRACT
  Filled 2023-02-21 (×3): qty 2.5

## 2023-02-21 MED ORDER — SODIUM CHLORIDE 0.9% FLUSH: 10.0000 mL | INTRAVENOUS | Status: DC | PRN

## 2023-02-21 MED ORDER — HYALURONIDASE HUMAN 150 UNIT/ML IJ SOLN
750.0000 [IU] | Freq: Once | INTRAMUSCULAR | Status: DC
  Filled 2023-02-21: qty 5

## 2023-02-21 MED ORDER — ORAL CARE MOUTH RINSE: 15.0000 mL | OROMUCOSAL | Status: DC | PRN

## 2023-02-21 MED ORDER — CALCIUM GLUCONATE 10 % IV SOLN
1.0000 g | Freq: Once | INTRAVENOUS | Status: AC
  Administered 2023-02-21: 1 g via INTRAVENOUS
  Filled 2023-02-21: qty 10

## 2023-02-21 MED ORDER — ORAL CARE MOUTH RINSE
15.0000 mL | OROMUCOSAL | Status: DC
  Administered 2023-02-21 – 2023-02-22 (×5): 15 mL via OROMUCOSAL

## 2023-02-21 MED ORDER — POTASSIUM CHLORIDE CRYS ER 20 MEQ PO TBCR
20.0000 meq | EXTENDED_RELEASE_TABLET | Freq: Once | ORAL | Status: AC
  Administered 2023-02-21: 20 meq via ORAL
  Filled 2023-02-21: qty 1

## 2023-02-21 MED ORDER — HYALURONIDASE HUMAN 150 UNIT/ML IJ SOLN
150.0000 [IU] | Freq: Once | INTRAMUSCULAR | Status: AC
  Administered 2023-02-21: 150 [IU] via SUBCUTANEOUS
  Filled 2023-02-21: qty 1

## 2023-02-21 MED ORDER — DIGOXIN 0.25 MG/ML IJ SOLN: 500.0000 ug | Freq: Once | INTRAMUSCULAR | Status: DC

## 2023-02-21 MED ORDER — SODIUM CHLORIDE 0.9% FLUSH
10.0000 mL | Freq: Two times a day (BID) | INTRAVENOUS | Status: DC
  Administered 2023-02-21 – 2023-02-22 (×3): 10 mL

## 2023-02-21 MED ORDER — DIGOXIN 0.25 MG/ML IJ SOLN
0.5000 mg | Freq: Once | INTRAMUSCULAR | Status: AC
  Administered 2023-02-21: 0.5 mg via INTRAVENOUS

## 2023-02-21 NOTE — Consult Note (Signed)
Consultation Note Date: 02/21/2023   Patient Name: Daisy Parks  DOB: 1932-07-20  MRN: [id number]  Age / Sex: 89 y.o., female  PCP: Daisy Bushman, MD Referring Physician: Arnetha Courser, MD  Reason for Consultation: Establishing goals of care   HPI/Brief Hospital Course: 89 y.o. female  with past medical history of lung cancer with metastasis followed by [facility] oncology, atrial fibrillation on Eliquis, HTN and HLD admitted from home on 02/06/2023 with progressive dyspnea on exertion and shortness of breath.    Found to have bilateral pleural effusions with interstitial edema and hypotension Underwent US guided thoracentesis 5/31 for which 450cc fluid was removed  5/31 overnight events: period of severe hypoxia secondary to her removal of HFNC and development of A. Fib with RVR Intermittently placed on BIPAP and started on Amiodarone infusion  Palliative medicine was consulted for assisting with goals of care conversations.  Subjective:  Extensive chart review has been completed prior to meeting patient including labs, vital signs, imaging, progress notes, orders, and available advanced directive documents from current and previous encounters.  Visited bedside earlier this AM with Daisy Parks, son-Daisy Parks at bedside, receiving updates from Dr. Karna Christmas. Daisy Parks shared his sister and other relatives planning to return to bedside later in the morning,  Returned to bedside once family arrived. Daisy Parks shared she understands the severity of her disease and she is "tired" but worried about leaving her family. She agreed to me meeting with her family and discussing goals of care. Met with family outside of CCU in family conference room.  Introduced myself as a Publishing rights manager as a member of the palliative care team. Explained palliative medicine is specialized medical care for people living with serious illness. It focuses on providing relief from the symptoms  and stress of a serious illness. The goal is to improve quality of life for both the patient and the family.   Multiple family members present including daughter-Daisy Parks, son-Daisy Parks, granddaughter and several nieces. Daisy Parks requested family present to hear from Dr. A-Dr. Mervyn Parks available via speaker phone and provided family with updates on current medical condition. He shared his concern for her ongoing signs of dyspnea and distress related to multiple underlying chronic conditions.  All family present shared their concern for preventing ongoing suffering and pain. All family present interested in speaking about comfort care-they share their goal would be to have Daisy Parks at home and pass peacefully with her family surrounding her.  Daisy Mouse, RN with [facility] also present during meeting to discuss Hospice eligibility and criteria.  We discussed comfort care, Daisy Parks would no longer receive aggressive medical interventions such as continuous vital signs, lab work, radiology testing, or medications not focused on comfort. All care would focus on how the patient is looking and feeling. This would include management of any symptoms that may cause discomfort, pain, shortness of breath, cough, nausea, agitation, anxiety, and/or secretions etc. Symptoms would be managed with medications and other non-pharmacological interventions such as spiritual support if requested, repositioning, music therapy, or therapeutic listening. Family verbalized understanding and appreciation.   I and Daisy Parks shared our concern due to Daisy Parks's need for high levels of oxygen therapy transport to home or IPU would not be safe at this time. Also, IPU not able to provide the oxygen requirements she is needing at this time.  Family verbalized understanding. All family present wish to proceed with transitioning to comfort care. They request time for family/friends to visit. I recommended initiating comfort medications as  needed to ensure comfort  avoiding pain and suffering with continuing care as is until family/friends able to visit. NO escalation of care from this point. If there is a noticeable decline or deterioration, at that time all measures would be discontinued and all focus would be on providing comfort. Family in agreement.  Per family request, shared our discussions with Daisy Parks for which she agreed to comfort plan of care and voiced her appreciation for having her family involved and being able to spend the time she has left with them surrounding her.   All questions/concerns addressed. Emotional support provided to patient/family/support persons. PMT will continue to follow and support patient as needed.   Objective: Primary Diagnoses: Present on Admission:  Acute respiratory failure with hypoxia (HCC)  Community acquired pneumonia  Hypokalemia  Acute heart failure with preserved ejection fraction (HFpEF) (HCC)  Chronic atrial fibrillation with RVR (HCC)  Essential hypertension  Primary lung adenocarcinoma (HCC)   Vital Signs: BP 119/71   Pulse (!) 120   Temp 98.2 F (36.8 C) (Axillary)   Resp (!) 23   Ht 5\' 5"  (1.651 m)   Wt 60.8 kg   SpO2 (!) 84%   BMI 22.31 kg/m  Pain Scale: 0-10   Pain Score: 0-No pain  IO: Intake/output summary:  Intake/Output Summary (Last 24 hours) at 02/21/2023 1548 Last data filed at 02/21/2023 1214 Gross per 24 hour  Intake 512.77 ml  Output 550 ml  Net -37.23 ml    LBM: Last BM Date : 02/15/23 Baseline Weight: Weight: 60.8 kg (From O/V note 02/05/23) Most recent weight: Weight: 60.8 kg      Assessment and Plan  SUMMARY OF RECOMMENDATIONS   DNR/DNI Comfort measures: Initiation of comfort medications including Morphine 1-2 mg IV q1h PRN for pain, SHOB, increased WOB, Lorazepam 1 mg IV q4h PRN for anxiety D/c antibiotics due to IV access with family agreement Continue all other measures without escalation of care until family/friends able to visit-anticipate full  transition in 24 hours PMT to continue to follow for ongoing needs and support  Discussed With: CCM team, primary team and nursing staff   Thank you for this consult and allowing Palliative Medicine to participate in the care of Anayely A. Jolin. Palliative medicine will continue to follow and assist as needed.   Time Total: 75 minutes  Time spent includes: Detailed review of medical records (labs, imaging, vital signs), medically appropriate exam (mental status, respiratory, cardiac, skin), discussed with treatment team, counseling and educating patient, family and staff, documenting clinical information, medication management and coordination of care.   Signed by: Leeanne Deed, DNP, AGNP-C Palliative Medicine    Please contact Palliative Medicine Team phone at [phone] for questions and concerns.  For individual provider: See Loretha Stapler

## 2023-02-21 NOTE — Hospital Course (Signed)
Taken from prior notes.  89 year old with history of lung cancer with mets follows at [facility] oncology, atrial fibrillation on Eliquis, HTN, HLD comes to the hospital with complaints of progressive dyspnea on exertion and shortness of breath.  Upon admission found to have pneumonia started on IV antibiotics and required BiPAP.  Patient was thought to be in volume overload requiring IV Lasix.  Slowly being transition to high flow nasal cannula.  Pulmonary consulted and patient underwent thoracentesis with removal of 450 cc fluid, preliminary cultures negative.  Patient continued to have worsening respiratory status.  Did required BiPAP and later transitioned to heated high flow at 100% FiO2 and 60 L of oxygen.  She was also have A-fib with RVR, started on amiodarone infusion.  Patient is DNR/DNI and if continued to get worse then most likely be transition to comfort care.  Palliative care was also consulted with meeting scheduled for 02/21/2023.  6/1: Patient remained on 100% FiO2 and 60 L of oxygen and barely saturating in low to mid 80s.  Multiple family members at bedside.  Remained in RVR with softer blood pressure. Worsening leukocytosis.  Palliative care meeting later today.  6/2: Patient remained on maximum setting of heated high flow.  Saturating barely in 80s.  Family decided to transition her to full comfort care.

## 2023-02-21 NOTE — Assessment & Plan Note (Signed)
Patient remained in RVR.  Cardiology also started digoxin. -Continue with amiodarone and Toprol -Continue with Eliquis

## 2023-02-21 NOTE — Progress Notes (Signed)
Civil engineer, contracting Hospice Liaison Note  Received notification from Leeanne Deed, NP with Palliative Medicine Team regarding Daisy Parks and that family meeting to occur when family arrived and they have expressed an interest in hospice care and had questions related to options.  Notified Susa Simmonds, SW of family request for informational session.  Joint meeting held with Leeanne Deed, NP PMT and multiple family members.  Dr. Leone Haven was conference called in to give a summary on patient's current health status and prognosis.  Family was given opportunity to have all questions asked and answered by Dr. Nelson Chimes and PMT.  Family expressed wanting to proceed with comfort measures.  They are in agreement with giving Daisy Parks medications to help ease air hunger and express goal is comfort for her and to not to not prolong her suffering.  They would like to wait to wean her oxygen down until family members are able to visit if possible.    At this time, patient is too unstable for discharge.  Hospice will follow peripherally through final disposition.  Family given my card for contact if they have questions regarding hospice.    Thank you for allowing participation in this patient's care.  Norris Cross, RN Nurse Liaison [phone]

## 2023-02-21 NOTE — Assessment & Plan Note (Signed)
Worsening procalcitonin at 1.77. -Switch ceftriaxone with cefepime -Continue with supportive care

## 2023-02-21 NOTE — Consult Note (Addendum)
PHARMACY CONSULT NOTE  Pharmacy Consult for Electrolyte Monitoring and Replacement   Recent Labs: Potassium (mmol/L)  Date Value  02/21/2023 3.7   Magnesium (mg/dL)  Date Value  82/95/6213 1.9   Calcium (mg/dL)  Date Value  08/65/7846 8.0 (L)   Albumin (g/dL)  Date Value  96/29/5284 3.5   Phosphorus (mg/dL)  Date Value  13/24/4010 3.0   Sodium (mmol/L)  Date Value  02/21/2023 132 (L)    Assessment: 89 y.o. female with PMH stage IV lung adenocarcinoma, h/o breast cancer, Afib on Eliquis, HFrEF who presents with SOB and hypoxia requiring CPAP. CXR c/f possible pneumonia.  Diuretics: furosemide 40 mg IV BID  Goal of Therapy:  Electrolytes WNL  Plan:  20 mEq po KCl x 1 Follow-up electrolytes with AM labs.  Lowella Bandy, PharmD Clinical Pharmacist 02/21/2023 7:38 AM

## 2023-02-21 NOTE — Progress Notes (Signed)
  Echocardiogram 2D Echocardiogram has been performed.  Daisy Parks 02/21/2023, 12:00 PM

## 2023-02-21 NOTE — Consult Note (Addendum)
Pharmacy Antibiotic Note  Daisy Parks is a 89 y.o. female admitted on 02/13/2023 with pneumonia.  Pharmacy has been consulted for cefepime dosing.  Assessment: 89 y.o. female with PMH advanced lung cancer, Afib on Eliquis, HTN, HLD who presents with progressive DOE and SOB, requiring BiPAP. Thoracentesis performed with 450cc fluid removed; cultures negative so far. Started on ceftriaxone and azithromycin, which is now changed to cefepime monotherapy given patient's worsening status. Currently on 100% FiO2 with O2 saturation in the mid-80s, with persistent leukocytosis.  Plan: Initiate cefepime 2 g IV q12H Follow up culture results to assess for antibiotic optimization Monitor renal function to assess for any necessary antibiotic dosing changes  Height: 5\' 5"  (165.1 cm) Weight: 60.8 kg (134 lb 0.6 oz) IBW/kg (Calculated) : 57  Temp (24hrs), Avg:98.4 F (36.9 C), Min:98.2 F (36.8 C), Max:98.6 F (37 C)  Recent Labs  Lab 01/29/2023 1959 02/14/2023 2233 02/19/23 0555 02/20/23 0258 02/21/23 0305  WBC 18.1*  --  21.1* 21.9* 21.5*  CREATININE 0.81  --  0.83 0.91 0.84  LATICACIDVEN 2.6* 1.5  --   --   --     Estimated Creatinine Clearance: 41.7 mL/min (by C-G formula based on SCr of 0.84 mg/dL).    Allergies  Allergen Reactions   Diltiazem     Other reaction(s): Dizziness dizzy dizzy    Hydrochlorothiazide     Other reaction(s): Dizziness   Chlorthalidone Other (See Comments)    Other reaction(s): Other (See Comments) Exacerbates afib Exacerbates afib    Pravastatin     Other reaction(s): Other (See Comments), Other (See Comments) Other reaction(s): MUSCLE PAIN Muscle pain  Other reaction(s): MUSCLE PAIN Muscle pain     Venlafaxine     Other reaction(s): Other (See Comments) Light headed Light headed    Doxazosin Palpitations    and Hypotension and Hypotension    Enalapril Itching    Other reaction(s): Other (See Comments), Other (See Comments) Cough  Cough      Losartan Palpitations   Mometasone Rash    Antimicrobials this admission: Vancomycin 5/29 x 1 Ceftriaxone 5/29 >> 5/31 Azithromycin 5/29 >> 5/31 Cefepime 6/1 >>  Dose adjustments this admission: N/A  Microbiology results: 5/29 BCx: 1/2, anaerobic bottle, Staph saprophyticus 5/30 MRSA PCR: Negative 5/31 Bcx: NG<12h 5/31 Pleural fluid: NG<24h  Thank you for allowing pharmacy to be a part of this patient's care.  Will M. Dareen Piano, PharmD PGY-1 Pharmacy Resident 02/21/2023 1:23 PM

## 2023-02-21 NOTE — Assessment & Plan Note (Signed)
Patient is on Toprol and IV Lasix

## 2023-02-21 NOTE — Progress Notes (Signed)
CRITICAL CARE PROGRESS NOTE    Name: Daisy Parks MRN: [id number] DOB: 1931/11/04     LOS: 3   SUBJECTIVE FINDINGS & SIGNIFICANT EVENTS    Patient description:  Very pleasant 89 yo F with hx of advanced lung cancer follow med/onc with [facility], atrial fibrillation on Eliquis, HTN, HLD comes to the hospital with complaints of progressive dyspnea on exertion and shortness of breath.  She has had treatment for lung cancer and was discussing radiation therapy with oncology.  She was noted to have bilateral pleural effusions worse on right with interstitial edema and hypotension. She is on eliquis.  We had Throacentesis performed via IR service and 450cc fluid has been removed. She has gram + cocci growing on blood cultures. Daugther is at bedside and we discussed goals of care.  She has met with palliative care and appears to have transitioned to DNR.   02/21/23- patient continues to show signs of dyspnea and distress.  Continues to have conversations with palliative.  S/p Midline insertion today. Patient on amiodarone.   Lines/tubes : External Urinary Catheter (Active)  Collection Container Dedicated Suction Canister 02/20/23 0800  Suction (Verified suction is between 40-80 mmHg) Yes 02/20/23 0800  Securement Method Securing device (Describe) 02/20/23 0800  Site Assessment Clean, Dry, Intact 02/20/23 0800  Intervention Female External Urinary Catheter Replaced 02/20/23 0335  Output (mL) 300 mL 02/20/23 0300    Microbiology/Sepsis markers: Results for orders placed or performed during the hospital encounter of 01/25/2023  SARS Coronavirus 2 by RT PCR (hospital order, performed in [facility] hospital lab) *cepheid single result test* Anterior Nasal Swab     Status: None   Collection Time: 01/31/2023  7:59 PM    Specimen: Anterior Nasal Swab  Result Value Ref Range Status   SARS Coronavirus 2 by RT PCR NEGATIVE NEGATIVE Final    Comment: (NOTE) SARS-CoV-2 target nucleic acids are NOT DETECTED.  The SARS-CoV-2 RNA is generally detectable in upper and lower respiratory specimens during the acute phase of infection. The lowest concentration of SARS-CoV-2 viral copies this assay can detect is 250 copies / mL. A negative result does not preclude SARS-CoV-2 infection and should not be used as the sole basis for treatment or other patient management decisions.  A negative result may occur with improper specimen collection / handling, submission of specimen other than nasopharyngeal swab, presence of viral mutation(s) within the areas targeted by this assay, and inadequate number of viral copies (<250 copies / mL). A negative result must be combined with clinical observations, patient history, and epidemiological information.  Fact Sheet for Patients:   RoadLapTop.co.za  Fact Sheet for Healthcare Providers: http://kim-miller.com/  This test is not yet approved or  cleared by the [place] FDA and has been authorized for detection and/or diagnosis of SARS-CoV-2 by FDA under an Emergency Use Authorization (EUA).  This EUA will remain in effect (meaning this test can be used) for the duration of the COVID-19 declaration under Section 564(b)(1) of the Act, 21 U.S.C. section 360bbb-3(b)(1), unless the authorization is terminated or revoked sooner.  Performed at [facility], [address] [place], [place] [address]   Culture, blood (Routine X 2) w Reflex to ID Panel     Status: None (Preliminary result)   Collection Time: 02/17/2023  7:59 PM   Specimen: BLOOD RIGHT ARM  Result Value Ref Range Status   Specimen Description BLOOD RIGHT ARM  Final   Special Requests   Final  BOTTLES DRAWN AEROBIC AND ANAEROBIC Blood Culture adequate volume    Culture  Setup Time   Final    GRAM POSITIVE COCCI ANAEROBIC BOTTLE ONLY Organism ID to follow CRITICAL RESULT CALLED TO, READ BACK BY AND VERIFIED WITH: Elenora Gamma, PHARMD AT 1128 ON 02/19/23 BY GM Performed at [facility], [address] [place], [place] [address]    Culture GRAM POSITIVE COCCI  Final   Report Status PENDING  Incomplete  Blood Culture ID Panel (Reflexed)     Status: Abnormal   Collection Time: 02/02/2023  7:59 PM  Result Value Ref Range Status   Enterococcus faecalis NOT DETECTED NOT DETECTED Final   Enterococcus Faecium NOT DETECTED NOT DETECTED Final   Listeria monocytogenes NOT DETECTED NOT DETECTED Final   Staphylococcus species DETECTED (A) NOT DETECTED Final    Comment: CRITICAL RESULT CALLED TO, READ BACK BY AND VERIFIED WITH: NATHAN BLUE, PHARMD AT 1128 ON 02/19/23 BY GM    Staphylococcus aureus (BCID) NOT DETECTED NOT DETECTED Final   Staphylococcus epidermidis NOT DETECTED NOT DETECTED Final   Staphylococcus lugdunensis NOT DETECTED NOT DETECTED Final   Streptococcus species NOT DETECTED NOT DETECTED Final   Streptococcus agalactiae NOT DETECTED NOT DETECTED Final   Streptococcus pneumoniae NOT DETECTED NOT DETECTED Final   Streptococcus pyogenes NOT DETECTED NOT DETECTED Final   A.calcoaceticus-baumannii NOT DETECTED NOT DETECTED Final   Bacteroides fragilis NOT DETECTED NOT DETECTED Final   Enterobacterales NOT DETECTED NOT DETECTED Final   Enterobacter cloacae complex NOT DETECTED NOT DETECTED Final   Escherichia coli NOT DETECTED NOT DETECTED Final   Klebsiella aerogenes NOT DETECTED NOT DETECTED Final   Klebsiella oxytoca NOT DETECTED NOT DETECTED Final   Klebsiella pneumoniae NOT DETECTED NOT DETECTED Final   Proteus species NOT DETECTED NOT DETECTED Final   Salmonella species NOT DETECTED NOT DETECTED Final   Serratia marcescens NOT DETECTED NOT DETECTED Final   Haemophilus influenzae NOT DETECTED NOT DETECTED Final   Neisseria  meningitidis NOT DETECTED NOT DETECTED Final   Pseudomonas aeruginosa NOT DETECTED NOT DETECTED Final   Stenotrophomonas maltophilia NOT DETECTED NOT DETECTED Final   Candida albicans NOT DETECTED NOT DETECTED Final   Candida auris NOT DETECTED NOT DETECTED Final   Candida glabrata NOT DETECTED NOT DETECTED Final   Candida krusei NOT DETECTED NOT DETECTED Final   Candida parapsilosis NOT DETECTED NOT DETECTED Final   Candida tropicalis NOT DETECTED NOT DETECTED Final   Cryptococcus neoformans/gattii NOT DETECTED NOT DETECTED Final    Comment: Performed at [facility], [address] [place], [place] [address]  Culture, blood (Routine X 2) w Reflex to ID Panel     Status: None (Preliminary result)   Collection Time: 01/26/2023 10:29 PM   Specimen: BLOOD  Result Value Ref Range Status   Specimen Description BLOOD HAND  Final   Special Requests   Final    BOTTLES DRAWN AEROBIC AND ANAEROBIC Blood Culture adequate volume   Culture   Final    NO GROWTH 3 DAYS Performed at [facility], [address] [place], [place] [address]    Report Status PENDING  Incomplete  MRSA Next Gen by PCR, Nasal     Status: None   Collection Time: 02/19/23 11:15 AM   Specimen: Nasal Mucosa; Nasal Swab  Result Value Ref Range Status   MRSA by PCR Next Gen NOT DETECTED NOT DETECTED Final    Comment: (NOTE) The GeneXpert MRSA Assay (FDA approved for NASAL specimens only), is  one component of a comprehensive MRSA colonization surveillance program. It is not intended to diagnose MRSA infection nor to guide or monitor treatment for MRSA infections. Test performance is not FDA approved in patients less than 81 years old. Performed at [facility], [address] [place], [place] [address]   Respiratory (~20 pathogens) panel by PCR     Status: None   Collection Time: 02/19/23 11:26 AM   Specimen: Anterior Nasal Swab; Respiratory  Result Value Ref Range Status   Adenovirus NOT  DETECTED NOT DETECTED Final   Coronavirus 229E NOT DETECTED NOT DETECTED Final    Comment: (NOTE) The Coronavirus on the Respiratory Panel, DOES NOT test for the novel  Coronavirus (2019 nCoV)    Coronavirus HKU1 NOT DETECTED NOT DETECTED Final   Coronavirus NL63 NOT DETECTED NOT DETECTED Final   Coronavirus OC43 NOT DETECTED NOT DETECTED Final   Metapneumovirus NOT DETECTED NOT DETECTED Final   Rhinovirus / Enterovirus NOT DETECTED NOT DETECTED Final   Influenza A NOT DETECTED NOT DETECTED Final   Influenza B NOT DETECTED NOT DETECTED Final   Parainfluenza Virus 1 NOT DETECTED NOT DETECTED Final   Parainfluenza Virus 2 NOT DETECTED NOT DETECTED Final   Parainfluenza Virus 3 NOT DETECTED NOT DETECTED Final   Parainfluenza Virus 4 NOT DETECTED NOT DETECTED Final   Respiratory Syncytial Virus NOT DETECTED NOT DETECTED Final   Bordetella pertussis NOT DETECTED NOT DETECTED Final   Bordetella Parapertussis NOT DETECTED NOT DETECTED Final   Chlamydophila pneumoniae NOT DETECTED NOT DETECTED Final   Mycoplasma pneumoniae NOT DETECTED NOT DETECTED Final    Comment: Performed at [facility], [address] [place], [place] [address]  Body fluid culture w Gram Stain     Status: None (Preliminary result)   Collection Time: 02/20/23  4:06 PM   Specimen: PATH Cytology Pleural fluid  Result Value Ref Range Status   Specimen Description   Final    PLEURAL Performed at [facility], [address], [place], [place] [address]    Special Requests   Final    NONE Performed at [facility], [address] [place], [place] [address]    Gram Stain   Final    WBC PRESENT, PREDOMINANTLY MONONUCLEAR NO ORGANISMS SEEN CYTOSPIN SMEAR Performed at [facility], [address] [place], [place] [address]    Culture PENDING  Incomplete   Report Status PENDING  Incomplete  Culture, blood (Routine X 2) w Reflex to ID Panel     Status: None (Preliminary result)    Collection Time: 02/20/23  7:23 PM   Specimen: BLOOD  Result Value Ref Range Status   Specimen Description BLOOD BLOOD RIGHT HAND  Final   Special Requests   Final    BOTTLES DRAWN AEROBIC AND ANAEROBIC Blood Culture results may not be optimal due to an inadequate volume of blood received in culture bottles   Culture   Final    NO GROWTH < 12 HOURS Performed at [facility], [address] [place], [place] [address]    Report Status PENDING  Incomplete  Culture, blood (Routine X 2) w Reflex to ID Panel     Status: None (Preliminary result)   Collection Time: 02/20/23  7:23 PM   Specimen: BLOOD  Result Value Ref Range Status   Specimen Description BLOOD BLOOD RIGHT ARM  Final   Special Requests   Final    BOTTLES DRAWN AEROBIC AND ANAEROBIC Blood Culture  results may not be optimal due to an inadequate volume of blood received in culture bottles   Culture   Final    NO GROWTH < 12 HOURS Performed at [facility], [address], [place], [place] [address]    Report Status PENDING  Incomplete    Anti-infectives:  Anti-infectives (From admission, onward)    Start     Dose/Rate Route Frequency Ordered Stop   02/08/2023 2215  cefTRIAXone (ROCEPHIN) 1 g in sodium chloride 0.9 % 100 mL IVPB        1 g 200 mL/hr over 30 Minutes Intravenous Every 24 hours 01/24/2023 2203     02/12/2023 2215  azithromycin (ZITHROMAX) 500 mg in sodium chloride 0.9 % 250 mL IVPB        500 mg 250 mL/hr over 60 Minutes Intravenous Every 24 hours 01/23/2023 2203 02/20/23 2351   02/16/2023 2130  vancomycin (VANCOCIN) IVPB 1000 mg/200 mL premix  Status:  Discontinued        1,000 mg 200 mL/hr over 60 Minutes Intravenous  Once 02/15/2023 2118 02/14/2023 2120   02/12/2023 2130  ceFEPIme (MAXIPIME) 2 g in sodium chloride 0.9 % 100 mL IVPB  Status:  Discontinued        2 g 200 mL/hr over 30 Minutes Intravenous  Once 02/10/2023 2118 01/22/2023 2206   01/21/2023 2130  vancomycin (VANCOREADY) IVPB 1500 mg/300 mL         1,500 mg 150 mL/hr over 120 Minutes Intravenous  Once 02/10/2023 2120 02/19/23 0156         PAST MEDICAL HISTORY   Past Medical History:  Diagnosis Date   Atrial fibrillation (HCC)    Breast cancer (HCC) 1192   LUMPECTOMY   Cancer (HCC)    Coronary artery disease    Hyperlipidemia    Hypertension    Syncope and collapse      SURGICAL HISTORY   Past Surgical History:  Procedure Laterality Date   BREAST LUMPECTOMY  1992   LEFT BREAST   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   FASCUCUTANEOUS GRAFT     FAMILY HISTORY   Family History  Problem Relation Age of Onset   CVA Mother    CAD Father    CVA Father    Heart attack Sister    Hypertension Brother      SOCIAL HISTORY   Social History   Tobacco Use   Smoking status: Never   Smokeless tobacco: Never  Substance Use Topics   Alcohol use: No    Alcohol/week: 0.0 standard drinks of alcohol   Drug use: No     MEDICATIONS   Current Medication:  Current Facility-Administered Medications:    acetaminophen (TYLENOL) tablet 650 mg, 650 mg, Oral, Q6H PRN, 650 mg at 02/19/23 1510 **OR** acetaminophen (TYLENOL) suppository 650 mg, 650 mg, Rectal, Q6H PRN, Nolberto Hanlon, MD   alum & mag hydroxide-simeth (MAALOX/MYLANTA) 200-200-20 MG/5ML suspension 30 mL, 30 mL, Oral, Q4H PRN, Amin, Ankit Chirag, MD   amiodarone (NEXTERONE PREMIX) 360-4.14 MG/200ML-% (1.8 mg/mL) IV infusion, 30 mg/hr, Intravenous, Continuous, Harlon Ditty D, NP, Last Rate: 16.67 mL/hr at 02/21/23 0600, 30 mg/hr at 02/21/23 0600   apixaban (ELIQUIS) tablet 5 mg, 5 mg, Oral, BID, Amin, Ankit Chirag, MD, 5 mg at 02/20/23 1022   cefTRIAXone (ROCEPHIN) 1 g in sodium chloride 0.9 % 100 mL IVPB, 1 g, Intravenous, Q24H, Nolberto Hanlon, MD, Last Rate: 200 mL/hr at 02/20/23 2143, 1 g at 02/20/23  2143   Chlorhexidine Gluconate Cloth 2 % PADS 6 each, 6 each, Topical, Daily, Nolberto Hanlon, MD, 6 each at 02/19/23 1123   furosemide (LASIX)  injection 40 mg, 40 mg, Intravenous, BID, Amin, Ankit Chirag, MD, 40 mg at 02/20/23 1818   guaiFENesin (ROBITUSSIN) 100 MG/5ML liquid 5 mL, 5 mL, Oral, Q4H PRN, Amin, Ankit Chirag, MD, 5 mL at 02/21/23 0829   hyaluronidase Human (HYLENEX) injection 150 Units, 150 Units, Subcutaneous, Once, Orson Aloe, RPH   hydrALAZINE (APRESOLINE) injection 10 mg, 10 mg, Intravenous, Q4H PRN, Amin, Ankit Chirag, MD   hydrOXYzine (ATARAX) tablet 10 mg, 10 mg, Oral, TID PRN, Amin, Ankit Chirag, MD   insulin aspart (novoLOG) injection 0-5 Units, 0-5 Units, Subcutaneous, QHS, Nolberto Hanlon, MD   insulin aspart (novoLOG) injection 0-9 Units, 0-9 Units, Subcutaneous, TID WC, Nolberto Hanlon, MD, 1 Units at 02/21/23 0815   ipratropium (ATROVENT) nebulizer solution 0.5 mg, 0.5 mg, Nebulization, TID, Amin, Tilman Neat, MD   ipratropium-albuterol (DUONEB) 0.5-2.5 (3) MG/3ML nebulizer solution 3 mL, 3 mL, Nebulization, Q4H PRN, Amin, Ankit Chirag, MD   levalbuterol (XOPENEX) nebulizer solution 0.63 mg, 0.63 mg, Nebulization, TID, Harlon Ditty D, NP, 0.63 mg at 02/21/23 0758   lip balm (BLISTEX) ointment 1 Application, 1 Application, Topical, PRN, Amin, Ankit Chirag, MD   loratadine (CLARITIN) tablet 10 mg, 10 mg, Oral, Daily PRN, Amin, Ankit Chirag, MD   metoprolol succinate (TOPROL-XL) 24 hr tablet 12.5 mg, 12.5 mg, Oral, BID, Amin, Ankit Chirag, MD, 12.5 mg at 02/20/23 1022   morphine (PF) 2 MG/ML injection 1 mg, 1 mg, Intravenous, Q4H PRN, Harlon Ditty D, NP   Muscle Rub CREA 1 Application, 1 Application, Topical, PRN, Amin, Ankit Chirag, MD   ondansetron (ZOFRAN) injection 4 mg, 4 mg, Intravenous, Q6H PRN, Harlon Ditty D, NP, 4 mg at 02/20/23 1947   Oral care mouth rinse, 15 mL, Mouth Rinse, 4 times per day, Arnetha Courser, MD, 15 mL at 02/21/23 0830   Oral care mouth rinse, 15 mL, Mouth Rinse, PRN, Arnetha Courser, MD   pantoprazole (PROTONIX) injection 40 mg, 40 mg, Intravenous, Q24H, Nolberto Hanlon, MD, 40 mg at  02/21/23 0001   phenol (CHLORASEPTIC) mouth spray 1 spray, 1 spray, Mouth/Throat, PRN, Amin, Ankit Chirag, MD   polyvinyl alcohol (LIQUIFILM TEARS) 1.4 % ophthalmic solution 1 drop, 1 drop, Both Eyes, PRN, Amin, Ankit Chirag, MD   prochlorperazine (COMPAZINE) tablet 5 mg, 5 mg, Oral, Q8H PRN, Amin, Ankit Chirag, MD   senna-docusate (Senokot-S) tablet 1 tablet, 1 tablet, Oral, QHS PRN, Amin, Ankit Chirag, MD, 1 tablet at 02/20/23 1029   sertraline (ZOLOFT) tablet 50 mg, 50 mg, Oral, Daily, Amin, Ankit Chirag, MD, 50 mg at 02/20/23 1022   sodium chloride (OCEAN) 0.65 % nasal spray 1 spray, 1 spray, Each Nare, PRN, Amin, Ankit Chirag, MD   sodium chloride flush (NS) 0.9 % injection 3 mL, 3 mL, Intravenous, Q12H, Nolberto Hanlon, MD, 3 mL at 02/20/23 2140   spironolactone (ALDACTONE) tablet 12.5 mg, 12.5 mg, Oral, Daily, Amin, Ankit Chirag, MD, 12.5 mg at 02/20/23 1023   traZODone (DESYREL) tablet 50 mg, 50 mg, Oral, QHS PRN, Amin, Ankit Chirag, MD, 50 mg at 02/19/23 2247    ALLERGIES   Diltiazem, Hydrochlorothiazide, Chlorthalidone, Pravastatin, Venlafaxine, Doxazosin, Enalapril, Losartan, and Mometasone    REVIEW OF SYSTEMS     10 point ROS unable to obtain due to encephalopathy  PHYSICAL EXAMINATION   Vital Signs: Temp:  [98.2 F (36.8 C)-98.6 F (37  C)] 98.4 F (36.9 C) (06/01 0802) Pulse Rate:  [75-124] 123 (06/01 0802) Resp:  [21-38] 31 (06/01 0802) BP: (91-136)/(56-83) 112/83 (06/01 0802) SpO2:  [75 %-100 %] 88 % (06/01 0802) FiO2 (%):  [95 %-100 %] 100 % (06/01 0802)  GENERAL:age appropriate mild distress HEAD: Normocephalic, atraumatic.  EYES: Pupils equal, round, reactive to light.  No scleral icterus.  MOUTH: Moist mucosal membrane. NECK: Supple. No thyromegaly. No nodules. No JVD.  PULMONARY: rhonchi bilaterally  CARDIOVASCULAR: S1 and S2. Regular rate and rhythm. No murmurs, rubs, or gallops.  GASTROINTESTINAL: Soft, nontender, non-distended. No masses. Positive  bowel sounds. No hepatosplenomegaly.  MUSCULOSKELETAL: No swelling, clubbing, or edema.  NEUROLOGIC: Mild distress due to acute illness with encephalopathy SKIN:intact,warm,dry   PERTINENT DATA     Infusions:  amiodarone 30 mg/hr (02/21/23 0600)   cefTRIAXone (ROCEPHIN)  IV 1 g (02/20/23 2143)   Scheduled Medications:  apixaban  5 mg Oral BID   Chlorhexidine Gluconate Cloth  6 each Topical Daily   furosemide  40 mg Intravenous BID   hyaluronidase Human  150 Units Subcutaneous Once   insulin aspart  0-5 Units Subcutaneous QHS   insulin aspart  0-9 Units Subcutaneous TID WC   ipratropium  0.5 mg Nebulization TID   levalbuterol  0.63 mg Nebulization TID   metoprolol succinate  12.5 mg Oral BID   mouth rinse  15 mL Mouth Rinse 4 times per day   pantoprazole (PROTONIX) IV  40 mg Intravenous Q24H   sertraline  50 mg Oral Daily   sodium chloride flush  3 mL Intravenous Q12H   spironolactone  12.5 mg Oral Daily   PRN Medications: acetaminophen **OR** acetaminophen, alum & mag hydroxide-simeth, guaiFENesin, hydrALAZINE, hydrOXYzine, ipratropium-albuterol, lip balm, loratadine, morphine injection, Muscle Rub, ondansetron (ZOFRAN) IV, mouth rinse, phenol, polyvinyl alcohol, prochlorperazine, senna-docusate, sodium chloride, traZODone Hemodynamic parameters:   Intake/Output: 05/31 0701 - 06/01 0700 In: 439.1 [I.V.:339.1; IV Piggyback:100] Out: 800 [Urine:800]  Ventilator  Settings: FiO2 (%):  [95 %-100 %] 100 %     LAB RESULTS:  Basic Metabolic Panel: Recent Labs  Lab 02/07/2023 1959 02/19/23 0555 02/19/23 1007 02/20/23 0258 02/20/23 2055 02/21/23 0305  NA 133* 136  --  133*  --  132*  K 2.9* 3.2*  --  4.2 3.7 3.7  CL 100 104  --  104  --  99  CO2 23 22  --  22  --  25  GLUCOSE 172* 127*  --  138*  --  146*  BUN 13 15  --  22  --  22  CREATININE 0.81 0.83  --  0.91  --  0.84  CALCIUM 8.5* 8.1*  --  8.0*  --  8.0*  MG 2.0  --   --  2.0 1.9 1.9  PHOS  --   --  3.7  --    --  3.0    Liver Function Tests: Recent Labs  Lab 02/20/2023 1959  AST 31  ALT 15  ALKPHOS 71  BILITOT 1.6*  PROT 6.8  ALBUMIN 3.5    No results for input(s): "LIPASE", "AMYLASE" in the last 168 hours. No results for input(s): "AMMONIA" in the last 168 hours. CBC: Recent Labs  Lab 02/02/2023 1959 02/19/23 0555 02/20/23 0258 02/21/23 0305  WBC 18.1* 21.1* 21.9* 21.5*  NEUTROABS 16.3*  --   --   --   HGB 11.5* 10.4* 9.9* 10.2*  HCT 34.9* 31.9* 30.3* 31.4*  MCV 92.3 93.3 91.8 92.9  PLT 267 256 224 246    Cardiac Enzymes: No results for input(s): "CKTOTAL", "CKMB", "CKMBINDEX", "TROPONINI" in the last 168 hours. BNP: Invalid input(s): "POCBNP" CBG: Recent Labs  Lab 02/20/23 0732 02/20/23 1124 02/20/23 1606 02/20/23 2147 02/21/23 0735  GLUCAP 109* 120* 111* 143* 123*        IMAGING RESULTS:  Imaging: DG Chest Port 1 View  Result Date: 02/21/2023 CLINICAL DATA:  [id number] with acute respiratory failure with hypoxia. EXAM: PORTABLE CHEST 1 VIEW COMPARISON:  Portable chest yesterday at 4:09 p.m. FINDINGS: 5:04 a.m. Right chest dual lead pacing system and wire insertions are unaltered as well as multiple overlying monitor wires. Extensive diffuse dense bilateral airspace disease with underlying interstitial prominence shows no interval improvement or worsening. There are small pleural effusions also unchanged. The heart is enlarged. The central vasculature is obscured by the airspace disease. There is calcification of the transverse aorta with stable mediastinum. Old left axillary surgical clips. Mild osteopenia and degenerative change.  No acute osseous findings. IMPRESSION: No interval improvement or worsening of diffuse dense bilateral airspace disease with underlying interstitial prominence. Stable small pleural effusions. Stable cardiomegaly. Electronically Signed   By: Almira Bar M.D.   On: 02/21/2023 07:02   DG Chest Port 1 View  Result Date: 02/20/2023 CLINICAL  DATA:  [id number] Pleural effusion [id number] status post right thoracentesis EXAM: PORTABLE CHEST 1 VIEW COMPARISON:  Multiple priors FINDINGS: Cardiomediastinal silhouette is obscured by diffuse alveolar airspace disease. Findings are not significantly changed. No significant pleural effusion. No pneumothorax. No acute osseous abnormality. There is a 2 lead implantable cardiac device. Surgical clips overlie the left chest. IMPRESSION: No findings of pneumothorax following right thoracentesis. Similar pattern of diffuse alveolar airspace disease. Electronically Signed   By: Olive Bass M.D.   On: 02/20/2023 16:38   US THORACENTESIS ASP PLEURAL SPACE W/IMG GUIDE  Result Date: 02/20/2023 INDICATION: 89 year old with history of lung cancer with mets admitted for pneumonia found to have bilateral moderate pleural effusions. Request received for diagnostic and therapeutic right thoracentesis. EXAM: ULTRASOUND GUIDED DIAGNOSTIC AND THERAPEUTIC RIGHT THORACENTESIS MEDICATIONS: 10 mL 1 % lidocaine COMPLICATIONS: None immediate. PROCEDURE: An ultrasound guided thoracentesis was thoroughly discussed with the patient and questions answered. The benefits, risks, alternatives and complications were also discussed. The patient understands and wishes to proceed with the procedure. Written consent was obtained. Ultrasound was performed to localize and mark an adequate pocket of fluid in the right chest. The area was then prepped and draped in the normal sterile fashion. 1% Lidocaine was used for local anesthesia. Under ultrasound guidance a 6 Fr Safe-T-Centesis catheter was introduced. Thoracentesis was performed. The catheter was removed and a dressing applied. FINDINGS: A total of approximately 450 cc of hazy, amber fluid was removed. Samples were sent to the laboratory as requested by the clinical team. IMPRESSION: Successful ultrasound guided right thoracentesis yielding 450 cc of pleural fluid. Procedure was performed by Alex Gardener, NP and supervised by Olive Bass, MD Electronically Signed   By: Olive Bass M.D.   On: 02/20/2023 16:15   DG Chest Port 1 View  Result Date: 02/20/2023 CLINICAL DATA:  Dyspnea EXAM: PORTABLE CHEST 1 VIEW COMPARISON:  Feb 19, 2023. FINDINGS: Progressive extensive interstitial and airspace opacities throughout both lungs. Enlarged cardiac silhouette, partially obscured. Right subclavian cardiac rhythm maintenance device. No visible pneumothorax or large pleural effusions. Polyarticular degenerative change. IMPRESSION: Progressive extensive interstitial and airspace opacities throughout both lungs, most likely edema. Superimposed infection is not excluded. Electronically  Signed   By: Feliberto Harts M.D.   On: 02/20/2023 11:40   CT CHEST WO CONTRAST  Result Date: 02/19/2023 CLINICAL DATA:  Abnormal chest radiograph EXAM: CT CHEST WITHOUT CONTRAST TECHNIQUE: Multidetector CT imaging of the chest was performed following the standard protocol without IV contrast. RADIATION DOSE REDUCTION: This exam was performed according to the departmental dose-optimization program which includes automated exposure control, adjustment of the mA and/or kV according to patient size and/or use of iterative reconstruction technique. COMPARISON:  Chest x-ray dated Feb 18, 2023 FINDINGS: Cardiovascular: Cardiomegaly. No pericardial effusion. Normal caliber thoracic aorta with severe calcified plaque. Severe three-vessel coronary artery calcifications. Right chest wall dual lead pacer with leads in the right ventricle and right atrium. Mediastinum/Nodes: Esophagus and thyroid are unremarkable. No enlarged lymph nodes seen in the chest. Lungs/Pleura: Central airways are patent. Evaluation of the lungs is somewhat limited due to respiratory motion artifact. Central predominant ground-glass opacities with scattered more confluent areas of consolidation and associated septal thickening. Moderate right and small left pleural  effusions and bibasilar atelectasis. Upper Abdomen: Scattered low-attenuation liver lesions which are likely simple cysts. No acute abnormality. Musculoskeletal: No chest wall mass or suspicious bone lesions identified. IMPRESSION: 1. Central predominant ground-glass opacities with scattered more confluent areas of consolidation and associated septal thickening, findings are likely due to pulmonary edema. 2. Moderate right and small left pleural effusions. 3. Cardiomegaly. 4. Severe coronary artery and aortic atherosclerosis (ICD10-I70.0). Electronically Signed   By: Allegra Lai M.D.   On: 02/19/2023 11:25   @PROBHOSP @ DG Chest Port 1 View  Result Date: 02/21/2023 CLINICAL DATA:  [id number] with acute respiratory failure with hypoxia. EXAM: PORTABLE CHEST 1 VIEW COMPARISON:  Portable chest yesterday at 4:09 p.m. FINDINGS: 5:04 a.m. Right chest dual lead pacing system and wire insertions are unaltered as well as multiple overlying monitor wires. Extensive diffuse dense bilateral airspace disease with underlying interstitial prominence shows no interval improvement or worsening. There are small pleural effusions also unchanged. The heart is enlarged. The central vasculature is obscured by the airspace disease. There is calcification of the transverse aorta with stable mediastinum. Old left axillary surgical clips. Mild osteopenia and degenerative change.  No acute osseous findings. IMPRESSION: No interval improvement or worsening of diffuse dense bilateral airspace disease with underlying interstitial prominence. Stable small pleural effusions. Stable cardiomegaly. Electronically Signed   By: Almira Bar M.D.   On: 02/21/2023 07:02   DG Chest Port 1 View  Result Date: 02/20/2023 CLINICAL DATA:  [id number] Pleural effusion [id number] status post right thoracentesis EXAM: PORTABLE CHEST 1 VIEW COMPARISON:  Multiple priors FINDINGS: Cardiomediastinal silhouette is obscured by diffuse alveolar airspace disease.  Findings are not significantly changed. No significant pleural effusion. No pneumothorax. No acute osseous abnormality. There is a 2 lead implantable cardiac device. Surgical clips overlie the left chest. IMPRESSION: No findings of pneumothorax following right thoracentesis. Similar pattern of diffuse alveolar airspace disease. Electronically Signed   By: Olive Bass M.D.   On: 02/20/2023 16:38   US THORACENTESIS ASP PLEURAL SPACE W/IMG GUIDE  Result Date: 02/20/2023 INDICATION: 89 year old with history of lung cancer with mets admitted for pneumonia found to have bilateral moderate pleural effusions. Request received for diagnostic and therapeutic right thoracentesis. EXAM: ULTRASOUND GUIDED DIAGNOSTIC AND THERAPEUTIC RIGHT THORACENTESIS MEDICATIONS: 10 mL 1 % lidocaine COMPLICATIONS: None immediate. PROCEDURE: An ultrasound guided thoracentesis was thoroughly discussed with the patient and questions answered. The benefits, risks, alternatives and complications were also discussed. The patient understands and  wishes to proceed with the procedure. Written consent was obtained. Ultrasound was performed to localize and mark an adequate pocket of fluid in the right chest. The area was then prepped and draped in the normal sterile fashion. 1% Lidocaine was used for local anesthesia. Under ultrasound guidance a 6 Fr Safe-T-Centesis catheter was introduced. Thoracentesis was performed. The catheter was removed and a dressing applied. FINDINGS: A total of approximately 450 cc of hazy, amber fluid was removed. Samples were sent to the laboratory as requested by the clinical team. IMPRESSION: Successful ultrasound guided right thoracentesis yielding 450 cc of pleural fluid. Procedure was performed by Alex Gardener, NP and supervised by Olive Bass, MD Electronically Signed   By: Olive Bass M.D.   On: 02/20/2023 16:15   DG Chest Port 1 View  Result Date: 02/20/2023 CLINICAL DATA:  Dyspnea EXAM: PORTABLE CHEST 1  VIEW COMPARISON:  Feb 19, 2023. FINDINGS: Progressive extensive interstitial and airspace opacities throughout both lungs. Enlarged cardiac silhouette, partially obscured. Right subclavian cardiac rhythm maintenance device. No visible pneumothorax or large pleural effusions. Polyarticular degenerative change. IMPRESSION: Progressive extensive interstitial and airspace opacities throughout both lungs, most likely edema. Superimposed infection is not excluded. Electronically Signed   By: Feliberto Harts M.D.   On: 02/20/2023 11:40        ASSESSMENT AND PLAN    -Multidisciplinary rounds held today  Acute Hypoxic Respiratory Failure -due to bilateral effusions with compressive atelectasis and interstitial edema - suspect cardiac etiology due to elevated cardiac biomarkers however cannot rule out sepsis due to + blood cultures with strep and also on differential is progressive cancer with malignant effusion. -Diagnostic and therapeutic thoracentesis -  -patient is DNR - palliative care is following -continue Bronchodilator Therapy   CARDIAC FAILURE -oxygen as needed -Lasix as tolerated -follow up cardiac enzymes as indicated ICU monitoring    ID -continue IV abx as prescibed -follow up cultures  GI/Nutrition GI PROPHYLAXIS as indicated DIET-->TF's as tolerated Constipation protocol as indicated  ENDO - ICU hypoglycemic\Hyperglycemia protocol -check FSBS per protocol   ELECTROLYTES -follow labs as needed -replace as needed -pharmacy consultation   DVT/GI PRX ordered -SCDs  TRANSFUSIONS AS NEEDED MONITOR FSBS ASSESS the need for LABS as needed  Critical care provider statement:   Total critical care time: 33 minutes   Performed by: Karna Christmas MD   Critical care time was exclusive of separately billable procedures and treating other patients.   Critical care was necessary to treat or prevent imminent or life-threatening deterioration.   Critical care was time  spent personally by me on the following activities: development of treatment plan with patient and/or surrogate as well as nursing, discussions with consultants, evaluation of patient's response to treatment, examination of patient, obtaining history from patient or surrogate, ordering and performing treatments and interventions, ordering and review of laboratory studies, ordering and review of radiographic studies, pulse oximetry and re-evaluation of patient's condition.       This document was prepared using Dragon voice recognition software and may include unintentional dictation errors.    Vida Rigger, M.D.  Division of Pulmonary & Critical Care Medicine  [facility]

## 2023-02-21 NOTE — Assessment & Plan Note (Signed)
Patient with elevated BNP and signs of volume overload. -Continue with 40 mg twice daily IV Lasix -Daily BMP and weight -Strict intake and output

## 2023-02-21 NOTE — Assessment & Plan Note (Signed)
Patient with history of stage II adenocarcinoma which is being managed by outpatient oncology. Also has an history of prior breast cancer

## 2023-02-21 NOTE — Progress Notes (Addendum)
Progress Note   Patient: Daisy Parks UEA:[id number] DOB: 12-06-1931 DOA: 02/06/2023     3 DOS: the patient was seen and examined on 02/21/2023   Brief hospital course: Taken from prior notes.  89 year old with history of lung cancer with mets follows at [facility] oncology, atrial fibrillation on Eliquis, HTN, HLD comes to the hospital with complaints of progressive dyspnea on exertion and shortness of breath.  Upon admission found to have pneumonia started on IV antibiotics and required BiPAP.  Patient was thought to be in volume overload requiring IV Lasix.  Slowly being transition to high flow nasal cannula.  Pulmonary consulted and patient underwent thoracentesis with removal of 450 cc fluid, preliminary cultures negative.  Patient continued to have worsening respiratory status.  Did required BiPAP and later transitioned to heated high flow at 100% FiO2 and 60 L of oxygen.  She was also have A-fib with RVR, started on amiodarone infusion.  Patient is DNR/DNI and if continued to get worse then most likely be transition to comfort care.  Palliative care was also consulted with meeting scheduled for 02/21/2023.  6/1: Patient remained on 100% FiO2 and 60 L of oxygen and barely saturating in low to mid 80s.  Multiple family members at bedside.  Remained in RVR with softer blood pressure. Worsening leukocytosis.  Palliative care meeting later today.        Assessment and Plan: * Acute respiratory failure with hypoxia (HCC) Bilateral pleural effusions.  S/p thoracentesis by pulmonary.  Preliminary pleural fluid cultures negative.  Cytology pending. Remained on 100% FiO2 and 60 L of oxygen, remained hypoxic. Patient is DNR and DNI.  Palliative care was consulted, if she continued to deteriorate then need to proceed with comfort care. -Continue with heated high flow -Continue with supportive care -Palliative care meeting later today  Community acquired pneumonia Worsening procalcitonin at  1.77. -Switch ceftriaxone with cefepime -Continue with supportive care  Acute heart failure with preserved ejection fraction (HFpEF) (HCC) Patient with elevated BNP and signs of volume overload. -Continue with 40 mg twice daily IV Lasix -Daily BMP and weight -Strict intake and output  Essential hypertension Patient is on Toprol and IV Lasix  Chronic atrial fibrillation with RVR (HCC) Patient remained in RVR.  Cardiology also started digoxin. -Continue with amiodarone and Toprol -Continue with Eliquis  Hypokalemia Resolved with repletion. -Continue to monitor as she is being diuresed  Primary lung adenocarcinoma [facility]) Patient with history of stage II adenocarcinoma which is being managed by outpatient oncology. Also has an history of prior breast cancer   Subjective: Patient was seen and examined today.  Continues to feel short of breath with mildly increased work of breathing.  Multiple family members at bedside.  She wants to go home.  Physical Exam: Vitals:   02/21/23 0802 02/21/23 1000 02/21/23 1100 02/21/23 1200  BP: 112/83 (!) 138/115 (!) 117/90 (!) 94/47  Pulse: (!) 123 (!) 123 (!) 117 (!) 129  Resp: (!) 31 (!) 23 (!) 24 (!) 22  Temp: 98.4 F (36.9 C)   98.2 F (36.8 C)  TempSrc: Axillary   Axillary  SpO2: (!) 88% (!) 89% (!) 88% 90%  Weight:      Height:       General.  Frail elderly lady, in no acute distress. Pulmonary.  Lungs clear bilaterally, mildly increased work of breathing. CV.  Regular rate and rhythm, no JVD, rub or murmur. Abdomen.  Soft, nontender, nondistended, BS positive. CNS.  Alert and oriented .  No focal neurologic deficit. Extremities.  No edema, no cyanosis, pulses intact and symmetrical. Psychiatry.  Judgment and insight appears normal.   Data Reviewed:   Family Communication: Multiple family members at bedside  Disposition: Status is: Inpatient Remains inpatient appropriate because: Severity of illness  Planned Discharge  Destination:  To be determined  DVT prophylaxis.  Eliquis Time spent: 50 minutes  This record has been created using Conservation officer, historic buildings. Errors have been sought and corrected,but may not always be located. Such creation errors do not reflect on the standard of care.   Author: Arnetha Courser, MD 02/21/2023 1:08 PM  For on call review [email or web address].

## 2023-02-21 NOTE — Progress Notes (Signed)
Patient having long, continuous runs of [facility]- Dr. Karna Christmas made aware.  EKG was read showing sinus tach with frequent PVCs.  Dr. Karna Christmas ordered for digoxin and calcium gluconate IV.  Labs were ordered as well- but due to patient having limited extremity- (Hx of breast ca on left and midline placed on right arm with amiodarone infusion), Dr. Karna Christmas stated to hold off on labs at this time. Patient still alert and oriented - stating that she feels "very tired." Dr. Karna Christmas aware.

## 2023-02-21 NOTE — Progress Notes (Signed)
A consult was placed to the IV Nurse for new IV access;  pt limited to right arm only for all ivs, and blood work;  pt has had an amiodarone infiltrate involving the entire right forearm, from her wrist area to her Right elbow; Dr Karna Christmas at the bedside during IV assessment and has ordered a midline to be placed; A PICC line was discussed and recommended , due to pH of amiodarone, and location of midline catheter tip,  however MD preferred that a midline be placed rather than the PICC;  see IV Flowsheet;  RUA midline has good blood return on insertion; educated RN to check the RUA frequently and assess for a blood return.  Pink restricted extremity band on the R wrist.

## 2023-02-21 DEATH — deceased

## 2023-02-22 DIAGNOSIS — J189 Pneumonia, unspecified organism: Secondary | ICD-10-CM | POA: Diagnosis not present

## 2023-02-22 DIAGNOSIS — J810 Acute pulmonary edema: Secondary | ICD-10-CM | POA: Diagnosis not present

## 2023-02-22 DIAGNOSIS — I10 Essential (primary) hypertension: Secondary | ICD-10-CM | POA: Diagnosis not present

## 2023-02-22 DIAGNOSIS — J9601 Acute respiratory failure with hypoxia: Secondary | ICD-10-CM | POA: Diagnosis not present

## 2023-02-22 DIAGNOSIS — I5031 Acute diastolic (congestive) heart failure: Secondary | ICD-10-CM | POA: Diagnosis not present

## 2023-02-22 LAB — CULTURE, BLOOD (ROUTINE X 2)
Culture: NO GROWTH
Special Requests: ADEQUATE

## 2023-02-22 LAB — BODY FLUID CULTURE W GRAM STAIN

## 2023-02-22 MED ORDER — MORPHINE SULFATE (PF) 2 MG/ML IV SOLN
1.0000 mg | INTRAVENOUS | Status: DC | PRN
  Administered 2023-02-22 (×3): 2 mg via INTRAVENOUS
  Filled 2023-02-22 (×3): qty 1

## 2023-02-23 LAB — CULTURE, BLOOD (ROUTINE X 2): Culture: NO GROWTH

## 2023-02-23 LAB — MISC LABCORP TEST (SEND OUT)

## 2023-02-24 LAB — THYROID PANEL WITH TSH
Free Thyroxine Index: 3.6 (ref 1.2–4.9)
T3 Uptake Ratio: 41 % — ABNORMAL HIGH (ref 24–39)
T4, Total: 8.8 ug/dL (ref 4.5–12.0)
TSH: 1.17 u[IU]/mL (ref 0.450–4.500)

## 2023-02-24 LAB — BODY FLUID CULTURE W GRAM STAIN: Culture: NO GROWTH

## 2023-02-25 LAB — CYTOLOGY - NON PAP

## 2023-02-25 LAB — CULTURE, BLOOD (ROUTINE X 2): Culture: NO GROWTH

## 2023-03-04 LAB — MISC LABCORP TEST (SEND OUT): Labcorp test code: 5367

## 2023-03-23 NOTE — Progress Notes (Signed)
Daily Progress Note   Patient Name: Daisy Parks       Date: 03/03/2023 DOB: 18-Sep-1932  Age: 89 y.o. MRN#: [id number] Attending Physician: No att. providers found Primary Care Physician: Kurtis Bushman, MD Admit Date: 02/19/2023  Reason for Consultation/Follow-up: Establishing goals of care  HPI/Brief Hospital Review: 89 y.o. female  with past medical history of lung cancer with metastasis followed by [facility] oncology, atrial fibrillation on Eliquis, HTN and HLD admitted from home on 01/25/2023 with progressive dyspnea on exertion and shortness of breath.     Found to have bilateral pleural effusions with interstitial edema and hypotension Underwent US guided thoracentesis 5/31 for which 450cc fluid was removed   5/31 overnight events: period of severe hypoxia secondary to her removal of HFNC and development of A. Fib with RVR Intermittently placed on BIPAP and started on Amiodarone infusion   Palliative medicine was consulted for assisting with goals of care conversations.   Subjective: Extensive chart review has been completed prior to meeting patient including labs, vital signs, imaging, progress notes, orders, and available advanced directive documents from current and previous encounters.     Visited with family at bedside this AM. Discussed noted ongoing decline, recommended full transition to comfort as discussed 6/1. Family present requested time for daughter-Pam and son-Tim to be called to bedside.   Multiple family and close friends visiting bedside throughout morning. Answered and addressed all questions and concerns. Ongoing emotional support provided. Once daughter-Pam and son-Tim arrived decision made to fully transition to comfort measures including ensuring comfort with  administration of as needed morphine with liberation of high levels of oxygen therapy and amiodarone infusion. Family in agreement with plan of care.   Family requested my presence at bedside during time of transition. Answered questions surrounding end of life and symptoms to be expected or anticipated. Ensured ongoing comfort. Ongoing emotional support provided.   Confirmed TOD with nursing staff 1400.   Thank you for allowing the Palliative Medicine Team to assist in the care of this patient.  Total time:  65 minutes  Time spent includes: Detailed review of medical records (labs, imaging, vital signs), medically appropriate exam (mental status, respiratory, cardiac, skin), discussed with treatment team, counseling and educating patient, family and staff, documenting clinical information, medication management and coordination of care.  Ladona Ridgel  Karene Fry, AGNP-C Palliative Medicine   Please contact Palliative Medicine Team phone at [phone] for questions and concerns.

## 2023-03-23 NOTE — Plan of Care (Signed)
Discussed with patient and family plan of care for the evening, pain management and respiratory status with some teach back displayed at this time.    The patient did have an episode when clean her up of holding her breath and may have aspirated on her saliva,  Patient seem to decline and daughter and son were called when she was not responding at the time.  Patient was suctioned and NRBM was placed on her.    The patient woke up after 20-30 minutes some what responsive.  The family and on-call MD was contacted.     Plan tonight is to hold her beta-blocker, continue Amiodarone drip as long as MAP>60, give Eliquis PO and Ativan IV so the patient can rest.  Family will be here in the morning with possible full comfort measures tomorrow.  Daughter did stop by after call and agreed with treatment will continue to monitor.   Problem: Education: Goal: Ability to describe self-care measures that may prevent or decrease complications (Diabetes Survival Skills Education) will improve Outcome: Progressing   Problem: Coping: Goal: Ability to adjust to condition or change in health will improve Outcome: Progressing   Problem: Pain Managment: Goal: General experience of comfort will improve Outcome: Progressing

## 2023-03-23 NOTE — Progress Notes (Signed)
1400-Pt passed at 1400. Pts family is at bedside. All questions were answered for the family. MD was notified.

## 2023-03-23 NOTE — Death Summary Note (Addendum)
DEATH SUMMARY   Patient Details  Name: Daisy Parks MRN: [id number] DOB: 20-Oct-1931 WUJ:[id number], Cheral Marker, MD Admission/Discharge Information   Admit Date:  22-Feb-2023  Date of Death:  2023-02-26  Time of Death:  2:00 PM  Length of Stay: 4   Principle Cause of death: Acute hypoxic respiratory failure  Hospital Diagnoses: Principal Problem:   Acute respiratory failure with hypoxia (HCC) Active Problems:   Community acquired pneumonia   Acute heart failure with preserved ejection fraction (HFpEF) (HCC)   Essential hypertension   Chronic atrial fibrillation with RVR (HCC)   Hypokalemia   Primary lung adenocarcinoma [facility])   Hospital Course: Taken from prior notes.  89 year old with history of lung cancer with mets follows at [facility] oncology, atrial fibrillation on Eliquis, HTN, HLD comes to the hospital with complaints of progressive dyspnea on exertion and shortness of breath.  Upon admission found to have pneumonia started on IV antibiotics and required BiPAP.  Patient was thought to be in volume overload requiring IV Lasix.  Slowly being transition to high flow nasal cannula.  Pulmonary consulted and patient underwent thoracentesis with removal of 450 cc fluid, preliminary cultures negative.  Patient continued to have worsening respiratory status.  Did required BiPAP and later transitioned to heated high flow at 100% FiO2 and 60 L of oxygen.  She was also have A-fib with RVR, started on amiodarone infusion.  Patient is DNR/DNI and if continued to get worse then most likely be transition to comfort care.  Palliative care was also consulted with meeting scheduled for 02/21/2023.  6/1: Patient remained on 100% FiO2 and 60 L of oxygen and barely saturating in low to mid 80s.  Multiple family members at bedside.  Remained in RVR with softer blood pressure. Worsening leukocytosis.  Palliative care meeting later today.  02-26-23: Patient remained on maximum setting of heated high  flow.  Saturating barely in 80s.  Family decided to transition her to full comfort care.  Patient later passed at 2 PM in the presence of multiple family members.   Assessment and Plan: * Acute respiratory failure with hypoxia (HCC) Bilateral pleural effusions.  S/p thoracentesis by pulmonary.  Preliminary pleural fluid cultures negative.  Cytology pending. Remained on 100% FiO2 and 60 L of oxygen, remained hypoxic. Patient is DNR and DNI.  Palliative care was consulted, Patient continued to on maximum setting of heated high flow. Family decided to transition her to full comfort care. -Comfort care initiated  Community acquired pneumonia Worsening procalcitonin at 1.77. -Switch ceftriaxone with cefepime -Family has decided to withdraw treatment and now she is full comfort care  Acute heart failure with preserved ejection fraction (HFpEF) (HCC) Patient with elevated BNP and signs of volume overload. -Continue with 40 mg twice daily IV Lasix -Daily BMP and weight -Strict intake and output  Essential hypertension Patient is on Toprol and IV Lasix  Chronic atrial fibrillation with RVR (HCC) Patient remained in RVR.  Cardiology also started digoxin. -Continue with amiodarone and Toprol -Continue with Eliquis  Hypokalemia Resolved with repletion. -Continue to monitor as she is being diuresed  Primary lung adenocarcinoma [facility]) Patient with history of stage II adenocarcinoma which is being managed by outpatient oncology. Also has an history of prior breast cancer   Procedures: None  Consultations: PCCM  The results of significant diagnostics from this hospitalization (including imaging, microbiology, ancillary and laboratory) are listed below for reference.   Significant Diagnostic Studies: ECHOCARDIOGRAM COMPLETE  Result Date: 02/21/2023  ECHOCARDIOGRAM REPORT   Patient Name:   Meridee Score Date of Exam: 02/21/2023 Medical Rec #:  [id number]    Height:       65.0 in  Accession #:    [id number]   Weight:       134.0 lb Date of Birth:  [birth year]    BSA:          1.669 m Patient Age:    89 years     BP:           117/90 mmHg Patient Gender: F            HR:           136 bpm. Exam Location:  [facility] Procedure: 2D Echo Indications:     CHF I50.21  History:         Patient has no prior history of Echocardiogram examinations.  Sonographer:     Overton Mam RDCS, FASE Referring Phys:  [id number] Judithe Modest Diagnosing Phys: Chilton Si MD IMPRESSIONS  1. Left ventricular ejection fraction, by estimation, is 30 to 35%. Left ventricular ejection fraction by 2D MOD biplane is 31.5 %. The left ventricle has moderately decreased function. The left ventricle demonstrates global hypokinesis. There is moderate concentric left ventricular hypertrophy. Left ventricular diastolic parameters are indeterminate. Elevated left ventricular end-diastolic pressure.  2. Right ventricular systolic function is normal. The right ventricular size is normal. There is moderately elevated pulmonary artery systolic pressure.  3. Left atrial size was moderately dilated.  4. The mitral valve is normal in structure. Mild mitral valve regurgitation. No evidence of mitral stenosis.  5. The aortic valve is tricuspid. Aortic valve regurgitation is not visualized. No aortic stenosis is present.  6. The inferior vena cava is normal in size with <50% respiratory variability, suggesting right atrial pressure of 8 mmHg. FINDINGS  Left Ventricle: Left ventricular ejection fraction, by estimation, is 30 to 35%. Left ventricular ejection fraction by 2D MOD biplane is 31.5 %. The left ventricle has moderately decreased function. The left ventricle demonstrates global hypokinesis. The left ventricular internal cavity size was normal in size. There is moderate concentric left ventricular hypertrophy. Abnormal (paradoxical) septal motion, consistent with left bundle branch block. Left ventricular diastolic parameters are  indeterminate. Elevated left ventricular end-diastolic pressure. Right Ventricle: The right ventricular size is normal. No increase in right ventricular wall thickness. Right ventricular systolic function is normal. There is moderately elevated pulmonary artery systolic pressure. The tricuspid regurgitant velocity is 3.11 m/s, and with an assumed right atrial pressure of 8 mmHg, the estimated right ventricular systolic pressure is 46.7 mmHg. Left Atrium: Left atrial size was moderately dilated. Right Atrium: Right atrial size was normal in size. Pericardium: There is no evidence of pericardial effusion. Mitral Valve: The mitral valve is normal in structure. Mild mitral annular calcification. Mild mitral valve regurgitation. No evidence of mitral valve stenosis. Tricuspid Valve: The tricuspid valve is normal in structure. Tricuspid valve regurgitation is mild . No evidence of tricuspid stenosis. Aortic Valve: The aortic valve is tricuspid. Aortic valve regurgitation is not visualized. No aortic stenosis is present. Aortic valve peak gradient measures 4.3 mmHg. Pulmonic Valve: The pulmonic valve was normal in structure. Pulmonic valve regurgitation is trivial. No evidence of pulmonic stenosis. Aorta: The aortic root is normal in size and structure. Venous: The inferior vena cava is normal in size with less than 50% respiratory variability, suggesting right atrial pressure of 8 mmHg. IAS/Shunts: No atrial level shunt detected by  color flow Doppler. Additional Comments: A device lead is visualized. There is a small pleural effusion in the left lateral region.  LEFT VENTRICLE PLAX 2D                        Biplane EF (MOD) LVIDd:         4.80 cm         LV Biplane EF:   Left LVIDs:         3.80 cm                          ventricular LV PW:         1.40 cm                          ejection LV IVS:        1.40 cm                          fraction by LVOT diam:     1.80 cm                          2D MOD LV SV:         26                                biplane is LV SV Index:   15                               31.5 %. LVOT Area:     2.54 cm                                Diastology                                LV e' medial:   6.42 cm/s LV Volumes (MOD)               LV E/e' medial: 22.1 LV vol d, MOD    56.0 ml A2C: LV vol d, MOD    52.3 ml A4C: LV vol s, MOD    34.4 ml A2C: LV vol s, MOD    40.5 ml A4C: LV SV MOD A2C:   21.6 ml LV SV MOD A4C:   52.3 ml LV SV MOD BP:    17.6 ml RIGHT VENTRICLE RV Basal diam:  3.20 cm RV S prime:     11.40 cm/s TAPSE (M-mode): 1.5 cm LEFT ATRIUM             Index        RIGHT ATRIUM           Index LA diam:        4.60 cm 2.76 cm/m   RA Area:     14.10 cm LA Vol (A2C):   70.0 ml 41.95 ml/m  RA Volume:   33.60 ml  20.13 ml/m LA Vol (A4C):   52.6 ml 31.52 ml/m LA Biplane Vol: 64.2 ml 38.47 ml/m  AORTIC VALVE  PULMONIC VALVE AV Area (Vmax): 1.65 cm     PV Vmax:       0.85 m/s AV Vmax:        104.00 cm/s  PV Peak grad:  2.9 mmHg AV Peak Grad:   4.3 mmHg LVOT Vmax:      67.30 cm/s LVOT Vmean:     40.800 cm/s LVOT VTI:       0.101 m  AORTA Ao Root diam: 3.10 cm Ao Asc diam:  2.80 cm MITRAL VALVE                  TRICUSPID VALVE MV Area (PHT): 5.27 cm       TR Peak grad:   38.7 mmHg MV Decel Time: 144 msec       TR Vmax:        311.00 cm/s MR Peak grad:    83.2 mmHg MR Mean grad:    48.0 mmHg    SHUNTS MR Vmax:         456.00 cm/s  Systemic VTI:  0.10 m MR Vmean:        327.0 cm/s   Systemic Diam: 1.80 cm MR PISA:         2.26 cm MR PISA Eff ROA: 15 mm MR PISA Radius:  0.60 cm MV E velocity: 142.00 cm/s Chilton Si MD Electronically signed by Chilton Si MD Signature Date/Time: 02/21/2023/1:58:51 PM    Final    DG Chest Port 1 View  Result Date: 02/21/2023 CLINICAL DATA:  [id number] with acute respiratory failure with hypoxia. EXAM: PORTABLE CHEST 1 VIEW COMPARISON:  Portable chest yesterday at 4:09 p.m. FINDINGS: 5:04 a.m. Right chest dual lead pacing system and wire  insertions are unaltered as well as multiple overlying monitor wires. Extensive diffuse dense bilateral airspace disease with underlying interstitial prominence shows no interval improvement or worsening. There are small pleural effusions also unchanged. The heart is enlarged. The central vasculature is obscured by the airspace disease. There is calcification of the transverse aorta with stable mediastinum. Old left axillary surgical clips. Mild osteopenia and degenerative change.  No acute osseous findings. IMPRESSION: No interval improvement or worsening of diffuse dense bilateral airspace disease with underlying interstitial prominence. Stable small pleural effusions. Stable cardiomegaly. Electronically Signed   By: Almira Bar M.D.   On: 02/21/2023 07:02   DG Chest Port 1 View  Result Date: 02/20/2023 CLINICAL DATA:  [id number] Pleural effusion [id number] status post right thoracentesis EXAM: PORTABLE CHEST 1 VIEW COMPARISON:  Multiple priors FINDINGS: Cardiomediastinal silhouette is obscured by diffuse alveolar airspace disease. Findings are not significantly changed. No significant pleural effusion. No pneumothorax. No acute osseous abnormality. There is a 2 lead implantable cardiac device. Surgical clips overlie the left chest. IMPRESSION: No findings of pneumothorax following right thoracentesis. Similar pattern of diffuse alveolar airspace disease. Electronically Signed   By: Olive Bass M.D.   On: 02/20/2023 16:38   US THORACENTESIS ASP PLEURAL SPACE W/IMG GUIDE  Result Date: 02/20/2023 INDICATION: 89 year old with history of lung cancer with mets admitted for pneumonia found to have bilateral moderate pleural effusions. Request received for diagnostic and therapeutic right thoracentesis. EXAM: ULTRASOUND GUIDED DIAGNOSTIC AND THERAPEUTIC RIGHT THORACENTESIS MEDICATIONS: 10 mL 1 % lidocaine COMPLICATIONS: None immediate. PROCEDURE: An ultrasound guided thoracentesis was thoroughly discussed with the  patient and questions answered. The benefits, risks, alternatives and complications were also discussed. The patient understands and wishes to proceed with the procedure. Written consent was obtained. Ultrasound was performed to  localize and mark an adequate pocket of fluid in the right chest. The area was then prepped and draped in the normal sterile fashion. 1% Lidocaine was used for local anesthesia. Under ultrasound guidance a 6 Fr Safe-T-Centesis catheter was introduced. Thoracentesis was performed. The catheter was removed and a dressing applied. FINDINGS: A total of approximately 450 cc of hazy, amber fluid was removed. Samples were sent to the laboratory as requested by the clinical team. IMPRESSION: Successful ultrasound guided right thoracentesis yielding 450 cc of pleural fluid. Procedure was performed by Alex Gardener, NP and supervised by Olive Bass, MD Electronically Signed   By: Olive Bass M.D.   On: 02/20/2023 16:15   DG Chest Port 1 View  Result Date: 02/20/2023 CLINICAL DATA:  Dyspnea EXAM: PORTABLE CHEST 1 VIEW COMPARISON:  Feb 19, 2023. FINDINGS: Progressive extensive interstitial and airspace opacities throughout both lungs. Enlarged cardiac silhouette, partially obscured. Right subclavian cardiac rhythm maintenance device. No visible pneumothorax or large pleural effusions. Polyarticular degenerative change. IMPRESSION: Progressive extensive interstitial and airspace opacities throughout both lungs, most likely edema. Superimposed infection is not excluded. Electronically Signed   By: Feliberto Harts M.D.   On: 02/20/2023 11:40   CT CHEST WO CONTRAST  Result Date: 02/19/2023 CLINICAL DATA:  Abnormal chest radiograph EXAM: CT CHEST WITHOUT CONTRAST TECHNIQUE: Multidetector CT imaging of the chest was performed following the standard protocol without IV contrast. RADIATION DOSE REDUCTION: This exam was performed according to the departmental dose-optimization program which includes  automated exposure control, adjustment of the mA and/or kV according to patient size and/or use of iterative reconstruction technique. COMPARISON:  Chest x-ray dated Feb 18, 2023 FINDINGS: Cardiovascular: Cardiomegaly. No pericardial effusion. Normal caliber thoracic aorta with severe calcified plaque. Severe three-vessel coronary artery calcifications. Right chest wall dual lead pacer with leads in the right ventricle and right atrium. Mediastinum/Nodes: Esophagus and thyroid are unremarkable. No enlarged lymph nodes seen in the chest. Lungs/Pleura: Central airways are patent. Evaluation of the lungs is somewhat limited due to respiratory motion artifact. Central predominant ground-glass opacities with scattered more confluent areas of consolidation and associated septal thickening. Moderate right and small left pleural effusions and bibasilar atelectasis. Upper Abdomen: Scattered low-attenuation liver lesions which are likely simple cysts. No acute abnormality. Musculoskeletal: No chest wall mass or suspicious bone lesions identified. IMPRESSION: 1. Central predominant ground-glass opacities with scattered more confluent areas of consolidation and associated septal thickening, findings are likely due to pulmonary edema. 2. Moderate right and small left pleural effusions. 3. Cardiomegaly. 4. Severe coronary artery and aortic atherosclerosis (ICD10-I70.0). Electronically Signed   By: Allegra Lai M.D.   On: 02/19/2023 11:25   DG Chest 1 View  Result Date: 01/22/2023 CLINICAL DATA:  Hypoxia EXAM: CHEST  1 VIEW COMPARISON:  08/05/2022 FINDINGS: Transverse diameter of heart is increased. Central pulmonary vessels are prominent. Increased interstitial and alveolar markings are seen in both parahilar regions, more so on the right side. Increased interstitial and alveolar markings are seen in the lower lung fields, more so on the right side. There is blunting of lateral CP angles. There is no pneumothorax.  Pacemaker battery is seen in the right infraclavicular region. Surgical clips are seen in left chest wall. IMPRESSION: Cardiomegaly. Extensive new interstitial and alveolar densities are seen in the parahilar regions and lower lung fields, more so on the right side. Findings suggest possible pulmonary edema or extensive bilateral multifocal pneumonia. Electronically Signed   By: Ernie Avena M.D.   On: 02/04/2023 21:08  Microbiology: Recent Results (from the past 240 hour(s))  SARS Coronavirus 2 by RT PCR (hospital order, performed in [facility] hospital lab) *cepheid single result test* Anterior Nasal Swab     Status: None   Collection Time: 02/07/2023  7:59 PM   Specimen: Anterior Nasal Swab  Result Value Ref Range Status   SARS Coronavirus 2 by RT PCR NEGATIVE NEGATIVE Final    Comment: (NOTE) SARS-CoV-2 target nucleic acids are NOT DETECTED.  The SARS-CoV-2 RNA is generally detectable in upper and lower respiratory specimens during the acute phase of infection. The lowest concentration of SARS-CoV-2 viral copies this assay can detect is 250 copies / mL. A negative result does not preclude SARS-CoV-2 infection and should not be used as the sole basis for treatment or other patient management decisions.  A negative result may occur with improper specimen collection / handling, submission of specimen other than nasopharyngeal swab, presence of viral mutation(s) within the areas targeted by this assay, and inadequate number of viral copies (<250 copies / mL). A negative result must be combined with clinical observations, patient history, and epidemiological information.  Fact Sheet for Patients:   RoadLapTop.co.za  Fact Sheet for Healthcare Providers: http://kim-miller.com/  This test is not yet approved or  cleared by the [place] FDA and has been authorized for detection and/or diagnosis of SARS-CoV-2 by FDA under an Emergency  Use Authorization (EUA).  This EUA will remain in effect (meaning this test can be used) for the duration of the COVID-19 declaration under Section 564(b)(1) of the Act, 21 U.S.C. section 360bbb-3(b)(1), unless the authorization is terminated or revoked sooner.  Performed at [facility], [address] [place], [place] [address]   Culture, blood (Routine X 2) w Reflex to ID Panel     Status: Abnormal   Collection Time: 01/25/2023  7:59 PM   Specimen: BLOOD RIGHT ARM  Result Value Ref Range Status   Specimen Description   Final    BLOOD RIGHT ARM Performed at [facility], [address] [place] [address], [place], [place] [address]    Special Requests   Final    BOTTLES DRAWN AEROBIC AND ANAEROBIC Blood Culture adequate volume Performed at [facility], [address], [place], [place] [address]    Culture  Setup Time   Final    GRAM POSITIVE COCCI ANAEROBIC BOTTLE ONLY Organism ID to follow CRITICAL RESULT CALLED TO, READ BACK BY AND VERIFIED WITH: Elenora Gamma, PHARMD AT 1128 ON 02/19/23 BY GM Performed at [facility], [address] [place], [place] [address]    Culture (A)  Final    STAPHYLOCOCCUS SAPROPHYTICUS THE SIGNIFICANCE OF ISOLATING THIS ORGANISM FROM A SINGLE SET OF BLOOD CULTURES WHEN MULTIPLE SETS ARE DRAWN IS UNCERTAIN. PLEASE NOTIFY THE MICROBIOLOGY DEPARTMENT WITHIN ONE WEEK IF SPECIATION AND SENSITIVITIES ARE REQUIRED. Performed at [facility], [address], [place], [place] [address]    Report Status Feb 25, 2023 FINAL  Final  Blood Culture ID Panel (Reflexed)     Status: Abnormal   Collection Time: 01/25/2023  7:59 PM  Result Value Ref Range Status   Enterococcus faecalis NOT DETECTED NOT DETECTED Final   Enterococcus Faecium NOT DETECTED NOT DETECTED Final   Listeria monocytogenes NOT DETECTED NOT DETECTED Final   Staphylococcus species DETECTED (A) NOT DETECTED Final    Comment: CRITICAL RESULT CALLED TO, READ BACK BY AND  VERIFIED WITH: NATHAN BLUE, PHARMD AT 1128 ON 02/19/23 BY GM    Staphylococcus aureus (  BCID) NOT DETECTED NOT DETECTED Final   Staphylococcus epidermidis NOT DETECTED NOT DETECTED Final   Staphylococcus lugdunensis NOT DETECTED NOT DETECTED Final   Streptococcus species NOT DETECTED NOT DETECTED Final   Streptococcus agalactiae NOT DETECTED NOT DETECTED Final   Streptococcus pneumoniae NOT DETECTED NOT DETECTED Final   Streptococcus pyogenes NOT DETECTED NOT DETECTED Final   A.calcoaceticus-baumannii NOT DETECTED NOT DETECTED Final   Bacteroides fragilis NOT DETECTED NOT DETECTED Final   Enterobacterales NOT DETECTED NOT DETECTED Final   Enterobacter cloacae complex NOT DETECTED NOT DETECTED Final   Escherichia coli NOT DETECTED NOT DETECTED Final   Klebsiella aerogenes NOT DETECTED NOT DETECTED Final   Klebsiella oxytoca NOT DETECTED NOT DETECTED Final   Klebsiella pneumoniae NOT DETECTED NOT DETECTED Final   Proteus species NOT DETECTED NOT DETECTED Final   Salmonella species NOT DETECTED NOT DETECTED Final   Serratia marcescens NOT DETECTED NOT DETECTED Final   Haemophilus influenzae NOT DETECTED NOT DETECTED Final   Neisseria meningitidis NOT DETECTED NOT DETECTED Final   Pseudomonas aeruginosa NOT DETECTED NOT DETECTED Final   Stenotrophomonas maltophilia NOT DETECTED NOT DETECTED Final   Candida albicans NOT DETECTED NOT DETECTED Final   Candida auris NOT DETECTED NOT DETECTED Final   Candida glabrata NOT DETECTED NOT DETECTED Final   Candida krusei NOT DETECTED NOT DETECTED Final   Candida parapsilosis NOT DETECTED NOT DETECTED Final   Candida tropicalis NOT DETECTED NOT DETECTED Final   Cryptococcus neoformans/gattii NOT DETECTED NOT DETECTED Final    Comment: Performed at [facility], [address] [place], [place] [address]  Culture, blood (Routine X 2) w Reflex to ID Panel     Status: None (Preliminary result)   Collection Time: 03/11/23 10:29 PM   Specimen:  BLOOD  Result Value Ref Range Status   Specimen Description BLOOD HAND  Final   Special Requests   Final    BOTTLES DRAWN AEROBIC AND ANAEROBIC Blood Culture adequate volume   Culture   Final    NO GROWTH 4 DAYS Performed at [facility] [address] [place], [place] [address]    Report Status PENDING  Incomplete  MRSA Next Gen by PCR, Nasal     Status: None   Collection Time: 02/19/23 11:15 AM   Specimen: Nasal Mucosa; Nasal Swab  Result Value Ref Range Status   MRSA by PCR Next Gen NOT DETECTED NOT DETECTED Final    Comment: (NOTE) The GeneXpert MRSA Assay (FDA approved for NASAL specimens only), is one component of a comprehensive MRSA colonization surveillance program. It is not intended to diagnose MRSA infection nor to guide or monitor treatment for MRSA infections. Test performance is not FDA approved in patients less than 33 years old. Performed at [facility], [address] [place], [place] [address]   Respiratory (~20 pathogens) panel by PCR     Status: None   Collection Time: 02/19/23 11:26 AM   Specimen: Anterior Nasal Swab; Respiratory  Result Value Ref Range Status   Adenovirus NOT DETECTED NOT DETECTED Final   Coronavirus 229E NOT DETECTED NOT DETECTED Final    Comment: (NOTE) The Coronavirus on the Respiratory Panel, DOES NOT test for the novel  Coronavirus (2019 nCoV)    Coronavirus HKU1 NOT DETECTED NOT DETECTED Final   Coronavirus NL63 NOT DETECTED NOT DETECTED Final   Coronavirus OC43 NOT DETECTED NOT DETECTED Final   Metapneumovirus NOT DETECTED NOT DETECTED Final   Rhinovirus / Enterovirus NOT DETECTED NOT DETECTED Final  Influenza A NOT DETECTED NOT DETECTED Final   Influenza B NOT DETECTED NOT DETECTED Final   Parainfluenza Virus 1 NOT DETECTED NOT DETECTED Final   Parainfluenza Virus 2 NOT DETECTED NOT DETECTED Final   Parainfluenza Virus 3 NOT DETECTED NOT DETECTED Final   Parainfluenza Virus 4 NOT DETECTED NOT DETECTED  Final   Respiratory Syncytial Virus NOT DETECTED NOT DETECTED Final   Bordetella pertussis NOT DETECTED NOT DETECTED Final   Bordetella Parapertussis NOT DETECTED NOT DETECTED Final   Chlamydophila pneumoniae NOT DETECTED NOT DETECTED Final   Mycoplasma pneumoniae NOT DETECTED NOT DETECTED Final    Comment: Performed at [facility], [address] [place], [place] [address]  Body fluid culture w Gram Stain     Status: None (Preliminary result)   Collection Time: 02/20/23  4:06 PM   Specimen: PATH Cytology Pleural fluid  Result Value Ref Range Status   Specimen Description   Final    PLEURAL Performed at [facility], [address], [place], [place] [address]    Special Requests   Final    NONE Performed at [facility], [address] [place], [place] [address]    Gram Stain   Final    WBC PRESENT, PREDOMINANTLY MONONUCLEAR NO ORGANISMS SEEN CYTOSPIN SMEAR    Culture   Final    NO GROWTH < 24 HOURS Performed at [facility], [address], [place], [place] [address]    Report Status PENDING  Incomplete  Culture, blood (Routine X 2) w Reflex to ID Panel     Status: None (Preliminary result)   Collection Time: 02/20/23  7:23 PM   Specimen: BLOOD  Result Value Ref Range Status   Specimen Description BLOOD BLOOD RIGHT HAND  Final   Special Requests   Final    BOTTLES DRAWN AEROBIC AND ANAEROBIC Blood Culture results may not be optimal due to an inadequate volume of blood received in culture bottles   Culture   Final    NO GROWTH 2 DAYS Performed at [facility], [address], [place], [place] [address]    Report Status PENDING  Incomplete  Culture, blood (Routine X 2) w Reflex to ID Panel     Status: None (Preliminary result)   Collection Time: 02/20/23  7:23 PM   Specimen: BLOOD  Result Value Ref Range Status   Specimen Description BLOOD BLOOD RIGHT ARM  Final   Special Requests   Final    BOTTLES DRAWN AEROBIC AND ANAEROBIC  Blood Culture results may not be optimal due to an inadequate volume of blood received in culture bottles   Culture   Final    NO GROWTH 2 DAYS Performed at [facility], [address] [place], [place] [address]    Report Status PENDING  Incomplete    Time spent: More than 30 minutes  Signed: Arnetha Courser, MD 2023-03-07

## 2023-03-23 NOTE — Assessment & Plan Note (Signed)
Bilateral pleural effusions.  S/p thoracentesis by pulmonary.  Preliminary pleural fluid cultures negative.  Cytology pending. Remained on 100% FiO2 and 60 L of oxygen, remained hypoxic. Patient is DNR and DNI.  Palliative care was consulted, Patient continued to on maximum setting of heated high flow. Family decided to transition her to full comfort care. -Comfort care initiated

## 2023-03-23 NOTE — Progress Notes (Deleted)
Daily Progress Note   Patient Name: Daisy Parks       Date: 03/05/2023 DOB: 05/12/1932  Age: 89 y.o. MRN#: [id number] Attending Physician: No att. providers found Primary Care Physician: Kurtis Bushman, MD Admit Date: 02/21/2023  Reason for Consultation/Follow-up: Establishing goals of care  HPI/Brief Hospital Review:  89 y.o. female  with past medical history of lung cancer with metastasis followed by [facility] oncology, atrial fibrillation on Eliquis, HTN and HLD admitted from home on 02/14/2023 with progressive dyspnea on exertion and shortness of breath.     Found to have bilateral pleural effusions with interstitial edema and hypotension Underwent US guided thoracentesis 5/31 for which 450cc fluid was removed   5/31 overnight events: period of severe hypoxia secondary to her removal of HFNC and development of A. Fib with RVR Intermittently placed on BIPAP and started on Amiodarone infusion   Palliative medicine was consulted for assisting with goals of care conversations.  Subjective: Extensive chart review has been completed prior to meeting patient including labs, vital signs, imaging, progress notes, orders, and available advanced directive documents from current and previous encounters.    Visited with family at bedside this AM. Discussed noted ongoing decline, recommended full transition to comfort as discussed 6/1. Family present requested time for daughter-Pam and son-Tim to be called to bedside.  Multiple family and close friends visiting bedside throughout morning. Answered and addressed all questions and concerns. Ongoing emotional support provided. Once daughter-Pam and son-Tim arrived decision made to fully transition to comfort measures including ensuring comfort with  administration of as needed morphine with liberation of high levels of oxygen therapy and amiodarone infusion. Family in agreement with plan of care.  Family requested my presence at bedside during time of transition. Answered questions surrounding end of life and symptoms to be expected or anticipated. Ensured ongoing comfort. Ongoing emotional support provided.  Confirmed TOD with nursing staff 1400.  Thank you for allowing the Palliative Medicine Team to assist in the care of this patient.  Total time:  65 minutes  Time spent includes: Detailed review of medical records (labs, imaging, vital signs), medically appropriate exam (mental status, respiratory, cardiac, skin), discussed with treatment team, counseling and educating patient, family and staff, documenting clinical information, medication management and coordination of care.  Leeanne Deed, DNP, AGNP-C Palliative Medicine  Please contact Palliative Medicine Team phone at [phone] for questions and concerns.

## 2023-03-23 NOTE — Assessment & Plan Note (Signed)
Worsening procalcitonin at 1.77. -Switch ceftriaxone with cefepime -Family has decided to withdraw treatment and now she is full comfort care

## 2023-03-23 NOTE — Progress Notes (Signed)
Progress Note   Patient: Daisy Parks ZOX:[id number] DOB: 1932/04/29 DOA: 02/04/2023     4 DOS: the patient was seen and examined on 02/23/2023   Brief hospital course: Taken from prior notes.  89 year old with history of lung cancer with mets follows at [facility] oncology, atrial fibrillation on Eliquis, HTN, HLD comes to the hospital with complaints of progressive dyspnea on exertion and shortness of breath.  Upon admission found to have pneumonia started on IV antibiotics and required BiPAP.  Patient was thought to be in volume overload requiring IV Lasix.  Slowly being transition to high flow nasal cannula.  Pulmonary consulted and patient underwent thoracentesis with removal of 450 cc fluid, preliminary cultures negative.  Patient continued to have worsening respiratory status.  Did required BiPAP and later transitioned to heated high flow at 100% FiO2 and 60 L of oxygen.  She was also have A-fib with RVR, started on amiodarone infusion.  Patient is DNR/DNI and if continued to get worse then most likely be transition to comfort care.  Palliative care was also consulted with meeting scheduled for 02/21/2023.  6/1: Patient remained on 100% FiO2 and 60 L of oxygen and barely saturating in low to mid 80s.  Multiple family members at bedside.  Remained in RVR with softer blood pressure. Worsening leukocytosis.  Palliative care meeting later today.  6/2: Patient remained on maximum setting of heated high flow.  Saturating barely in 80s.  Family decided to transition her to full comfort care.     Assessment and Plan: * Acute respiratory failure with hypoxia (HCC) Bilateral pleural effusions.  S/p thoracentesis by pulmonary.  Preliminary pleural fluid cultures negative.  Cytology pending. Remained on 100% FiO2 and 60 L of oxygen, remained hypoxic. Patient is DNR and DNI.  Palliative care was consulted, Patient continued to on maximum setting of heated high flow. Family decided to transition  her to full comfort care. -Comfort care initiated  Community acquired pneumonia Worsening procalcitonin at 1.77. -Switch ceftriaxone with cefepime -Family has decided to withdraw treatment and now she is full comfort care  Acute heart failure with preserved ejection fraction (HFpEF) (HCC) Patient with elevated BNP and signs of volume overload. -Continue with 40 mg twice daily IV Lasix -Daily BMP and weight -Strict intake and output  Essential hypertension Patient is on Toprol and IV Lasix  Chronic atrial fibrillation with RVR (HCC) Patient remained in RVR.  Cardiology also started digoxin. -Continue with amiodarone and Toprol -Continue with Eliquis  Hypokalemia Resolved with repletion. -Continue to monitor as she is being diuresed  Primary lung adenocarcinoma [facility]) Patient with history of stage II adenocarcinoma which is being managed by outpatient oncology. Also has an history of prior breast cancer   Subjective: Patient remained very lethargic.  Multiple family members at bedside.  Being transitioned to full comfort care.  Physical Exam: Vitals:   02/27/2023 0509 03/16/2023 0600 03/03/2023 0701 03/04/2023 0739  BP: (!) 133/98     Pulse: (!) 109 66    Resp: (!) 24 (!) 27    Temp:   98.2 F (36.8 C)   TempSrc:   Axillary   SpO2: 90% 91%  (!) 88%  Weight:      Height:       General.  Frail and lethargic elderly lady, in no acute distress. Pulmonary.  Scattered rhonchi bilaterally, normal respiratory effort. CV.  Regular rate and rhythm, no JVD, rub or murmur. Abdomen.  Soft, nontender, nondistended, BS positive. CNS.  Lethargic.  No focal neurologic deficit. Extremities.  No edema, no cyanosis, pulses intact and symmetrical.  Data Reviewed:   Family Communication: Multiple family members at bedside  Disposition: Status is: Inpatient Remains inpatient appropriate because: Severity of illness  Planned Discharge Destination:  To be determined  DVT prophylaxis.   Eliquis Time spent: 50 minutes  This record has been created using Conservation officer, historic buildings. Errors have been sought and corrected,but may not always be located. Such creation errors do not reflect on the standard of care.   Author: Arnetha Courser, MD 02/26/2023 1:01 PM  For on call review [email or web address].

## 2023-03-23 NOTE — Progress Notes (Signed)
CRITICAL CARE PROGRESS NOTE    Name: Daisy Parks MRN: [id number] DOB: 1932-07-11     LOS: 4   SUBJECTIVE FINDINGS & SIGNIFICANT EVENTS    Patient description:  Very pleasant 89 yo F with hx of advanced lung cancer follow med/onc with [facility], atrial fibrillation on Eliquis, HTN, HLD comes to the hospital with complaints of progressive dyspnea on exertion and shortness of breath.  She has had treatment for lung cancer and was discussing radiation therapy with oncology.  She was noted to have bilateral pleural effusions worse on right with interstitial edema and hypotension. She is on eliquis.  We had Throacentesis performed via IR service and 450cc fluid has been removed. She has gram + cocci growing on blood cultures. Daugther is at bedside and we discussed goals of care.  She has met with palliative care and appears to have transitioned to DNR.   02/21/23- patient continues to show signs of dyspnea and distress.  Continues to have conversations with palliative.  S/p Midline insertion today. Patient on amiodarone.   02/23/2023- Patient is transitioning to comfort care measures.  Palliative care team is meeting with family.  Family is at bedside this am.  Patient is on high flow nasal canula receiving full scope of therapy at this time without escalation of care.   Lines/tubes : External Urinary Catheter (Active)  Collection Container Dedicated Suction Canister 02/20/23 0800  Suction (Verified suction is between 40-80 mmHg) Yes 02/20/23 0800  Securement Method Securing device (Describe) 02/20/23 0800  Site Assessment Clean, Dry, Intact 02/20/23 0800  Intervention Female External Urinary Catheter Replaced 02/20/23 0335  Output (mL) 300 mL 02/20/23 0300    Microbiology/Sepsis markers: Results for orders placed or  performed during the hospital encounter of 02/14/2023  SARS Coronavirus 2 by RT PCR (hospital order, performed in [facility] hospital lab) *cepheid single result test* Anterior Nasal Swab     Status: None   Collection Time: 02/03/2023  7:59 PM   Specimen: Anterior Nasal Swab  Result Value Ref Range Status   SARS Coronavirus 2 by RT PCR NEGATIVE NEGATIVE Final    Comment: (NOTE) SARS-CoV-2 target nucleic acids are NOT DETECTED.  The SARS-CoV-2 RNA is generally detectable in upper and lower respiratory specimens during the acute phase of infection. The lowest concentration of SARS-CoV-2 viral copies this assay can detect is 250 copies / mL. A negative result does not preclude SARS-CoV-2 infection and should not be used as the sole basis for treatment or other patient management decisions.  A negative result may occur with improper specimen collection / handling, submission of specimen other than nasopharyngeal swab, presence of viral mutation(s) within the areas targeted by this assay, and inadequate number of viral copies (<250 copies / mL). A negative result must be combined with clinical observations, patient history, and epidemiological information.  Fact Sheet for Patients:   RoadLapTop.co.za  Fact Sheet for Healthcare Providers: http://kim-miller.com/  This test is not yet approved or  cleared by the [place] FDA and has been authorized for detection and/or diagnosis of SARS-CoV-2 by FDA under an Emergency Use Authorization (EUA).  This EUA will remain in effect (meaning this test can be used) for the duration of the COVID-19 declaration under Section 564(b)(1) of the Act, 21 U.S.C. section 360bbb-3(b)(1), unless the authorization is terminated or revoked sooner.  Performed at [facility], [address] [place], [place] [address]   Culture, blood (Routine X 2) w Reflex to ID Panel  Status: Abnormal (Preliminary  result)   Collection Time: 02/10/2023  7:59 PM   Specimen: BLOOD RIGHT ARM  Result Value Ref Range Status   Specimen Description   Final    BLOOD RIGHT ARM Performed at [facility], [address], [place], [place] [address]    Special Requests   Final    BOTTLES DRAWN AEROBIC AND ANAEROBIC Blood Culture adequate volume Performed at [facility], [address], [place], [place] [address]    Culture  Setup Time   Final    GRAM POSITIVE COCCI ANAEROBIC BOTTLE ONLY Organism ID to follow CRITICAL RESULT CALLED TO, READ BACK BY AND VERIFIED WITH: Elenora Gamma, PHARMD AT 1128 ON 02/19/23 BY GM Performed at [facility], [address] [place], [place] [address]    Culture (A)  Final    STAPHYLOCOCCUS SAPROPHYTICUS THE SIGNIFICANCE OF ISOLATING THIS ORGANISM FROM A SINGLE SET OF BLOOD CULTURES WHEN MULTIPLE SETS ARE DRAWN IS UNCERTAIN. PLEASE NOTIFY THE MICROBIOLOGY DEPARTMENT WITHIN ONE WEEK IF SPECIATION AND SENSITIVITIES ARE REQUIRED. Performed at [facility], [address], [place], [place] [address]    Report Status PENDING  Incomplete  Blood Culture ID Panel (Reflexed)     Status: Abnormal   Collection Time: 02/13/2023  7:59 PM  Result Value Ref Range Status   Enterococcus faecalis NOT DETECTED NOT DETECTED Final   Enterococcus Faecium NOT DETECTED NOT DETECTED Final   Listeria monocytogenes NOT DETECTED NOT DETECTED Final   Staphylococcus species DETECTED (A) NOT DETECTED Final    Comment: CRITICAL RESULT CALLED TO, READ BACK BY AND VERIFIED WITH: NATHAN BLUE, PHARMD AT 1128 ON 02/19/23 BY GM    Staphylococcus aureus (BCID) NOT DETECTED NOT DETECTED Final   Staphylococcus epidermidis NOT DETECTED NOT DETECTED Final   Staphylococcus lugdunensis NOT DETECTED NOT DETECTED Final   Streptococcus species NOT DETECTED NOT DETECTED Final   Streptococcus agalactiae NOT DETECTED NOT DETECTED Final   Streptococcus pneumoniae NOT DETECTED NOT DETECTED  Final   Streptococcus pyogenes NOT DETECTED NOT DETECTED Final   A.calcoaceticus-baumannii NOT DETECTED NOT DETECTED Final   Bacteroides fragilis NOT DETECTED NOT DETECTED Final   Enterobacterales NOT DETECTED NOT DETECTED Final   Enterobacter cloacae complex NOT DETECTED NOT DETECTED Final   Escherichia coli NOT DETECTED NOT DETECTED Final   Klebsiella aerogenes NOT DETECTED NOT DETECTED Final   Klebsiella oxytoca NOT DETECTED NOT DETECTED Final   Klebsiella pneumoniae NOT DETECTED NOT DETECTED Final   Proteus species NOT DETECTED NOT DETECTED Final   Salmonella species NOT DETECTED NOT DETECTED Final   Serratia marcescens NOT DETECTED NOT DETECTED Final   Haemophilus influenzae NOT DETECTED NOT DETECTED Final   Neisseria meningitidis NOT DETECTED NOT DETECTED Final   Pseudomonas aeruginosa NOT DETECTED NOT DETECTED Final   Stenotrophomonas maltophilia NOT DETECTED NOT DETECTED Final   Candida albicans NOT DETECTED NOT DETECTED Final   Candida auris NOT DETECTED NOT DETECTED Final   Candida glabrata NOT DETECTED NOT DETECTED Final   Candida krusei NOT DETECTED NOT DETECTED Final   Candida parapsilosis NOT DETECTED NOT DETECTED Final   Candida tropicalis NOT DETECTED NOT DETECTED Final   Cryptococcus neoformans/gattii NOT DETECTED NOT DETECTED Final    Comment: Performed at [facility], [address] [place], [place] [address]  Culture, blood (Routine X 2) w Reflex to ID Panel     Status: None (Preliminary result)   Collection Time: 02/20/2023 10:29 PM   Specimen: BLOOD  Result Value  Ref Range Status   Specimen Description BLOOD HAND  Final   Special Requests   Final    BOTTLES DRAWN AEROBIC AND ANAEROBIC Blood Culture adequate volume   Culture   Final    NO GROWTH 4 DAYS Performed at [facility], [address] [place], [place] [address]    Report Status PENDING  Incomplete  MRSA Next Gen by PCR, Nasal     Status: None   Collection Time: 02/19/23 11:15 AM    Specimen: Nasal Mucosa; Nasal Swab  Result Value Ref Range Status   MRSA by PCR Next Gen NOT DETECTED NOT DETECTED Final    Comment: (NOTE) The GeneXpert MRSA Assay (FDA approved for NASAL specimens only), is one component of a comprehensive MRSA colonization surveillance program. It is not intended to diagnose MRSA infection nor to guide or monitor treatment for MRSA infections. Test performance is not FDA approved in patients less than 34 years old. Performed at [facility], [address] [place], [place] [address]   Respiratory (~20 pathogens) panel by PCR     Status: None   Collection Time: 02/19/23 11:26 AM   Specimen: Anterior Nasal Swab; Respiratory  Result Value Ref Range Status   Adenovirus NOT DETECTED NOT DETECTED Final   Coronavirus 229E NOT DETECTED NOT DETECTED Final    Comment: (NOTE) The Coronavirus on the Respiratory Panel, DOES NOT test for the novel  Coronavirus (2019 nCoV)    Coronavirus HKU1 NOT DETECTED NOT DETECTED Final   Coronavirus NL63 NOT DETECTED NOT DETECTED Final   Coronavirus OC43 NOT DETECTED NOT DETECTED Final   Metapneumovirus NOT DETECTED NOT DETECTED Final   Rhinovirus / Enterovirus NOT DETECTED NOT DETECTED Final   Influenza A NOT DETECTED NOT DETECTED Final   Influenza B NOT DETECTED NOT DETECTED Final   Parainfluenza Virus 1 NOT DETECTED NOT DETECTED Final   Parainfluenza Virus 2 NOT DETECTED NOT DETECTED Final   Parainfluenza Virus 3 NOT DETECTED NOT DETECTED Final   Parainfluenza Virus 4 NOT DETECTED NOT DETECTED Final   Respiratory Syncytial Virus NOT DETECTED NOT DETECTED Final   Bordetella pertussis NOT DETECTED NOT DETECTED Final   Bordetella Parapertussis NOT DETECTED NOT DETECTED Final   Chlamydophila pneumoniae NOT DETECTED NOT DETECTED Final   Mycoplasma pneumoniae NOT DETECTED NOT DETECTED Final    Comment: Performed at [facility], [address] [place], [place] [address]  Body fluid culture w Gram  Stain     Status: None (Preliminary result)   Collection Time: 02/20/23  4:06 PM   Specimen: PATH Cytology Pleural fluid  Result Value Ref Range Status   Specimen Description   Final    PLEURAL Performed at [facility], [address], [place], [place] [address]    Special Requests   Final    NONE Performed at [facility], [address] [place], [place] [address]    Gram Stain   Final    WBC PRESENT, PREDOMINANTLY MONONUCLEAR NO ORGANISMS SEEN CYTOSPIN SMEAR    Culture   Final    NO GROWTH < 24 HOURS Performed at [facility], [address] [place], [place] [address]    Report Status PENDING  Incomplete  Culture, blood (Routine X 2) w Reflex to ID Panel     Status: None (Preliminary result)   Collection Time: 02/20/23  7:23 PM   Specimen: BLOOD  Result Value Ref Range Status   Specimen Description BLOOD BLOOD RIGHT HAND  Final   Special Requests   Final    BOTTLES DRAWN AEROBIC AND ANAEROBIC Blood Culture results may not be optimal due to an inadequate volume of blood received in culture bottles   Culture   Final    NO GROWTH 2 DAYS Performed at [facility], [address] [place], [place] [address]    Report Status PENDING  Incomplete  Culture, blood (Routine X 2) w Reflex to ID Panel     Status: None (Preliminary result)   Collection Time: 02/20/23  7:23 PM   Specimen: BLOOD  Result Value Ref Range Status   Specimen Description BLOOD BLOOD RIGHT ARM  Final   Special Requests   Final    BOTTLES DRAWN AEROBIC AND ANAEROBIC Blood Culture results may not be optimal due to an inadequate volume of blood received in culture bottles   Culture   Final    NO GROWTH 2 DAYS Performed at [facility], [address], [place], [place] [address]    Report Status PENDING  Incomplete    Anti-infectives:  Anti-infectives (From admission, onward)    Start     Dose/Rate Route Frequency Ordered Stop   02/21/23 1500  ceFEPIme  (MAXIPIME) 2 g in sodium chloride 0.9 % 100 mL IVPB  Status:  Discontinued        2 g 200 mL/hr over 30 Minutes Intravenous Every 12 hours 02/21/23 1317 02/21/23 1427   02/09/2023 2215  cefTRIAXone (ROCEPHIN) 1 g in sodium chloride 0.9 % 100 mL IVPB  Status:  Discontinued        1 g 200 mL/hr over 30 Minutes Intravenous Every 24 hours 02/06/2023 2203 02/21/23 1302   02/20/2023 2215  azithromycin (ZITHROMAX) 500 mg in sodium chloride 0.9 % 250 mL IVPB        500 mg 250 mL/hr over 60 Minutes Intravenous Every 24 hours 01/31/2023 2203 02/21/23 1800   01/25/2023 2130  vancomycin (VANCOCIN) IVPB 1000 mg/200 mL premix  Status:  Discontinued        1,000 mg 200 mL/hr over 60 Minutes Intravenous  Once 02/17/2023 2118 02/20/2023 2120   01/27/2023 2130  ceFEPIme (MAXIPIME) 2 g in sodium chloride 0.9 % 100 mL IVPB  Status:  Discontinued        2 g 200 mL/hr over 30 Minutes Intravenous  Once 01/27/2023 2118 01/23/2023 2206   02/17/2023 2130  vancomycin (VANCOREADY) IVPB 1500 mg/300 mL        1,500 mg 150 mL/hr over 120 Minutes Intravenous  Once 02/04/2023 2120 02/19/23 0156         PAST MEDICAL HISTORY   Past Medical History:  Diagnosis Date   Atrial fibrillation (HCC)    Breast cancer (HCC) 1192   LUMPECTOMY   Cancer (HCC)    Coronary artery disease    Hyperlipidemia    Hypertension    Syncope and collapse      SURGICAL HISTORY   Past Surgical History:  Procedure Laterality Date   BREAST LUMPECTOMY  1992   LEFT BREAST   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   FASCUCUTANEOUS GRAFT     FAMILY HISTORY   Family History  Problem Relation Age of Onset   CVA Mother    CAD Father    CVA Father    Heart attack Sister    Hypertension Brother      SOCIAL HISTORY   Social History   Tobacco Use   Smoking status: Never  Smokeless tobacco: Never  Substance Use Topics   Alcohol use: No    Alcohol/week: 0.0 standard drinks of alcohol   Drug use: No     MEDICATIONS    Current Medication:  Current Facility-Administered Medications:    acetaminophen (TYLENOL) tablet 650 mg, 650 mg, Oral, Q6H PRN, 650 mg at 02/19/23 1510 **OR** acetaminophen (TYLENOL) suppository 650 mg, 650 mg, Rectal, Q6H PRN, Nolberto Hanlon, MD   amiodarone (NEXTERONE PREMIX) 360-4.14 MG/200ML-% (1.8 mg/mL) IV infusion, 30 mg/hr, Intravenous, Continuous, Harlon Ditty D, NP, Last Rate: 16.67 mL/hr at 03/03/2023 0658, 30 mg/hr at 03/20/2023 0658   lip balm (BLISTEX) ointment 1 Application, 1 Application, Topical, PRN, Amin, Ankit Chirag, MD   LORazepam (ATIVAN) injection 1 mg, 1 mg, Intravenous, Q4H PRN, Theotis Burrow, NP, 1 mg at 02/21/23 2240   morphine (PF) 2 MG/ML injection 1-2 mg, 1-2 mg, Intravenous, Q1H PRN, Theotis Burrow, NP   Muscle Rub CREA 1 Application, 1 Application, Topical, PRN, Amin, Ankit Chirag, MD   ondansetron (ZOFRAN) injection 4 mg, 4 mg, Intravenous, Q6H PRN, Harlon Ditty D, NP, 4 mg at 02/20/23 1947   Oral care mouth rinse, 15 mL, Mouth Rinse, 4 times per day, Arnetha Courser, MD, 15 mL at 02/21/23 2104   Oral care mouth rinse, 15 mL, Mouth Rinse, PRN, Arnetha Courser, MD   phenol (CHLORASEPTIC) mouth spray 1 spray, 1 spray, Mouth/Throat, PRN, Amin, Ankit Chirag, MD, 1 spray at 02/21/23 0954   polyvinyl alcohol (LIQUIFILM TEARS) 1.4 % ophthalmic solution 1 drop, 1 drop, Both Eyes, PRN, Amin, Ankit Chirag, MD   prochlorperazine (COMPAZINE) tablet 5 mg, 5 mg, Oral, Q8H PRN, Amin, Ankit Chirag, MD   sodium chloride (OCEAN) 0.65 % nasal spray 1 spray, 1 spray, Each Nare, PRN, Amin, Ankit Chirag, MD   sodium chloride flush (NS) 0.9 % injection 10-40 mL, 10-40 mL, Intracatheter, Q12H, Amin, Tilman Neat, MD, 10 mL at 02/21/23 2241   sodium chloride flush (NS) 0.9 % injection 10-40 mL, 10-40 mL, Intracatheter, PRN, Arnetha Courser, MD    ALLERGIES   Diltiazem, Hydrochlorothiazide, Chlorthalidone, Pravastatin, Venlafaxine, Doxazosin, Enalapril, Losartan, and  Mometasone    REVIEW OF SYSTEMS     10 point ROS unable to obtain due to encephalopathy  PHYSICAL EXAMINATION   Vital Signs: Temp:  [97.6 F (36.4 C)-98.2 F (36.8 C)] 98.2 F (36.8 C) (06/02 0701) Pulse Rate:  [66-129] 66 (06/02 0600) Resp:  [20-29] 27 (06/02 0600) BP: (75-138)/(47-115) 133/98 (06/02 0509) SpO2:  [81 %-100 %] 88 % (06/02 0739) FiO2 (%):  [100 %] 100 % (06/02 0739)  GENERAL:age appropriate mild distress HEAD: Normocephalic, atraumatic.  EYES: Pupils equal, round, reactive to light.  No scleral icterus.  MOUTH: Moist mucosal membrane. NECK: Supple. No thyromegaly. No nodules. No JVD.  PULMONARY: rhonchi bilaterally  CARDIOVASCULAR: S1 and S2. Regular rate and rhythm. No murmurs, rubs, or gallops.  GASTROINTESTINAL: Soft, nontender, non-distended. No masses. Positive bowel sounds. No hepatosplenomegaly.  MUSCULOSKELETAL: No swelling, clubbing, or edema.  NEUROLOGIC: Mild distress due to acute illness with encephalopathy SKIN:intact,warm,dry   PERTINENT DATA     Infusions:  amiodarone 30 mg/hr (03/04/2023 1610)   Scheduled Medications:  mouth rinse  15 mL Mouth Rinse 4 times per day   sodium chloride flush  10-40 mL Intracatheter Q12H   PRN Medications: acetaminophen **OR** acetaminophen, lip balm, LORazepam, morphine injection, Muscle Rub, ondansetron (ZOFRAN) IV, mouth rinse, phenol, polyvinyl alcohol, prochlorperazine, sodium chloride, sodium chloride flush Hemodynamic parameters:   Intake/Output: 06/01 0701 -  06/02 0700 In: 428.5 [P.O.:30; I.V.:398.5] Out: 625 [Urine:325]  Ventilator  Settings: FiO2 (%):  [100 %] 100 %     LAB RESULTS:  Basic Metabolic Panel: Recent Labs  Lab 02/06/2023 1959 02/19/23 0555 02/19/23 1007 02/20/23 0258 02/20/23 2055 02/21/23 0305  NA 133* 136  --  133*  --  132*  K 2.9* 3.2*  --  4.2 3.7 3.7  CL 100 104  --  104  --  99  CO2 23 22  --  22  --  25  GLUCOSE 172* 127*  --  138*  --  146*  BUN 13 15   --  22  --  22  CREATININE 0.81 0.83  --  0.91  --  0.84  CALCIUM 8.5* 8.1*  --  8.0*  --  8.0*  MG 2.0  --   --  2.0 1.9 1.9  PHOS  --   --  3.7  --   --  3.0    Liver Function Tests: Recent Labs  Lab 01/26/2023 1959  AST 31  ALT 15  ALKPHOS 71  BILITOT 1.6*  PROT 6.8  ALBUMIN 3.5    No results for input(s): "LIPASE", "AMYLASE" in the last 168 hours. No results for input(s): "AMMONIA" in the last 168 hours. CBC: Recent Labs  Lab 01/22/2023 1959 02/19/23 0555 02/20/23 0258 02/21/23 0305  WBC 18.1* 21.1* 21.9* 21.5*  NEUTROABS 16.3*  --   --   --   HGB 11.5* 10.4* 9.9* 10.2*  HCT 34.9* 31.9* 30.3* 31.4*  MCV 92.3 93.3 91.8 92.9  PLT 267 256 224 246    Cardiac Enzymes: No results for input(s): "CKTOTAL", "CKMB", "CKMBINDEX", "TROPONINI" in the last 168 hours. BNP: Invalid input(s): "POCBNP" CBG: Recent Labs  Lab 02/20/23 1124 02/20/23 1606 02/20/23 2147 02/21/23 0735 02/21/23 1121  GLUCAP 120* 111* 143* 123* 110*        IMAGING RESULTS:  Imaging: ECHOCARDIOGRAM COMPLETE  Result Date: 02/21/2023    ECHOCARDIOGRAM REPORT   Patient Name:   Meridee Score Date of Exam: 02/21/2023 Medical Rec #:  [id number]    Height:       65.0 in Accession #:    [id number]   Weight:       134.0 lb Date of Birth:  1931/11/26    BSA:          1.669 m Patient Age:    89 years     BP:           117/90 mmHg Patient Gender: F            HR:           136 bpm. Exam Location:  [facility] Procedure: 2D Echo Indications:     CHF I50.21  History:         Patient has no prior history of Echocardiogram examinations.  Sonographer:     Overton Mam RDCS, FASE Referring Phys:  [id number] Judithe Modest Diagnosing Phys: Chilton Si MD IMPRESSIONS  1. Left ventricular ejection fraction, by estimation, is 30 to 35%. Left ventricular ejection fraction by 2D MOD biplane is 31.5 %. The left ventricle has moderately decreased function. The left ventricle demonstrates global hypokinesis. There is moderate  concentric left ventricular hypertrophy. Left ventricular diastolic parameters are indeterminate. Elevated left ventricular end-diastolic pressure.  2. Right ventricular systolic function is normal. The right ventricular size is normal. There is moderately elevated pulmonary artery systolic pressure.  3. Left atrial size was  moderately dilated.  4. The mitral valve is normal in structure. Mild mitral valve regurgitation. No evidence of mitral stenosis.  5. The aortic valve is tricuspid. Aortic valve regurgitation is not visualized. No aortic stenosis is present.  6. The inferior vena cava is normal in size with <50% respiratory variability, suggesting right atrial pressure of 8 mmHg. FINDINGS  Left Ventricle: Left ventricular ejection fraction, by estimation, is 30 to 35%. Left ventricular ejection fraction by 2D MOD biplane is 31.5 %. The left ventricle has moderately decreased function. The left ventricle demonstrates global hypokinesis. The left ventricular internal cavity size was normal in size. There is moderate concentric left ventricular hypertrophy. Abnormal (paradoxical) septal motion, consistent with left bundle branch block. Left ventricular diastolic parameters are indeterminate. Elevated left ventricular end-diastolic pressure. Right Ventricle: The right ventricular size is normal. No increase in right ventricular wall thickness. Right ventricular systolic function is normal. There is moderately elevated pulmonary artery systolic pressure. The tricuspid regurgitant velocity is 3.11 m/s, and with an assumed right atrial pressure of 8 mmHg, the estimated right ventricular systolic pressure is 46.7 mmHg. Left Atrium: Left atrial size was moderately dilated. Right Atrium: Right atrial size was normal in size. Pericardium: There is no evidence of pericardial effusion. Mitral Valve: The mitral valve is normal in structure. Mild mitral annular calcification. Mild mitral valve regurgitation. No evidence of  mitral valve stenosis. Tricuspid Valve: The tricuspid valve is normal in structure. Tricuspid valve regurgitation is mild . No evidence of tricuspid stenosis. Aortic Valve: The aortic valve is tricuspid. Aortic valve regurgitation is not visualized. No aortic stenosis is present. Aortic valve peak gradient measures 4.3 mmHg. Pulmonic Valve: The pulmonic valve was normal in structure. Pulmonic valve regurgitation is trivial. No evidence of pulmonic stenosis. Aorta: The aortic root is normal in size and structure. Venous: The inferior vena cava is normal in size with less than 50% respiratory variability, suggesting right atrial pressure of 8 mmHg. IAS/Shunts: No atrial level shunt detected by color flow Doppler. Additional Comments: A device lead is visualized. There is a small pleural effusion in the left lateral region.  LEFT VENTRICLE PLAX 2D                        Biplane EF (MOD) LVIDd:         4.80 cm         LV Biplane EF:   Left LVIDs:         3.80 cm                          ventricular LV PW:         1.40 cm                          ejection LV IVS:        1.40 cm                          fraction by LVOT diam:     1.80 cm                          2D MOD LV SV:         26  biplane is LV SV Index:   15                               31.5 %. LVOT Area:     2.54 cm                                Diastology                                LV e' medial:   6.42 cm/s LV Volumes (MOD)               LV E/e' medial: 22.1 LV vol d, MOD    56.0 ml A2C: LV vol d, MOD    52.3 ml A4C: LV vol s, MOD    34.4 ml A2C: LV vol s, MOD    40.5 ml A4C: LV SV MOD A2C:   21.6 ml LV SV MOD A4C:   52.3 ml LV SV MOD BP:    17.6 ml RIGHT VENTRICLE RV Basal diam:  3.20 cm RV S prime:     11.40 cm/s TAPSE (M-mode): 1.5 cm LEFT ATRIUM             Index        RIGHT ATRIUM           Index LA diam:        4.60 cm 2.76 cm/m   RA Area:     14.10 cm LA Vol (A2C):   70.0 ml 41.95 ml/m  RA Volume:   33.60 ml  20.13  ml/m LA Vol (A4C):   52.6 ml 31.52 ml/m LA Biplane Vol: 64.2 ml 38.47 ml/m  AORTIC VALVE                 PULMONIC VALVE AV Area (Vmax): 1.65 cm     PV Vmax:       0.85 m/s AV Vmax:        104.00 cm/s  PV Peak grad:  2.9 mmHg AV Peak Grad:   4.3 mmHg LVOT Vmax:      67.30 cm/s LVOT Vmean:     40.800 cm/s LVOT VTI:       0.101 m  AORTA Ao Root diam: 3.10 cm Ao Asc diam:  2.80 cm MITRAL VALVE                  TRICUSPID VALVE MV Area (PHT): 5.27 cm       TR Peak grad:   38.7 mmHg MV Decel Time: 144 msec       TR Vmax:        311.00 cm/s MR Peak grad:    83.2 mmHg MR Mean grad:    48.0 mmHg    SHUNTS MR Vmax:         456.00 cm/s  Systemic VTI:  0.10 m MR Vmean:        327.0 cm/s   Systemic Diam: 1.80 cm MR PISA:         2.26 cm MR PISA Eff ROA: 15 mm MR PISA Radius:  0.60 cm MV E velocity: 142.00 cm/s Chilton Si MD Electronically signed by Chilton Si MD Signature Date/Time: 02/21/2023/1:58:51 PM    Final    DG Chest Port 1 View  Result Date: 02/21/2023 CLINICAL DATA:  [id number] with acute respiratory failure with hypoxia. EXAM: PORTABLE CHEST 1 VIEW COMPARISON:  Portable chest yesterday at 4:09 p.m. FINDINGS: 5:04 a.m. Right chest dual lead pacing system and wire insertions are unaltered as well as multiple overlying monitor wires. Extensive diffuse dense bilateral airspace disease with underlying interstitial prominence shows no interval improvement or worsening. There are small pleural effusions also unchanged. The heart is enlarged. The central vasculature is obscured by the airspace disease. There is calcification of the transverse aorta with stable mediastinum. Old left axillary surgical clips. Mild osteopenia and degenerative change.  No acute osseous findings. IMPRESSION: No interval improvement or worsening of diffuse dense bilateral airspace disease with underlying interstitial prominence. Stable small pleural effusions. Stable cardiomegaly. Electronically Signed   By: Almira Bar M.D.   On:  02/21/2023 07:02   DG Chest Port 1 View  Result Date: 02/20/2023 CLINICAL DATA:  [id number] Pleural effusion [id number] status post right thoracentesis EXAM: PORTABLE CHEST 1 VIEW COMPARISON:  Multiple priors FINDINGS: Cardiomediastinal silhouette is obscured by diffuse alveolar airspace disease. Findings are not significantly changed. No significant pleural effusion. No pneumothorax. No acute osseous abnormality. There is a 2 lead implantable cardiac device. Surgical clips overlie the left chest. IMPRESSION: No findings of pneumothorax following right thoracentesis. Similar pattern of diffuse alveolar airspace disease. Electronically Signed   By: Olive Bass M.D.   On: 02/20/2023 16:38   US THORACENTESIS ASP PLEURAL SPACE W/IMG GUIDE  Result Date: 02/20/2023 INDICATION: 89 year old with history of lung cancer with mets admitted for pneumonia found to have bilateral moderate pleural effusions. Request received for diagnostic and therapeutic right thoracentesis. EXAM: ULTRASOUND GUIDED DIAGNOSTIC AND THERAPEUTIC RIGHT THORACENTESIS MEDICATIONS: 10 mL 1 % lidocaine COMPLICATIONS: None immediate. PROCEDURE: An ultrasound guided thoracentesis was thoroughly discussed with the patient and questions answered. The benefits, risks, alternatives and complications were also discussed. The patient understands and wishes to proceed with the procedure. Written consent was obtained. Ultrasound was performed to localize and mark an adequate pocket of fluid in the right chest. The area was then prepped and draped in the normal sterile fashion. 1% Lidocaine was used for local anesthesia. Under ultrasound guidance a 6 Fr Safe-T-Centesis catheter was introduced. Thoracentesis was performed. The catheter was removed and a dressing applied. FINDINGS: A total of approximately 450 cc of hazy, amber fluid was removed. Samples were sent to the laboratory as requested by the clinical team. IMPRESSION: Successful ultrasound guided right  thoracentesis yielding 450 cc of pleural fluid. Procedure was performed by Alex Gardener, NP and supervised by Olive Bass, MD Electronically Signed   By: Olive Bass M.D.   On: 02/20/2023 16:15   DG Chest Port 1 View  Result Date: 02/20/2023 CLINICAL DATA:  Dyspnea EXAM: PORTABLE CHEST 1 VIEW COMPARISON:  Feb 19, 2023. FINDINGS: Progressive extensive interstitial and airspace opacities throughout both lungs. Enlarged cardiac silhouette, partially obscured. Right subclavian cardiac rhythm maintenance device. No visible pneumothorax or large pleural effusions. Polyarticular degenerative change. IMPRESSION: Progressive extensive interstitial and airspace opacities throughout both lungs, most likely edema. Superimposed infection is not excluded. Electronically Signed   By: Feliberto Harts M.D.   On: 02/20/2023 11:40   @PROBHOSP @ ECHOCARDIOGRAM COMPLETE  Result Date: 02/21/2023    ECHOCARDIOGRAM REPORT   Patient Name:   Daisy Parks Date of Exam: 02/21/2023 Medical Rec #:  [id number]    Height:       65.0 in Accession #:    [id number]   Weight:  134.0 lb Date of Birth:  [birth year]    BSA:          1.669 m Patient Age:    67 years     BP:           117/90 mmHg Patient Gender: F            HR:           136 bpm. Exam Location:  [facility] Procedure: 2D Echo Indications:     CHF I50.21  History:         Patient has no prior history of Echocardiogram examinations.  Sonographer:     Overton Mam RDCS, FASE Referring Phys:  [id number] Judithe Modest Diagnosing Phys: Chilton Si MD IMPRESSIONS  1. Left ventricular ejection fraction, by estimation, is 30 to 35%. Left ventricular ejection fraction by 2D MOD biplane is 31.5 %. The left ventricle has moderately decreased function. The left ventricle demonstrates global hypokinesis. There is moderate concentric left ventricular hypertrophy. Left ventricular diastolic parameters are indeterminate. Elevated left ventricular end-diastolic pressure.  2. Right  ventricular systolic function is normal. The right ventricular size is normal. There is moderately elevated pulmonary artery systolic pressure.  3. Left atrial size was moderately dilated.  4. The mitral valve is normal in structure. Mild mitral valve regurgitation. No evidence of mitral stenosis.  5. The aortic valve is tricuspid. Aortic valve regurgitation is not visualized. No aortic stenosis is present.  6. The inferior vena cava is normal in size with <50% respiratory variability, suggesting right atrial pressure of 8 mmHg. FINDINGS  Left Ventricle: Left ventricular ejection fraction, by estimation, is 30 to 35%. Left ventricular ejection fraction by 2D MOD biplane is 31.5 %. The left ventricle has moderately decreased function. The left ventricle demonstrates global hypokinesis. The left ventricular internal cavity size was normal in size. There is moderate concentric left ventricular hypertrophy. Abnormal (paradoxical) septal motion, consistent with left bundle branch block. Left ventricular diastolic parameters are indeterminate. Elevated left ventricular end-diastolic pressure. Right Ventricle: The right ventricular size is normal. No increase in right ventricular wall thickness. Right ventricular systolic function is normal. There is moderately elevated pulmonary artery systolic pressure. The tricuspid regurgitant velocity is 3.11 m/s, and with an assumed right atrial pressure of 8 mmHg, the estimated right ventricular systolic pressure is 46.7 mmHg. Left Atrium: Left atrial size was moderately dilated. Right Atrium: Right atrial size was normal in size. Pericardium: There is no evidence of pericardial effusion. Mitral Valve: The mitral valve is normal in structure. Mild mitral annular calcification. Mild mitral valve regurgitation. No evidence of mitral valve stenosis. Tricuspid Valve: The tricuspid valve is normal in structure. Tricuspid valve regurgitation is mild . No evidence of tricuspid stenosis.  Aortic Valve: The aortic valve is tricuspid. Aortic valve regurgitation is not visualized. No aortic stenosis is present. Aortic valve peak gradient measures 4.3 mmHg. Pulmonic Valve: The pulmonic valve was normal in structure. Pulmonic valve regurgitation is trivial. No evidence of pulmonic stenosis. Aorta: The aortic root is normal in size and structure. Venous: The inferior vena cava is normal in size with less than 50% respiratory variability, suggesting right atrial pressure of 8 mmHg. IAS/Shunts: No atrial level shunt detected by color flow Doppler. Additional Comments: A device lead is visualized. There is a small pleural effusion in the left lateral region.  LEFT VENTRICLE PLAX 2D  Biplane EF (MOD) LVIDd:         4.80 cm         LV Biplane EF:   Left LVIDs:         3.80 cm                          ventricular LV PW:         1.40 cm                          ejection LV IVS:        1.40 cm                          fraction by LVOT diam:     1.80 cm                          2D MOD LV SV:         26                               biplane is LV SV Index:   15                               31.5 %. LVOT Area:     2.54 cm                                Diastology                                LV e' medial:   6.42 cm/s LV Volumes (MOD)               LV E/e' medial: 22.1 LV vol d, MOD    56.0 ml A2C: LV vol d, MOD    52.3 ml A4C: LV vol s, MOD    34.4 ml A2C: LV vol s, MOD    40.5 ml A4C: LV SV MOD A2C:   21.6 ml LV SV MOD A4C:   52.3 ml LV SV MOD BP:    17.6 ml RIGHT VENTRICLE RV Basal diam:  3.20 cm RV S prime:     11.40 cm/s TAPSE (M-mode): 1.5 cm LEFT ATRIUM             Index        RIGHT ATRIUM           Index LA diam:        4.60 cm 2.76 cm/m   RA Area:     14.10 cm LA Vol (A2C):   70.0 ml 41.95 ml/m  RA Volume:   33.60 ml  20.13 ml/m LA Vol (A4C):   52.6 ml 31.52 ml/m LA Biplane Vol: 64.2 ml 38.47 ml/m  AORTIC VALVE                 PULMONIC VALVE AV Area (Vmax): 1.65 cm     PV  Vmax:       0.85 m/s AV Vmax:        104.00 cm/s  PV Peak grad:  2.9 mmHg AV Peak Grad:   4.3  mmHg LVOT Vmax:      67.30 cm/s LVOT Vmean:     40.800 cm/s LVOT VTI:       0.101 m  AORTA Ao Root diam: 3.10 cm Ao Asc diam:  2.80 cm MITRAL VALVE                  TRICUSPID VALVE MV Area (PHT): 5.27 cm       TR Peak grad:   38.7 mmHg MV Decel Time: 144 msec       TR Vmax:        311.00 cm/s MR Peak grad:    83.2 mmHg MR Mean grad:    48.0 mmHg    SHUNTS MR Vmax:         456.00 cm/s  Systemic VTI:  0.10 m MR Vmean:        327.0 cm/s   Systemic Diam: 1.80 cm MR PISA:         2.26 cm MR PISA Eff ROA: 15 mm MR PISA Radius:  0.60 cm MV E velocity: 142.00 cm/s Chilton Si MD Electronically signed by Chilton Si MD Signature Date/Time: 02/21/2023/1:58:51 PM    Final         ASSESSMENT AND PLAN    -Multidisciplinary rounds held today  Acute Hypoxic Respiratory Failure -due to bilateral effusions with compressive atelectasis and interstitial edema - suspect cardiac etiology due to elevated cardiac biomarkers however cannot rule out sepsis due to + blood cultures with staph and also on differential is progressive cancer with malignant effusion. -Diagnostic and therapeutic thoracentesis -  -patient is DNR - palliative care is following -continue Bronchodilator Therapy   Acute decompensated systolic CHF with EF <35% -oxygen as needed -Lasix as tolerated -follow up cardiac enzymes as indicated ICU monitoring   ID -continue IV abx as prescibed -follow up cultures  GI/Nutrition GI PROPHYLAXIS as indicated DIET-->TF's as tolerated Constipation protocol as indicated  ENDO - ICU hypoglycemic\Hyperglycemia protocol -check FSBS per protocol   ELECTROLYTES -follow labs as needed -replace as needed -pharmacy consultation   DVT/GI PRX ordered -SCDs  TRANSFUSIONS AS NEEDED MONITOR FSBS ASSESS the need for LABS as needed  Critical care provider statement:   Total critical care time:  33 minutes   Performed by: Karna Christmas MD   Critical care time was exclusive of separately billable procedures and treating other patients.   Critical care was necessary to treat or prevent imminent or life-threatening deterioration.   Critical care was time spent personally by me on the following activities: development of treatment plan with patient and/or surrogate as well as nursing, discussions with consultants, evaluation of patient's response to treatment, examination of patient, obtaining history from patient or surrogate, ordering and performing treatments and interventions, ordering and review of laboratory studies, ordering and review of radiographic studies, pulse oximetry and re-evaluation of patient's condition.       This document was prepared using Dragon voice recognition software and may include unintentional dictation errors.    Vida Rigger, M.D.  Division of Pulmonary & Critical Care Medicine  [facility]

## 2023-03-23 DEATH — deceased

## 2023-09-09 IMAGING — CR DG CHEST 2V
1 series · 2 of 2 positions shown · non-contrast
Comparison: chest radiograph September 19, 2020

CLINICAL DATA: Chest pain, dizziness and shortness of breath

EXAM:
CHEST - 2 VIEW

[Series 1: dg chest 2 view · 0.14mm/px · 2 of 2 slices shown]
[im 1/2]
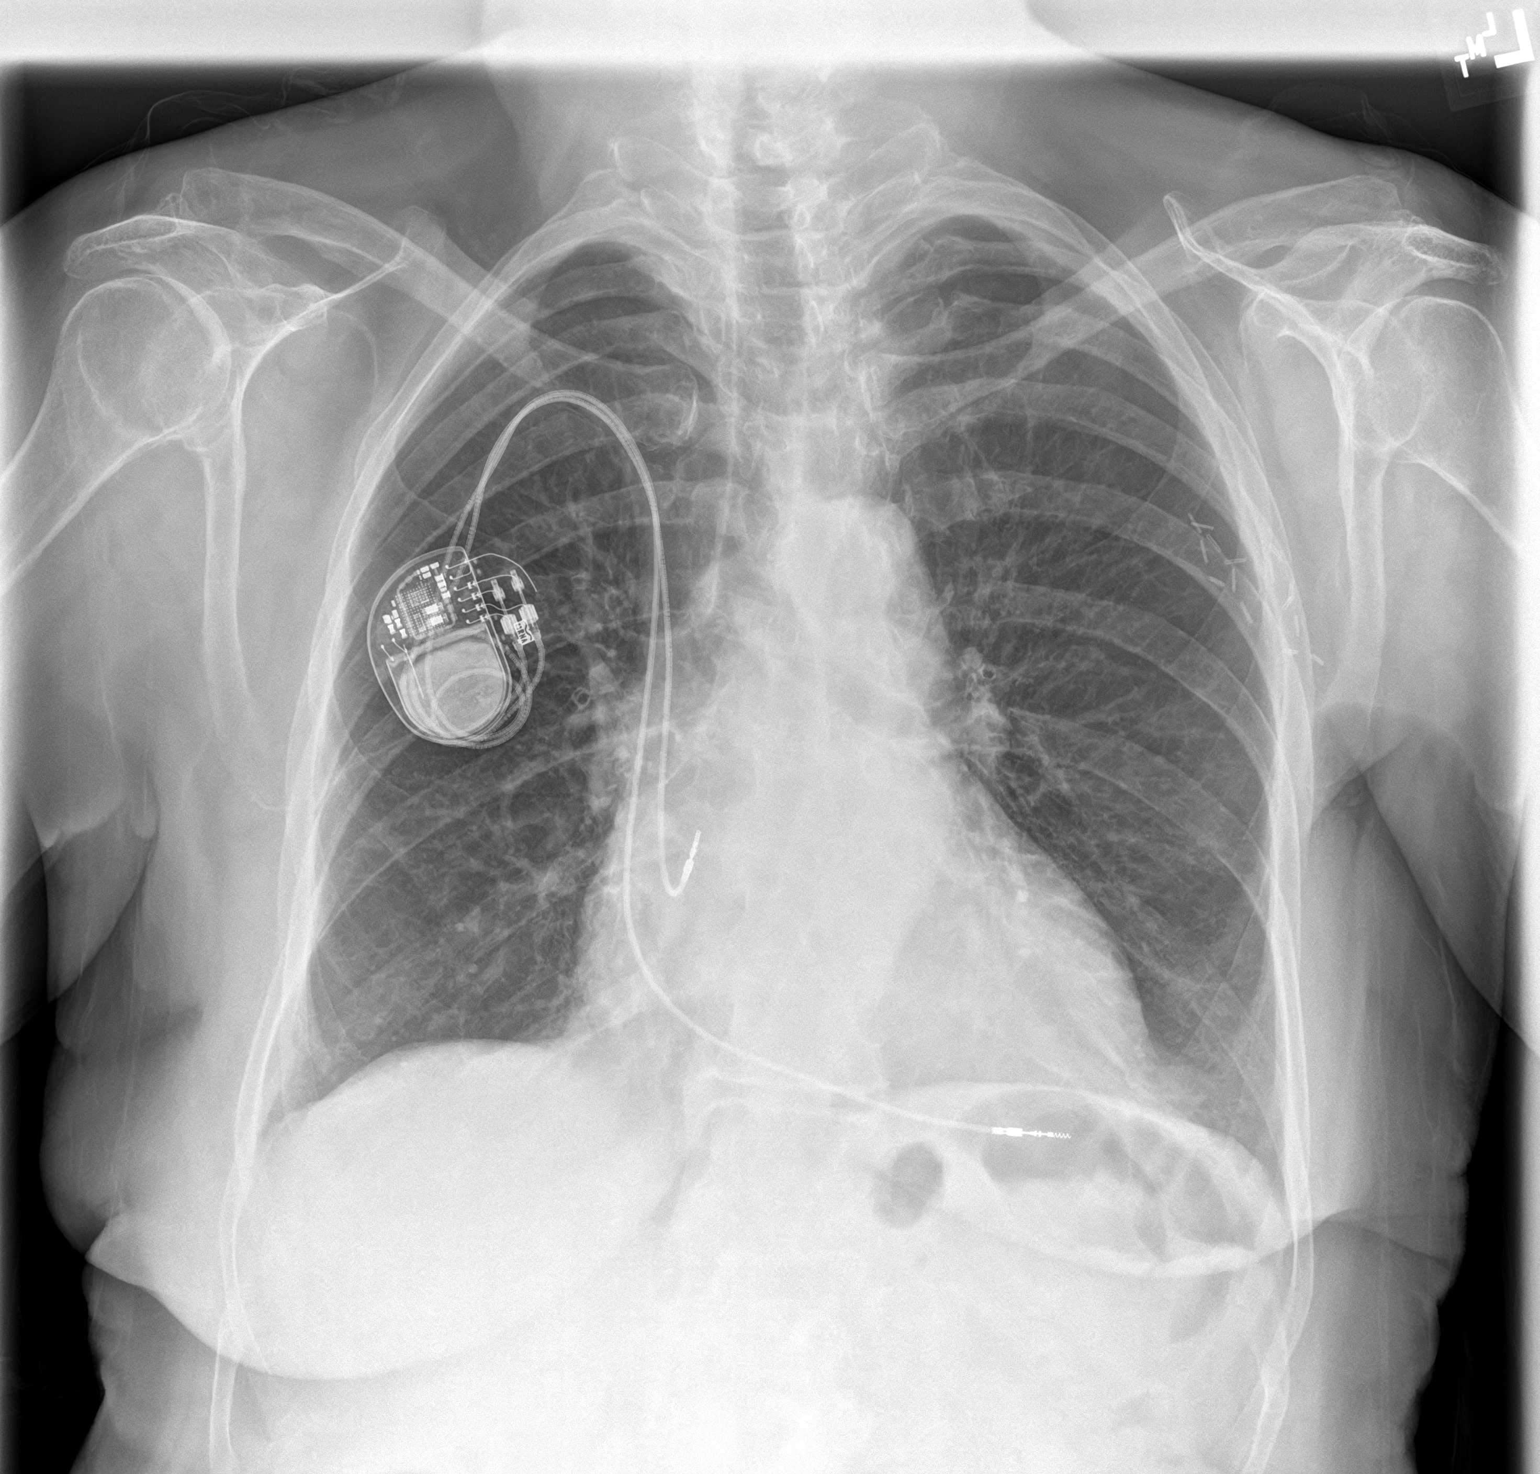
[im 2/2]
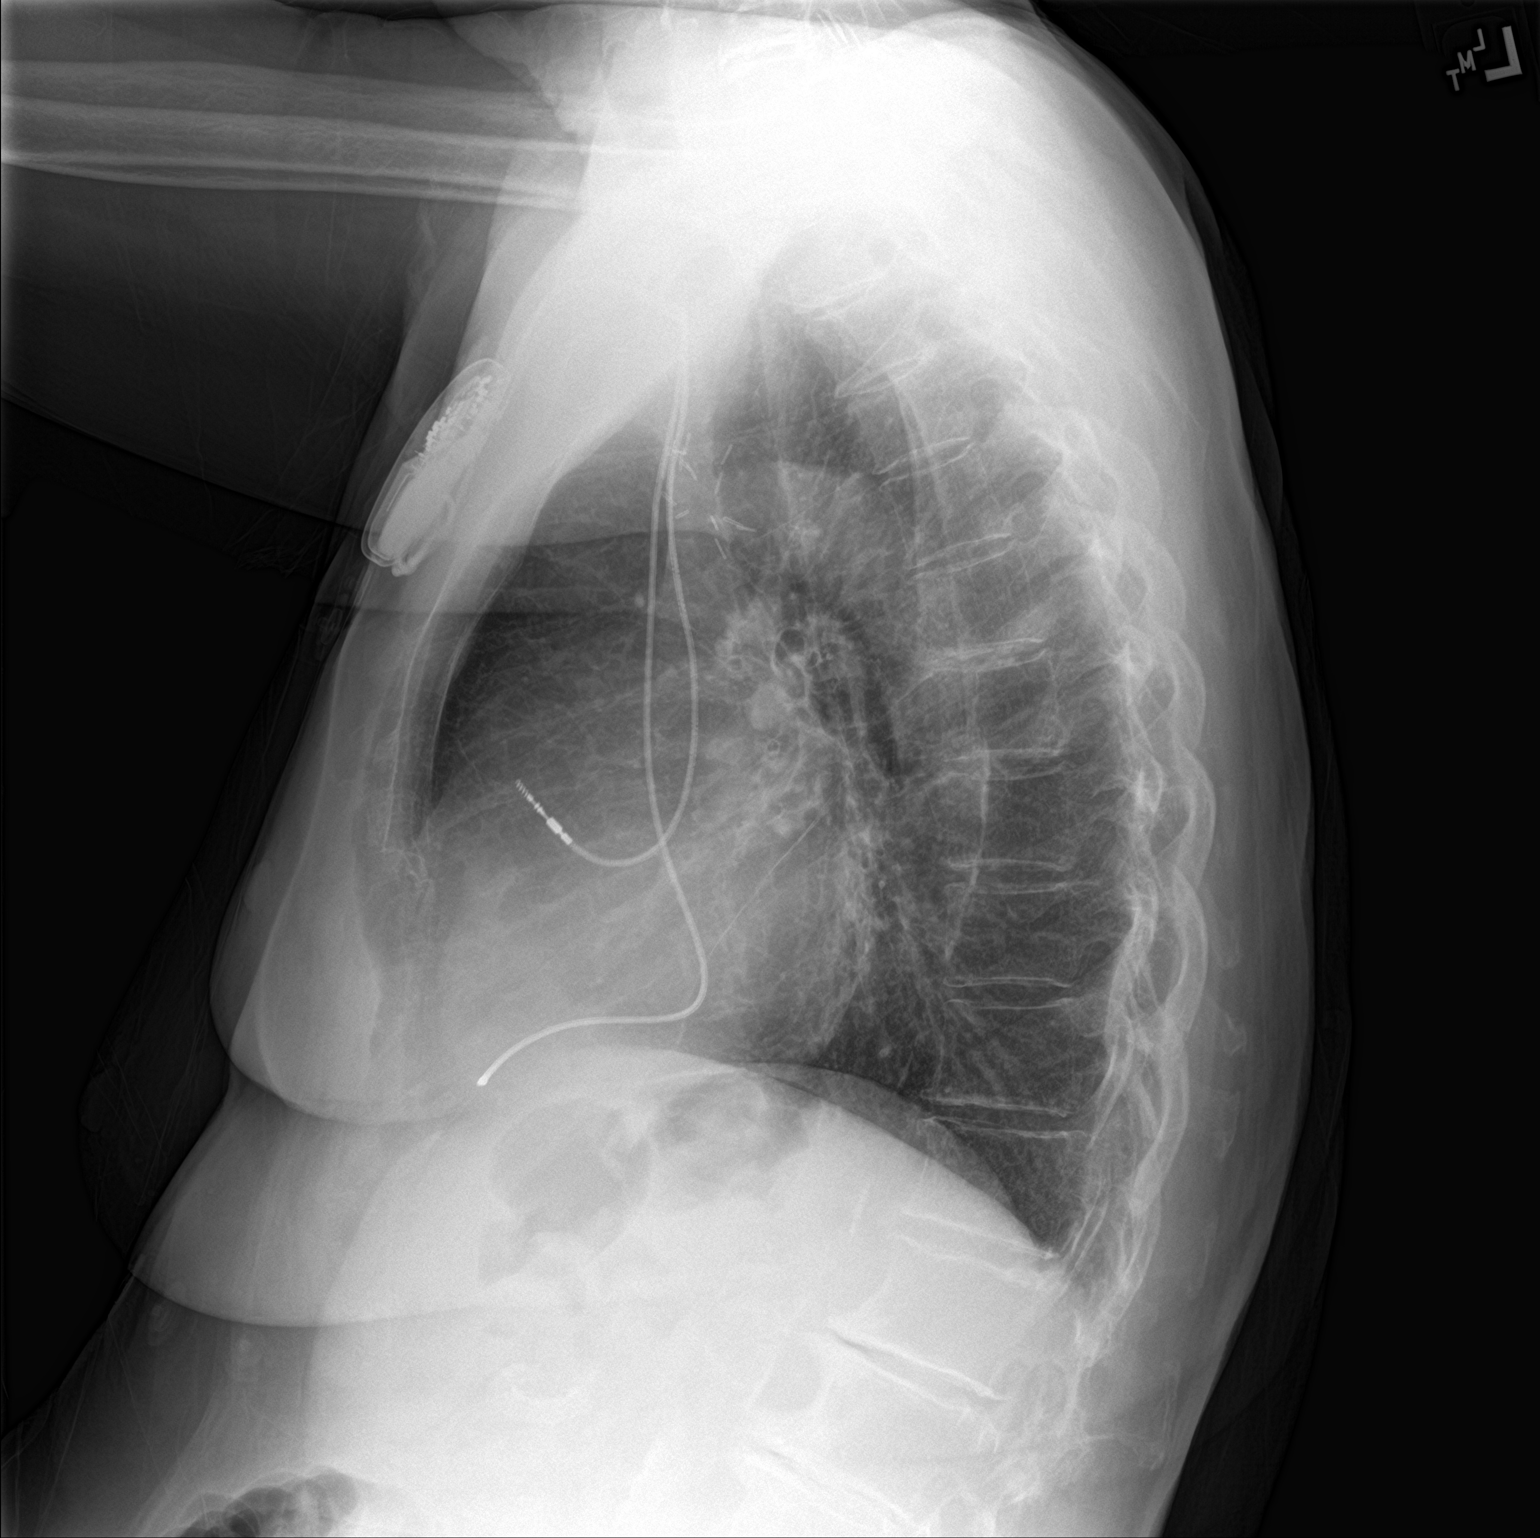

[2 of 2 positions shown; findings below may reference images not displayed]

FINDINGS: Right chest cardiac pacemaker with leads overlying the right atrium
and right ventricle. The heart size and mediastinal contours are
within normal limits. No focal airspace consolidation. No pleural
effusion. No pneumothorax. No acute osseous abnormality. Surgical
clips overlie the left axilla.
IMPRESSION: No acute cardiopulmonary disease.
